# Patient Record
Sex: Female | Born: 1945 | Race: Black or African American | Hispanic: No | Smoking: Former smoker
Health system: Southern US, Community
[De-identification: ages and names within clinical notes are randomized; demographics above are authoritative.]

## PROBLEM LIST (undated history)

## (undated) DIAGNOSIS — M549 Dorsalgia, unspecified: Secondary | ICD-10-CM

## (undated) DIAGNOSIS — I480 Paroxysmal atrial fibrillation: Secondary | ICD-10-CM

## (undated) DIAGNOSIS — M2550 Pain in unspecified joint: Secondary | ICD-10-CM

## (undated) DIAGNOSIS — I509 Heart failure, unspecified: Secondary | ICD-10-CM

## (undated) DIAGNOSIS — D860 Sarcoidosis of lung: Secondary | ICD-10-CM

## (undated) DIAGNOSIS — I4719 Other supraventricular tachycardia: Secondary | ICD-10-CM

## (undated) DIAGNOSIS — A159 Respiratory tuberculosis unspecified: Secondary | ICD-10-CM

## (undated) DIAGNOSIS — K5900 Constipation, unspecified: Secondary | ICD-10-CM

## (undated) DIAGNOSIS — J449 Chronic obstructive pulmonary disease, unspecified: Secondary | ICD-10-CM

## (undated) DIAGNOSIS — K259 Gastric ulcer, unspecified as acute or chronic, without hemorrhage or perforation: Secondary | ICD-10-CM

## (undated) DIAGNOSIS — R0600 Dyspnea, unspecified: Secondary | ICD-10-CM

## (undated) DIAGNOSIS — N183 Chronic kidney disease, stage 3 unspecified: Secondary | ICD-10-CM

## (undated) DIAGNOSIS — I442 Atrioventricular block, complete: Secondary | ICD-10-CM

## (undated) DIAGNOSIS — E785 Hyperlipidemia, unspecified: Secondary | ICD-10-CM

## (undated) DIAGNOSIS — I10 Essential (primary) hypertension: Secondary | ICD-10-CM

## (undated) DIAGNOSIS — K5790 Diverticulosis of intestine, part unspecified, without perforation or abscess without bleeding: Secondary | ICD-10-CM

## (undated) DIAGNOSIS — I471 Supraventricular tachycardia: Secondary | ICD-10-CM

## (undated) DIAGNOSIS — H209 Unspecified iridocyclitis: Secondary | ICD-10-CM

## (undated) DIAGNOSIS — D649 Anemia, unspecified: Secondary | ICD-10-CM

## (undated) DIAGNOSIS — I639 Cerebral infarction, unspecified: Secondary | ICD-10-CM

## (undated) DIAGNOSIS — Z950 Presence of cardiac pacemaker: Secondary | ICD-10-CM

## (undated) DIAGNOSIS — H269 Unspecified cataract: Secondary | ICD-10-CM

## (undated) DIAGNOSIS — R600 Localized edema: Secondary | ICD-10-CM

## (undated) DIAGNOSIS — G4733 Obstructive sleep apnea (adult) (pediatric): Secondary | ICD-10-CM

## (undated) DIAGNOSIS — I00 Rheumatic fever without heart involvement: Secondary | ICD-10-CM

## (undated) DIAGNOSIS — M069 Rheumatoid arthritis, unspecified: Secondary | ICD-10-CM

## (undated) DIAGNOSIS — I428 Other cardiomyopathies: Secondary | ICD-10-CM

## (undated) DIAGNOSIS — M1990 Unspecified osteoarthritis, unspecified site: Secondary | ICD-10-CM

## (undated) DIAGNOSIS — I429 Cardiomyopathy, unspecified: Secondary | ICD-10-CM

## (undated) DIAGNOSIS — R55 Syncope and collapse: Secondary | ICD-10-CM

## (undated) HISTORY — DX: Cardiomyopathy, unspecified: I42.9

## (undated) HISTORY — DX: Anemia, unspecified: D64.9

## (undated) HISTORY — DX: Rheumatic fever without heart involvement: I00

## (undated) HISTORY — DX: Hyperlipidemia, unspecified: E78.5

## (undated) HISTORY — DX: Other supraventricular tachycardia: I47.19

## (undated) HISTORY — PX: EYE SURGERY: SHX253

## (undated) HISTORY — DX: Pain in unspecified joint: M25.50

## (undated) HISTORY — DX: Other cardiomyopathies: I42.8

## (undated) HISTORY — PX: INSERT / REPLACE / REMOVE PACEMAKER: SUR710

## (undated) HISTORY — DX: Supraventricular tachycardia: I47.1

## (undated) HISTORY — DX: Obstructive sleep apnea (adult) (pediatric): G47.33

## (undated) HISTORY — PX: GLAUCOMA SURGERY: SHX656

## (undated) HISTORY — DX: Atrioventricular block, complete: I44.2

## (undated) HISTORY — DX: Diverticulosis of intestine, part unspecified, without perforation or abscess without bleeding: K57.90

## (undated) HISTORY — DX: Unspecified cataract: H26.9

## (undated) HISTORY — DX: Rheumatoid arthritis, unspecified: M06.9

## (undated) HISTORY — DX: Syncope and collapse: R55

## (undated) HISTORY — DX: Unspecified iridocyclitis: H20.9

## (undated) HISTORY — DX: Dyspnea, unspecified: R06.00

## (undated) HISTORY — DX: Constipation, unspecified: K59.00

## (undated) HISTORY — PX: CARDIAC CATHETERIZATION: SHX172

## (undated) HISTORY — PX: VAGINAL HYSTERECTOMY: SUR661

## (undated) HISTORY — DX: Gastric ulcer, unspecified as acute or chronic, without hemorrhage or perforation: K25.9

## (undated) HISTORY — DX: Localized edema: R60.0

## (undated) HISTORY — PX: TUBAL LIGATION: SHX77

## (undated) HISTORY — DX: Presence of cardiac pacemaker: Z95.0

## (undated) HISTORY — DX: Essential (primary) hypertension: I10

## (undated) HISTORY — DX: Chronic obstructive pulmonary disease, unspecified: J44.9

## (undated) HISTORY — DX: Dorsalgia, unspecified: M54.9

## (undated) HISTORY — DX: Paroxysmal atrial fibrillation: I48.0

---

## 1998-01-13 ENCOUNTER — Inpatient Hospital Stay (HOSPITAL_COMMUNITY): Admission: EM | Admit: 1998-01-13 | Discharge: 1998-01-18 | Payer: Self-pay | Admitting: Emergency Medicine

## 2002-10-10 ENCOUNTER — Encounter: Payer: Self-pay | Admitting: Family Medicine

## 2002-10-10 ENCOUNTER — Ambulatory Visit: Admission: RE | Admit: 2002-10-10 | Discharge: 2002-10-10 | Payer: Self-pay | Admitting: Family Medicine

## 2003-11-06 ENCOUNTER — Other Ambulatory Visit: Admission: RE | Admit: 2003-11-06 | Discharge: 2003-11-06 | Payer: Self-pay | Admitting: Family Medicine

## 2004-11-08 ENCOUNTER — Ambulatory Visit: Payer: Self-pay

## 2004-11-11 ENCOUNTER — Ambulatory Visit: Payer: Self-pay

## 2004-11-18 ENCOUNTER — Ambulatory Visit: Payer: Self-pay | Admitting: Cardiology

## 2004-11-19 ENCOUNTER — Ambulatory Visit: Payer: Self-pay

## 2004-11-25 ENCOUNTER — Ambulatory Visit: Payer: Self-pay | Admitting: *Deleted

## 2004-11-28 ENCOUNTER — Ambulatory Visit: Payer: Self-pay | Admitting: Internal Medicine

## 2004-11-29 ENCOUNTER — Ambulatory Visit: Payer: Self-pay | Admitting: *Deleted

## 2004-12-03 ENCOUNTER — Inpatient Hospital Stay (HOSPITAL_BASED_OUTPATIENT_CLINIC_OR_DEPARTMENT_OTHER): Admission: RE | Admit: 2004-12-03 | Discharge: 2004-12-03 | Payer: Self-pay | Admitting: *Deleted

## 2004-12-03 ENCOUNTER — Ambulatory Visit: Payer: Self-pay | Admitting: *Deleted

## 2004-12-10 ENCOUNTER — Ambulatory Visit: Payer: Self-pay | Admitting: Cardiovascular Disease

## 2004-12-17 ENCOUNTER — Ambulatory Visit: Payer: Self-pay | Admitting: Internal Medicine

## 2004-12-27 ENCOUNTER — Ambulatory Visit: Payer: Self-pay | Admitting: Internal Medicine

## 2005-01-01 ENCOUNTER — Ambulatory Visit: Payer: Self-pay | Admitting: Internal Medicine

## 2005-01-03 ENCOUNTER — Ambulatory Visit: Payer: Self-pay | Admitting: Cardiology

## 2005-01-09 ENCOUNTER — Ambulatory Visit: Payer: Self-pay | Admitting: Family Medicine

## 2005-01-10 ENCOUNTER — Ambulatory Visit: Payer: Self-pay | Admitting: Internal Medicine

## 2005-01-15 ENCOUNTER — Ambulatory Visit (HOSPITAL_COMMUNITY): Admission: RE | Admit: 2005-01-15 | Discharge: 2005-01-15 | Payer: Self-pay | Admitting: Internal Medicine

## 2005-01-15 ENCOUNTER — Ambulatory Visit: Payer: Self-pay | Admitting: Internal Medicine

## 2005-01-17 ENCOUNTER — Ambulatory Visit (HOSPITAL_COMMUNITY): Admission: RE | Admit: 2005-01-17 | Discharge: 2005-01-17 | Payer: Self-pay | Admitting: Internal Medicine

## 2005-01-17 ENCOUNTER — Ambulatory Visit: Payer: Self-pay | Admitting: Internal Medicine

## 2005-01-22 ENCOUNTER — Ambulatory Visit: Payer: Self-pay | Admitting: Internal Medicine

## 2005-01-29 ENCOUNTER — Ambulatory Visit: Payer: Self-pay | Admitting: Cardiology

## 2005-02-05 ENCOUNTER — Ambulatory Visit: Payer: Self-pay | Admitting: *Deleted

## 2005-02-10 ENCOUNTER — Ambulatory Visit: Payer: Self-pay | Admitting: *Deleted

## 2005-02-10 ENCOUNTER — Ambulatory Visit: Payer: Self-pay | Admitting: Internal Medicine

## 2005-02-14 ENCOUNTER — Ambulatory Visit: Payer: Self-pay | Admitting: Cardiology

## 2005-02-14 ENCOUNTER — Ambulatory Visit: Payer: Self-pay | Admitting: Internal Medicine

## 2005-02-17 ENCOUNTER — Ambulatory Visit (HOSPITAL_COMMUNITY): Admission: RE | Admit: 2005-02-17 | Discharge: 2005-02-18 | Payer: Self-pay | Admitting: Internal Medicine

## 2005-02-18 ENCOUNTER — Ambulatory Visit: Payer: Self-pay | Admitting: Internal Medicine

## 2005-02-24 ENCOUNTER — Ambulatory Visit: Payer: Self-pay | Admitting: Cardiology

## 2005-03-04 ENCOUNTER — Ambulatory Visit: Payer: Self-pay | Admitting: Internal Medicine

## 2005-03-24 ENCOUNTER — Ambulatory Visit: Payer: Self-pay | Admitting: Internal Medicine

## 2005-03-24 ENCOUNTER — Ambulatory Visit: Payer: Self-pay | Admitting: Cardiology

## 2005-04-10 ENCOUNTER — Ambulatory Visit: Payer: Self-pay | Admitting: Family Medicine

## 2005-04-11 ENCOUNTER — Ambulatory Visit (HOSPITAL_COMMUNITY): Admission: RE | Admit: 2005-04-11 | Discharge: 2005-04-11 | Payer: Self-pay | Admitting: Family Medicine

## 2005-04-14 ENCOUNTER — Ambulatory Visit: Payer: Self-pay | Admitting: Internal Medicine

## 2005-04-14 ENCOUNTER — Ambulatory Visit: Payer: Self-pay | Admitting: Cardiology

## 2005-04-23 ENCOUNTER — Ambulatory Visit: Payer: Self-pay | Admitting: Internal Medicine

## 2005-04-25 ENCOUNTER — Ambulatory Visit (HOSPITAL_COMMUNITY): Admission: RE | Admit: 2005-04-25 | Discharge: 2005-04-25 | Payer: Self-pay | Admitting: Internal Medicine

## 2005-05-12 ENCOUNTER — Ambulatory Visit: Payer: Self-pay | Admitting: Internal Medicine

## 2005-05-12 ENCOUNTER — Ambulatory Visit: Payer: Self-pay | Admitting: Cardiology

## 2005-05-21 ENCOUNTER — Ambulatory Visit: Payer: Self-pay | Admitting: Cardiology

## 2005-05-28 ENCOUNTER — Ambulatory Visit: Payer: Self-pay | Admitting: Cardiology

## 2005-06-05 ENCOUNTER — Ambulatory Visit: Payer: Self-pay | Admitting: Internal Medicine

## 2005-06-07 ENCOUNTER — Emergency Department (HOSPITAL_COMMUNITY): Admission: EM | Admit: 2005-06-07 | Discharge: 2005-06-07 | Payer: Self-pay | Admitting: Emergency Medicine

## 2005-06-11 ENCOUNTER — Ambulatory Visit: Payer: Self-pay | Admitting: Cardiology

## 2005-06-25 ENCOUNTER — Ambulatory Visit: Payer: Self-pay | Admitting: Cardiology

## 2005-07-14 ENCOUNTER — Ambulatory Visit: Payer: Self-pay

## 2005-07-16 ENCOUNTER — Ambulatory Visit: Payer: Self-pay | Admitting: Cardiology

## 2005-08-13 ENCOUNTER — Ambulatory Visit: Payer: Self-pay | Admitting: Cardiology

## 2005-08-27 ENCOUNTER — Ambulatory Visit: Payer: Self-pay | Admitting: Cardiology

## 2005-09-08 ENCOUNTER — Ambulatory Visit: Payer: Self-pay | Admitting: Cardiology

## 2005-09-22 ENCOUNTER — Ambulatory Visit: Payer: Self-pay | Admitting: Cardiology

## 2005-10-07 ENCOUNTER — Ambulatory Visit: Payer: Self-pay | Admitting: *Deleted

## 2005-10-28 ENCOUNTER — Ambulatory Visit: Payer: Self-pay | Admitting: Cardiology

## 2005-11-07 ENCOUNTER — Ambulatory Visit: Payer: Self-pay | Admitting: Cardiology

## 2005-11-26 ENCOUNTER — Ambulatory Visit: Payer: Self-pay | Admitting: Cardiology

## 2005-11-28 ENCOUNTER — Emergency Department (HOSPITAL_COMMUNITY): Admission: EM | Admit: 2005-11-28 | Discharge: 2005-11-28 | Payer: Self-pay | Admitting: Emergency Medicine

## 2005-12-24 ENCOUNTER — Ambulatory Visit: Payer: Self-pay | Admitting: Cardiology

## 2006-01-01 ENCOUNTER — Ambulatory Visit: Payer: Self-pay | Admitting: Internal Medicine

## 2006-06-22 ENCOUNTER — Ambulatory Visit: Payer: Self-pay | Admitting: Internal Medicine

## 2006-11-27 ENCOUNTER — Ambulatory Visit: Payer: Self-pay | Admitting: Internal Medicine

## 2007-11-12 ENCOUNTER — Ambulatory Visit: Payer: Self-pay | Admitting: Internal Medicine

## 2008-01-17 ENCOUNTER — Encounter (INDEPENDENT_AMBULATORY_CARE_PROVIDER_SITE_OTHER): Payer: Self-pay | Admitting: Internal Medicine

## 2008-02-09 ENCOUNTER — Other Ambulatory Visit: Admission: RE | Admit: 2008-02-09 | Discharge: 2008-02-09 | Payer: Self-pay | Admitting: Family Medicine

## 2008-05-24 ENCOUNTER — Ambulatory Visit: Payer: Self-pay | Admitting: Cardiology

## 2008-12-07 ENCOUNTER — Encounter: Payer: Self-pay | Admitting: Internal Medicine

## 2008-12-12 ENCOUNTER — Ambulatory Visit: Payer: Self-pay | Admitting: Internal Medicine

## 2008-12-12 LAB — CONVERTED CEMR LAB
BUN: 19 mg/dL (ref 6–23)
Basophils Absolute: 0 10*3/uL (ref 0.0–0.1)
Basophils Relative: 0.9 % (ref 0.0–3.0)
CO2: 27 meq/L (ref 19–32)
Calcium: 9.5 mg/dL (ref 8.4–10.5)
Chloride: 106 meq/L (ref 96–112)
Creatinine, Ser: 1.2 mg/dL (ref 0.4–1.2)
Eosinophils Absolute: 0.2 10*3/uL (ref 0.0–0.7)
Eosinophils Relative: 5.2 % — ABNORMAL HIGH (ref 0.0–5.0)
GFR calc Af Amer: 59 mL/min
GFR calc non Af Amer: 49 mL/min
Glucose, Bld: 81 mg/dL (ref 70–99)
HCT: 31.7 % — ABNORMAL LOW (ref 36.0–46.0)
Hemoglobin: 10.9 g/dL — ABNORMAL LOW (ref 12.0–15.0)
Lymphocytes Relative: 24.9 % (ref 12.0–46.0)
MCHC: 34.5 g/dL (ref 30.0–36.0)
MCV: 89.5 fL (ref 78.0–100.0)
Monocytes Absolute: 0.4 10*3/uL (ref 0.1–1.0)
Monocytes Relative: 10.5 % (ref 3.0–12.0)
Neutro Abs: 2 10*3/uL (ref 1.4–7.7)
Neutrophils Relative %: 58.5 % (ref 43.0–77.0)
Platelets: 196 10*3/uL (ref 150–400)
Potassium: 4.2 meq/L (ref 3.5–5.1)
Pro B Natriuretic peptide (BNP): 139 pg/mL — ABNORMAL HIGH (ref 0.0–100.0)
RBC: 3.54 M/uL — ABNORMAL LOW (ref 3.87–5.11)
RDW: 12.7 % (ref 11.5–14.6)
Sodium: 140 meq/L (ref 135–145)
WBC: 3.4 10*3/uL — ABNORMAL LOW (ref 4.5–10.5)

## 2009-03-20 ENCOUNTER — Ambulatory Visit: Payer: Self-pay | Admitting: Internal Medicine

## 2009-03-20 DIAGNOSIS — I495 Sick sinus syndrome: Secondary | ICD-10-CM

## 2009-09-14 ENCOUNTER — Encounter: Payer: Self-pay | Admitting: Internal Medicine

## 2009-09-14 ENCOUNTER — Ambulatory Visit: Payer: Self-pay | Admitting: Internal Medicine

## 2009-09-14 DIAGNOSIS — I5022 Chronic systolic (congestive) heart failure: Secondary | ICD-10-CM

## 2009-09-18 ENCOUNTER — Encounter: Payer: Self-pay | Admitting: Internal Medicine

## 2009-10-09 ENCOUNTER — Ambulatory Visit (HOSPITAL_COMMUNITY): Admission: RE | Admit: 2009-10-09 | Discharge: 2009-10-09 | Payer: Self-pay | Admitting: Internal Medicine

## 2009-10-09 ENCOUNTER — Ambulatory Visit: Payer: Self-pay | Admitting: Internal Medicine

## 2009-10-09 ENCOUNTER — Ambulatory Visit: Payer: Self-pay

## 2009-10-09 ENCOUNTER — Ambulatory Visit: Payer: Self-pay | Admitting: Cardiovascular Disease

## 2009-10-09 ENCOUNTER — Encounter: Payer: Self-pay | Admitting: Internal Medicine

## 2009-10-09 DIAGNOSIS — I442 Atrioventricular block, complete: Secondary | ICD-10-CM | POA: Insufficient documentation

## 2009-10-12 ENCOUNTER — Telehealth: Payer: Self-pay | Admitting: Internal Medicine

## 2009-10-31 ENCOUNTER — Telehealth (INDEPENDENT_AMBULATORY_CARE_PROVIDER_SITE_OTHER): Payer: Self-pay | Admitting: *Deleted

## 2009-11-01 ENCOUNTER — Ambulatory Visit: Payer: Self-pay | Admitting: Internal Medicine

## 2009-11-01 LAB — CONVERTED CEMR LAB
GFR calc non Af Amer: 53.49 mL/min (ref >60)
Magnesium: 1.8 mg/dL (ref 1.5–2.5)
Potassium: 4.3 meq/L (ref 3.5–5.1)
Sodium: 141 meq/L (ref 135–145)

## 2010-05-01 ENCOUNTER — Encounter (INDEPENDENT_AMBULATORY_CARE_PROVIDER_SITE_OTHER): Payer: Self-pay | Admitting: *Deleted

## 2010-05-09 ENCOUNTER — Encounter: Payer: Self-pay | Admitting: Internal Medicine

## 2010-05-09 ENCOUNTER — Ambulatory Visit: Payer: Self-pay

## 2010-08-17 ENCOUNTER — Emergency Department (HOSPITAL_COMMUNITY): Admission: EM | Admit: 2010-08-17 | Discharge: 2010-08-17 | Payer: Self-pay | Admitting: Emergency Medicine

## 2010-11-05 NOTE — Progress Notes (Signed)
   Walk in Patient Form Recieved " Pt. Medicine making her have leg cramps what else can she take?" forwarded to [facility]  October 31, 2009 2:17 PM

## 2010-11-05 NOTE — Letter (Signed)
Summary: Device-Delinquent Check  [facility], Main Office  [address]   [place], [place] [address]   Phone: [phone]  Fax: [phone]     May 01, 2010 MRN: [id number]   [name] [address] [place], [place]  [address]   Dear [name],  According to our records, you have not had your implanted device checked in the recommended period of time.  We are unable to determine appropriate device function without checking your device on a regular basis.  Please call our office to schedule an appointment as soon as possible.  If you are having your device checked by another physician, please call us so that we may update our records.  Thank you,   [facility]

## 2010-11-05 NOTE — Cardiovascular Report (Signed)
Summary: Office Visit   Office Visit   Imported By: Roderic Ovens 05/22/2010 11:33:30  _____________________________________________________________________  External Attachment:    Type:   Image     Comment:   External Document

## 2010-11-05 NOTE — Procedures (Signed)
Summary: pacer check/lg  Medications Added * CRESTOR 10 MG TABS (ROSUVASTATIN CALCIUM) take one tablet at bedtime TRAVATAN Z 0.004 % SOLN (TRAVOPROST) one drop in the right eye daily ASPIRIN 81 MG TBEC (ASPIRIN) one by mouth daily FISH OIL 1000 MG CAPS (OMEGA-3 FATTY ACIDS) one by mouth two times a day LOTEMAX 0.5 % SUSP (LOTEPREDNOL ETABONATE) one drop both eyes two times a day      Allergies Added:   Current Medications (verified): 1)  Crestor 10 Mg Tabs (Rosuvastatin Calcium) .... Take One Tablet At Bedtime 2)  Caltrate 600 1500 Mg Tabs (Calcium Carbonate) .... Take One Tablet Two Times A Day 3)  Coreg 25 Mg Tabs (Carvedilol) .... Two Times A Day 4)  Travatan Z 0.004 % Soln (Travoprost) .... One Drop in The Right Eye Daily 5)  Spironolactone 25 Mg Tabs (Spironolactone) .... Take 1/2 Tablet Daily 6)  Losartan Potassium 25 Mg Tabs (Losartan Potassium) .... 1/2 Tablet Daily 7)  Aspirin 81 Mg Tbec (Aspirin) .... One By Mouth Daily 8)  Fish Oil 1000 Mg Caps (Omega-3 Fatty Acids) .... One By Mouth Two Times A Day 9)  Lotemax 0.5 % Susp (Loteprednol Etabonate) .... One Drop Both Eyes Two Times A Day  Allergies (verified): 1)  ! Codeine 2)  ! Penicillin  PPM Specifications Following MD:  Sherryl Manges, MD     PPM Vendor:  St Jude     PPM Model Number:  [id number]     PPM Serial Number:  [id number] PPM DOI:  01/17/2005     PPM Implanting MD:  Sherryl Manges, MD  Lead 1    Location: RA     DOI: 01/17/1998     Model #: 1342T     Serial #: [id number]     Status: active Lead 2    Location: RV     DOI: 01/17/1998     Model #: [id number]     Serial #: [id number]     Status: active  Magnet Response Rate:  BOL 98.6 ERI  86.3  Indications:  NICM; A-flutter   PPM Follow Up Remote Check?  No Battery Voltage:  2.78 V     Battery Est. Longevity:  4 years     Pacer Dependent:  Yes       PPM Device Measurements Atrium  Impedance: 450 ohms, Threshold: 0.75 V at 0.5 msec Right Ventricle  Impedance: 334 ohms,  Threshold: 1.25 V at 0.5 msec  Episodes MS Episodes:  197     Percent Mode Switch:  <1%     Coumadin:  No  Parameters Mode:  DDIR     Lower Rate Limit:  60     Upper Rate Limit:  160 Paced AV Delay:  160     Next Cardiology Appt Due:  11/06/2010 Tech Comments:  AT/AF burden <1% longest episode 1:48 minutes.  No parameter changes.  ROV 6 months with Dr. Graciela Husbands. Altha Harm, LPN  May 09, 2010 4:03 PM

## 2010-11-05 NOTE — Progress Notes (Signed)
   Phone Note Outgoing Call   Call placed by: Duncan Dull, RN, BSN,  October 31, 2009 4:12 PM Call placed to: Patient Summary of Call: Pt walked in to talk to me about having leg cramps and HA's pretty much since her medications were changed on 10/09/09. I called Dr. Katrinka Blazing who she saw after [place]. Her K+ was 4.5 on the 10th. They did not check a Mg. I ask her to come in tomorrow to have labs done and we will adjust her medication accordingly. Her phone number is [phone].  Initial call taken by: Duncan Dull, RN, BSN,  October 31, 2009 4:20 PM  Follow-up for Phone Call        Unable to reach pt. Left message on machine. To advise her labs are fine. Per Dr. Graciela Husbands, she should f/u with PCP. To call me with any further questions Duncan Dull, RN, BSN  November 01, 2009 6:15 PM

## 2010-11-05 NOTE — Progress Notes (Signed)
Summary:  CALLING ABOUT MEDICATION   Phone Note Call from Patient Call back at [phone]   Caller: Patient Summary of Call: PT CALLING ABOUT MEDICATIONS Initial call taken by: Judie Grieve,  October 12, 2009 2:20 PM  Follow-up for Phone Call        Unable to reach pt. No answer. No VM.  Duncan Dull, RN, BSN  October 12, 2009 3:09 PM

## 2010-11-05 NOTE — Assessment & Plan Note (Signed)
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    We saw her last month ago she was complaining of shortness of breath with nocturnal dyspnea. We began her on a diuretic and she is much improved.  It was interesting that her BNP was normal. Her chest x-ray showed mild interstitial edema.  Her intercurrent evaluation LV systolic function by cardiac echo was stable at about 35-40%.     Current Medications (verified): 1)  Crestor 20 Mg Tabs (Rosuvastatin Calcium) .... Take One Tablet At Bedtime 2)  Caltrate 600 1500 Mg Tabs (Calcium Carbonate) .... Take One Tablet Two Times A Day 3)  Coreg 25 Mg Tabs (Carvedilol) .... Two Times A Day 4)  Flaxseed Oil 1000 Mg Caps (Flaxseed (Linseed)) .... Once Daily 5)  Travatan Z 0.004 % Soln (Travoprost) .... At Bedtime 6)  Lasix 20 Mg Tabs (Furosemide) .... Once Daily 7)  Lotensin 10 Mg Tabs (Benazepril Hcl) .... Take 1/4 of A Tablet Daily  Allergies (verified): 1)  ! Codeine 2)  ! 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  Family History: Last updated: 2009/04/15 Both mother and father died relatively early with heart problems.  She has nine siblings.  There is heart disease and diabetes among them.  Social  History: Last updated: 04-15-2009 Retired  Married  Tobacco Use - No.  Alcohol Use - no Drug Use - no  Vital Signs:  Patient profile:   65 year old female Height:      63 inches Weight:      186 pounds BMI:     33.07 Pulse rate:   68 / minute Pulse rhythm:   regular BP sitting:   117 / 82  (left arm) Cuff size:   regular  Vitals Entered By: Judithe Modest CMA (October 09, 2009 12:55 PM)  Physical Exam  General:  The patient was alert and oriented in no acute distress. HEENT Normal.  Neck veins were flat, carotids were brisk.  Lungs were clear.  Heart sounds were regular without murmurs or gallops.  Abdomen was soft with active bowel sounds. There is no clubbing cyanosis or edema. Skin Warm and dry    PPM Specifications Following MD:  Sherryl Manges, MD     PPM Vendor:  [employer] Jude     PPM Model Number:  [id number]     PPM Serial Number:  [id number] PPM DOI:  01/17/2005     PPM Implanting MD:  Sherryl Manges, MD  Lead 1    Location: RA     DOI: 01/17/1998     Model #: 1342T     Serial #: [id number]     Status: active Lead 2    Location: RV     DOI: 01/17/1998     Model #: 1388T     Serial #: [id number]  Status: active  Magnet Response Rate:  BOL 98.6 ERI  86.3  Indications:  NICM; A-flutter   PPM Follow Up Remote Check?  No Pacer Dependent:  Yes      Parameters Mode:  DDIR     Lower Rate Limit:  60     Upper Rate Limit:  160 Paced AV Delay:  160     Tech Comments:  Interrogatoin only, heart rate histagrams appropriate.  Pt reports breathing is a little better from last visit. Gypsy Balsam RN BSN  October 09, 2009 1:00 PM   Impression & Recommendations:  Problem # 1:  SYSTOLIC HEART FAILURE, ACUTE ON CHRONIC (ICD-428.23) she remains much improved.  We'll plan to check a metabolic profile today. Also based on the emphasis trial, it is reasonable to begin her on Aldactone. This will require [place] to recheck a metabolic profile in 2 weeks so on second thought I 'll skip the one today.in  addition, plan to discontinue her Lasix and will use Aldactone as her diuretic and see how she does. The following medications were removed from the medication list:    Lasix 20 Mg Tabs (Furosemide) ..... Once daily    Lotensin 10 Mg Tabs (Benazepril hcl) .Marland Kitchen... Take 1/4 of a tablet daily Her updated medication list for this problem includes:    Coreg 25 Mg Tabs (Carvedilol) .Marland Kitchen..Marland Kitchen Two times a day    Spironolactone 25 Mg Tabs (Spironolactone) .Marland Kitchen... Take 1/2 tablet daily    Losartan Potassium 25 Mg Tabs (Losartan potassium) .Marland Kitchen... 1/2 tablet daily    Aggrenox 25-200 Mg Xr12h-cap (Aspirin-dipyridamole) .Marland Kitchen... 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 Once daily    Lotensin 10 Mg Tabs (Benazepril hcl) .Marland Kitchen... Take 1/4 of a tablet daily Her updated medication list for this problem includes:    Coreg 25 Mg Tabs (Carvedilol) .Marland Kitchen..Marland Kitchen Two times a day    Spironolactone 25 Mg Tabs (Spironolactone) .Marland Kitchen... Take 1/2 tablet daily    Losartan Potassium 25 Mg Tabs (Losartan potassium) .Marland Kitchen... 1/2 tablet daily    Aggrenox 25-200 Mg Xr12h-cap (Aspirin-dipyridamole) .Marland Kitchen... Take one capsule by mouth twice a day  Problem # 3:  PACEMAKER, PERMANENT--ST. JUDE IDENTITY 5386 (ICD-V45.01) normal device function with reasonable heart rate excursion  Problem # 5:  COUGH (ICD-786.2)  Will plan to discontinue her Lotensin is maybe contributing to her cough and begin her on generic VLosartan The following medications were removed from the medication list:    Lotensin 10 Mg Tabs (Benazepril hcl) .Marland Kitchen... Take 1/4 of a tablet daily Her updated medication list for this problem includes:    Coreg 25 Mg Tabs (Carvedilol) .Marland Kitchen..Marland Kitchen Two times a day    Aggrenox 25-200 Mg Xr12h-cap (Aspirin-dipyridamole) .Marland Kitchen... Take one capsule by mouth twice a day  Her updated  medication list for this problem includes:    Coreg 25 Mg Tabs (Carvedilol) .Marland Kitchen..Marland Kitchen Two times a day    Lotensin 10 Mg Tabs (Benazepril hcl) .Marland Kitchen... Take 1/4 of a tablet daily  Patient Instructions: 1)  Your physician has recommended you make the following change in your medication: STOP LASIX. START SPIRONOLACTONE 12.5MG  DAILY. YOUR LOTENSIN HAS CHANGED TO LOSARTAN 25MG  (TAKE 1/2 TABLET).  Prescriptions: LOSARTAN POTASSIUM 25 MG TABS (LOSARTAN POTASSIUM) 1/2 TABLET DAILY  #15 x 6   Entered by:   Shawna Orleans  Sherral Hammers, RN, BSN   Authorized by:   Nathen May, MD, [facility]   Signed by:   Duncan Dull, RN, BSN on 10/09/2009   Method used:   Electronically to        [facility] [id number] (retail)       [address] [place], [place]  [address]       Ph: [phone]       Fax: [phone]   RxID:   [id number] SPIRONOLACTONE 25 MG TABS (SPIRONOLACTONE) TAKE 1/2 TABLET DAILY  #30 x 6   Entered by:   Duncan Dull, RN, BSN   Authorized by:   Nathen May, MD, [facility]   Signed by:   Duncan Dull, RN, BSN on 10/09/2009   Method used:   Electronically to        [facility] [id number] (retail)       [address] [place], [place]  [address]       Ph: [phone]       Fax: [phone]   RxID:   [id number]

## 2010-11-28 ENCOUNTER — Ambulatory Visit (INDEPENDENT_AMBULATORY_CARE_PROVIDER_SITE_OTHER): Payer: PRIVATE HEALTH INSURANCE

## 2010-11-28 ENCOUNTER — Inpatient Hospital Stay (INDEPENDENT_AMBULATORY_CARE_PROVIDER_SITE_OTHER)
Admission: RE | Admit: 2010-11-28 | Discharge: 2010-11-28 | Disposition: A | Discharge status: Home / Self Care | Payer: PRIVATE HEALTH INSURANCE | Source: Ambulatory Visit | Attending: Family Medicine | Admitting: Family Medicine

## 2010-11-28 DIAGNOSIS — R071 Chest pain on breathing: Secondary | ICD-10-CM

## 2010-11-28 DIAGNOSIS — R05 Cough: Secondary | ICD-10-CM

## 2010-11-28 DIAGNOSIS — R059 Cough, unspecified: Secondary | ICD-10-CM

## 2010-12-10 ENCOUNTER — Encounter: Payer: Self-pay | Admitting: Internal Medicine

## 2010-12-10 ENCOUNTER — Encounter (INDEPENDENT_AMBULATORY_CARE_PROVIDER_SITE_OTHER): Payer: PRIVATE HEALTH INSURANCE | Admitting: Internal Medicine

## 2010-12-10 DIAGNOSIS — I498 Other specified cardiac arrhythmias: Secondary | ICD-10-CM

## 2010-12-10 DIAGNOSIS — I442 Atrioventricular block, complete: Secondary | ICD-10-CM

## 2010-12-10 DIAGNOSIS — Z950 Presence of cardiac pacemaker: Secondary | ICD-10-CM

## 2010-12-10 DIAGNOSIS — R0602 Shortness of breath: Secondary | ICD-10-CM

## 2010-12-17 NOTE — Assessment & Plan Note (Signed)
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    We saw her last month ago she was complaining of shortness of breath with nocturnal dyspnea. We began her on a diuretic and she is much improved.  It was interesting that her BNP was normal. Her chest x-ray showed mild interstitial edema.  Her intercurrent evaluation LV systolic function by cardiac echo (January 2011)was stable at about 35-40%.  Her dyspnea has rec3ently gottne worse but htis occurs in the context of fever and cough and not assoc with edema     Current Medications (verified): 1)  Crestor 10 Mg Tabs (Rosuvastatin Calcium) .... Take One Tablet At Bedtime 2)  Caltrate 600 1500 Mg Tabs (Calcium Carbonate) .... Take One Tablet Two Times A Day 3)  Coreg 25 Mg Tabs (Carvedilol) .... Two Times A Day 4)  Lumigan 0.01 % Soln (Bimatoprost) .... Uad 5)  Spironolactone 25 Mg Tabs (Spironolactone) .... Take 1/2 Tablet Daily 6)  Losartan Potassium 25 Mg Tabs (Losartan Potassium) .... 1/2 Tablet Daily 7)  Aspirin 81 Mg Tbec (Aspirin) .... One By Mouth Daily 8)  Fish Oil 1000 Mg Caps (Omega-3 Fatty Acids) .... One By Mouth Two Times A Day 9)  Lotemax 0.5 % Susp (Loteprednol Etabonate) .... One Drop Both Eyes Three Times A Day  Allergies: 1)  ! Codeine 2)  ! 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 Atrioventricular node ablation St.  Jude model #5380, Identify.  Vital Signs:  Patient profile:   66 year old female Height:      63 inches Weight:      192 pounds BMI:     34.13 Pulse rate:   68 / minute BP sitting:   92 / 62  (right arm)  Vitals Entered By: Laurance Flatten CMA (December 10, 2010 3:03 PM)  Physical Exam  General:  The patient was alert and oriented in no acute distress. HEENT Normal.  Neck veins were flat, carotids were brisk.  Lungs were clear.  Heart sounds were regular without murmurs or gallops.  Abdomen was soft with active bowel sounds. There is no clubbing cyanosis or edema. Skin Warm and dry device pocket was well-healed   PPM Specifications Following MD:  Sherryl Manges, MD     PPM Vendor:  St Jude     PPM Model Number:  [id number]     PPM Serial Number:  [id number] PPM DOI:  01/17/2005     PPM Implanting MD:  Sherryl Manges, MD  Lead 1    Location: RA     DOI: 01/17/1998     Model #: 1342T     Serial #: [id number]     Status: active Lead 2    Location: RV     DOI: 01/17/1998     Model #: 1388T     Serial #: [id number]     Status: active  Magnet Response Rate:  BOL 98.6 ERI  86.3  Indications:  NICM; A-flutter   PPM Follow Up Pacer Dependent:  Yes      Episodes Coumadin:  No  Parameters Mode:  DDIR     Lower Rate Limit:  60     Upper Rate Limit:  160 Paced AV Delay:  160     Impression & Recommendations:  Problem # 1:  AV BLOCK, COMPLETE (ICD-426.0)  stable post pacing Her updated medication list for this problem includes:    Coreg 25 Mg Tabs (Carvedilol) .Marland Kitchen..Marland Kitchen Two times a day    Aspirin 81 Mg Tbec (Aspirin) ..... One by mouth daily  Her updated medication list for this problem includes:    Coreg 25 Mg Tabs (Carvedilol) .Marland Kitchen..Marland Kitchen Two times a day    Aspirin 81 Mg Tbec (Aspirin) ..... One by mouth daily  Problem # 2:  SYSTOLIC HEART FAILURE, CHRONIC (ICD-428.22) we'll continue her on her current medicines. Certainly at the time of device generator replacement and perhaps before consideration  should be given for CRT upgrade in the event that her symptoms are not really class 1-2 a Her updated medication list for this problem includes:    Coreg 25 Mg Tabs (Carvedilol) .Marland Kitchen..Marland Kitchen Two times a day    Spironolactone 25 Mg Tabs (Spironolactone) .Marland Kitchen... Take 1/2 tablet daily    Losartan Potassium 25 Mg Tabs (Losartan potassium) .Marland Kitchen... 1/2 tablet daily    Aspirin 81 Mg Tbec (Aspirin) ..... 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 One by mouth daily  Problem # 4:  PACEMAKER, PERMANENT--ST. 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 Take 1/2 tablet daily    Losartan Potassium 25 Mg Tabs (Losartan potassium) .Marland Kitchen... 1/2 tablet daily    Aspirin 81 Mg Tbec (Aspirin) ..... One by mouth daily  Patient Instructions: 1)  Your physician recommends that you continue on your current medications as directed. Please refer to the Current Medication list given to you today. 2)  Your physician wants you to follow-up in:  6 MONTHS WITH KRISTIN AND PAULA You will receive a reminder letter in the mail two months in advance. If you don't receive a letter, please call our office to schedule the follow-up appointment.

## 2010-12-24 NOTE — Cardiovascular Report (Signed)
Summary: Office Visit   Office Visit   Imported By: Roderic Ovens 12/16/2010 15:05:49  _____________________________________________________________________  External Attachment:    Type:   Image     Comment:   External Document

## 2011-01-03 ENCOUNTER — Other Ambulatory Visit: Payer: Self-pay | Admitting: Internal Medicine

## 2011-01-10 ENCOUNTER — Other Ambulatory Visit: Payer: Self-pay | Admitting: Internal Medicine

## 2011-02-18 NOTE — Assessment & Plan Note (Signed)
[name]                         ELECTROPHYSIOLOGY OFFICE NOTE   AURILLA, COULIBALY                     MRN:          [id number]  DATE:12/12/2008                            DOB:          10/26/1945    [name] is seen following pacemaker implantation for complete heart  block and sinus node arrest.  She has a previously identified  nonischemic cardiomyopathy with an ejection fraction measured in July of  about 40%.  She is doing quite well.  She comes in now however with  complaint of a couple of days of feeling really lousy with some  shortness of breath.  She has had no fever.  She has had no chills.  She  has had no chest pain.  It is very hard for her to put a tag on what is  bothering her, but she has been sickly as I mentioned for about 3 days  and if anything a little bit worse today than couple of days ago.  There  has been some feeling of warmth.   MEDICATIONS:  1. Hydrochlorothiazide.  2. Crestor 20.  3. Coreg 25 b.i.d.  4. Carafate is notable for the absence of an ACE inhibitor.   PHYSICAL EXAMINATION:  VITAL SIGNS:  Her blood pressure was 123/74, the  pulse was 59, the weight was 192.  LUNGS:  Clear.  HEART:  Sounds were regular.  NECK:  There is no lymphadenopathy in the submandibular or  supraclavicular regions.  ABDOMEN:  Soft.  EXTREMITIES:  Without edema.   Interrogation of her device today demonstrated a St. Jude identity with  no intrinsic atrial rhythm and no intrinsic ventricular rhythm.  The  atrial impedance was 457 with a threshold of 0.7 at 0.5.  The RV  impedance was 342 with threshold of 1 volt at 0.5.  Heart rate excursion  was adequate.   IMPRESSION:  1. Complete heart block status post atrioventricular node ablation.  2. Sinus node arrest.  3. Status post dual chamber pacer for the above.  4. Cardiomyopathy with an ejection fraction most recently of about      40%.  5. Medications notable for the  absence of an ACE inhibitor.  6. Acute onset of a general malaise and some shortness of breath      unassociated with fever and chills.   I am not quite sure what is wrong with [name] today.  I have asked  that she plan to follow up with Dr. Katrinka Blazing in the next couple of days.  We will plan a BMP and a CBC today to see if we  can identify anything specifically.  I hope that with the acute onset  that will also be a self-limited process.     Duke Salvia, MD, [facility]  Electronically Signed    SCK/MedQ  DD: 12/12/2008  DT: 12/13/2008  Job #: [id number]   cc:   Dario Guardian, M.D.

## 2011-02-18 NOTE — Letter (Signed)
November 12, 2007    Dario Guardian, M.D.  [address]  [place], [place] [address]   RE:  [name], [name]  MRN:  [id number]  /  DOB:  Oct 27, 1945   Dear Drue Flirt,   I hope this letter finds you well. [name] is doing quite well.  Her  major complaint is shortness of breath and wheezing again.   She also has mild exercise intolerance.  Her medications include HCTZ  25, Coreg 40,  eyedrops.   PHYSICAL EXAMINATION:  Her blood pressure was 124/71 with a pulse of 61.  Her weight was stable at 182, although she has started to exercise.  LUNGS:  Clear.  HEART:  Sounds were regular.  EXTREMITIES:  Without edema.   Interrogation of her St. Jude identity pulse generator demonstrates no  intrinsic atrial or ventricular rhythm. Her atrial impedance was 478  with threshold of 0.5 at 0.5. Ventricular impedance was 372 with  threshold of 1 volt at 0.5. Max track rate was programmed at 120 and I  increased this today to 160.   IMPRESSION:  1. Previously identified nonischemic cardiomyopathy.  2. Complete heart block and sinus node arrest.  3. Status post pacer for the above.  4. Wheezing.   [name], [name] wheezing and shortness of breath bother me.  I had  misread my notes on her and thought that her most recent ejection  fraction was recorded as normal.  Unfortunately, the last thing I can  find at this point was in fact 3 or 4 years ago when it was 35%.  If  that were the case, resuming her ACE inhibitor and thinking about heart  failure would be appropriate.   She is to see you in the next couple of weeks and I have asked her to  talk with you about this.  If you would not mind picking up my mistake  and checking a BNP and ordering an echo which you can do through our  office at [phone],  I would appreciate that and then we can decide what  needs to be done to ameliorate her shortness of breath issues.    Sincerely,      Duke Salvia, MD, [facility]  Electronically  Signed    SCK/MedQ  DD: 11/12/2007  DT: 11/13/2007  Job #: [id number]

## 2011-02-18 NOTE — Assessment & Plan Note (Signed)
[facility]                            CARDIOLOGY OFFICE NOTE   ROSALAND, SHIFFMAN                     MRN:          [id number]  DATE:05/24/2008                            DOB:          04-Nov-1945    PRIMARY CARDIOLOGIST:  Duke Salvia, MD, [facility].   PRIMARY CARE PHYSICIAN:  Dario Guardian, MD   [name] is a very pleasant 65 year old African American female  patient who comes in today because of an echo done by Dr. Katrinka Blazing that  showed an ejection fraction of 40%.  In looking through our records, Ms.  Mcreynolds does have a history of nonischemic cardiomyopathy.  Her last echo  here in 2006 showed an ejection fraction in the 35% range.  She has had  a cardiac cath in the past that showed normal coronary arteries in 1999.  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  She  missed her appointment for her pacer clinic check earlier this month.   Overall, the patient is doing well.  She says she is not having any of  the wheezing that she had been having in the past.  She does get out of  breath if she goes up a flight of stairs, but she is able to walk a mile  without any problems.  She denies any chest pain, palpitations,  orthopnea, dizziness, or presyncope.  She is diligent about watching her  sodium and has not had any trouble with fluid.   CURRENT MEDICATIONS:  1. Hydrochlorothiazide 25 mg daily.  2. Coreg CR 40 mg daily.  3. Lotemax eye drops q.i.d.  4. Crestor 20 mg nightly.  5. Caltrate 600 mg b.i.d.  6. Vitamin D twice a week.   PHYSICAL EXAMINATION:  GENERAL:  This is a pleasant 65 year old African  American female in no acute distress.  VITAL SIGNS:  Blood pressure 98/70, pulse 60, weight 188.  NECK:  Without JVD, HJR, bruit, or thyroid enlargement.  LUNGS:  Clear in anterior, posterior, and lateral.  HEART:  Regular rate and rhythm at 60 beats  per minute.  Normal S1 and  S2 with a 1/6 systolic ejection murmur at the left sternal border.  ABDOMEN:  Soft without organomegaly, masses, lesions, or abnormal  tenderness.  EXTREMITIES:  Without cyanosis, clubbing, or edema.  She has good distal  pulses.   EKG:  Paced rhythm.   IMPRESSION:  1. Nonischemic cardiomyopathy, ejection fraction 40% on recent 2-D      echo dated April 11, 2008, with mild aortic valve sclerosis, no      stenosis, mild tricuspid regurgitation, and trivial pulmonic      insufficiency.  2. 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  3. Hypertension.  4. Dyslipidemia.   PLAN:  The patient's ejection fraction has not changed much since her  last echo.  Dr. Graciela Husbands had stated that he wanted to add an ACE inhibitor,  but her blood pressure is  quite low today and I am reluctant to do that  at this time.  We will have her pacer checked since she missed her last  appointment and she will schedule a followup with Dr. Graciela Husbands.      Jacolyn Reedy, PA-C  Electronically Signed      Everardo Beals. Juanda Chance, MD, [facility]  Electronically Signed   ML/MedQ  DD: 05/24/2008  DT: 05/25/2008  Job #: [id number]   cc:   Dario Guardian, M.D.

## 2011-02-21 ENCOUNTER — Other Ambulatory Visit: Payer: Self-pay | Admitting: *Deleted

## 2011-02-21 MED ORDER — CARVEDILOL 25 MG PO TABS: 25.0000 mg | ORAL_TABLET | Freq: Two times a day (BID) | ORAL | Status: DC

## 2011-02-21 NOTE — H&P (Signed)
[name], [name] NO.:  [id number]   MEDICAL RECORD NO.:  [id number]          PATIENT TYPE:  OIB   LOCATION:  2899                         FACILITY:  [facility]   PHYSICIAN:  Duke Salvia, M.D.  DATE OF BIRTH:  03/29/1946   DATE OF ADMISSION:  01/15/2005  DATE OF DISCHARGE:                                HISTORY & PHYSICAL   ELECTROPHYSIOLOGIST:  Dr. Sherryl Manges   CARDIOLOGIST:  Dr. Loraine Leriche Pulsipher   PRIMARY CARE PHYSICIAN:  Dr. Dario Guardian.   ALLERGIES:  PENICILLIN AND CODEINE.   HISTORY OF PRESENT ILLNESS:  [name] is a 65 year old female with  nonischemic cardiomyopathy with an ejection fraction of bout 35-40% thought  possibly secondary to tachycardia from atrial flutter. Her atrial flutter is  now rate controlled on this examination. She is on Coumadin and has been  therapeutic for the last 4 weeks by office studies. She first presented with  symptoms of left-sided chest pain, dyspnea, short of breath walking up  stairs, however she can ambulate on a flat surface without hindrance. She  has class II congestive heart failure symptoms. She presented with these  symptoms in February of this year to [facility] office and was  found to be in atrial flutter. She was scheduled for ablation, this was  canceled one time but now has been rescheduled for today, January 15, 2005.  The patient has a Pacesetter permanent placement which was implanted for  bradycardia associated with possible cardiac sarcoid back in 1998, and this  pacemaker is an elective replacement indicator. Her generator will also be  changed today. The patient did have work up in February which consisted of  an echocardiogram on November 19, 2004. The study showed an ejection  fraction of 35% with trace mitral regurgitation. She also had a Cardiolite  stress-test November 20, 2004 that showed ejection fraction 38%, global  hypokinesis, and mild anteroapical hypoperfusion. Because  of this she was  recommended for left heart catheterization. This was done December 03, 2004.  The study showed an ejection fraction of 40%, left main free of disease,  left anterior descending with minor luminal irregularities. Left circumflex  and right coronary artery are both normal although the right coronary artery  had minor irregularities proximally. The study also showed moderate global  hypokinesis. The patient once again has a Radiation protection practitioner pacemaker  implanted in 1998. Thromboembolic risk factors include hypertension,  dyslipidemia. She has class II congestive heart failure symptoms.   MEDICATIONS:  1.  Coreg 3.125 mg b.i.d.  2.  Zocor 20 mg daily at h.s.  3.  Hydrochlorothiazide 25 mg daily.  4.  Coumadin 5 mg tablets daily except for Tuesday and Saturday when she      takes 7.5 mg.  5.  Timolol 0.5% ophthalmic solution one drop to right eye daily.  6.  Cosopt one drop to the right eye b.i.d.  7.  Prednisone acetate 1% ophthalmic solution one drop to the left eye      q.i.d.   SOCIAL HISTORY:  The patient lives in [place] with her  husband. She is  retired. She used to work as a Materials engineer. She does not smoke, take alcoholic  beverages or recreational drugs.   FAMILY HISTORY:  Both mother and father died relatively early and had heart  problems. The patient has nine siblings, there is diabetes and coronary  artery disease among them.   REVIEW OF SYSTEMS:  CONSTITUTIONAL:  The patient did have a fever recently.  She had a urinalysis which was negative. Her fever has resolved after  antibiotic therapy. She is not having night sweats. She has no weight loss  but she is complaining of weight gain in the last 6 months possibly, and  secondary to medications.  HEENT:  No nasal discharge, no epistaxis, no hoarseness or vertigo. No  photophobia, no hearing loss. INTEGUMENT:  No ulcerations, non healing  lesions or rashes. CARDIOPULMONARY:  The patient has intermittent  chest  pain, undetermined origin. She has dyspnea on exertion especially when  walking up stairs. She sleeps on three pillows at night. She has paroxysmal  nocturnal dyspnea. She had one episode of syncope years ago which led to a  diagnosis of sarcoid. She is not having syncope since that time and no  presyncope. No claudication symptoms. She does have palpitations with her  atrial flutter.  UROGENITAL:  The patient has urge incontinence but no dysuria or hematuria.  NEUROLOGIC:  The patient is experiencing lower extremity weakness for the  last 2 weeks. No depression or anxiety.  GASTROINTESTINAL:  The patient is not complaining of nausea, vomiting or  diarrhea. She has never had bright red blood per rectum, melena, abdominal  pain. She is not experiencing chronic reflux symptoms. The patient has never  had peptic ulcer disease.  ENDOCRINE:  The patient relates on history of diabetes although there is  diabetes in her family. She has no history of thyroid disease.  MUSCULOSKELETAL:  No joint pain, no effusions.   All other systems are negative.   PHYSICAL EXAMINATION:  VITAL SIGNS:  Temperature 97.5, pulse is 59 and  regular, respirations 20, blood pressure 136/65, oxygen saturation 99% on  room air. Weight is 181.  GENERAL:  She is alert and oriented x3.  HEENT:  Eyes - pupils are equal, round and reactive to light. Extraocular  movements are intact. Sclerae are clear. Nares without discharge.  NECK:  Supple, no carotid bruits auscultated. No thyromegaly, no jugular  venous distension. No cervical lymphadenopathy.  HEART:  Regular rate and rhythm, S1, S2 are auscultated. No S3, no S4, no  murmur.  No rub, gallop. The PMI is nondisplaced.  LUNGS:  Clear to auscultation and percussion bilaterally without wheezes or  rubs.  ABDOMEN:  Soft, nondistended, bowel sounds are present, no rebound or guarding. No hepatosplenomegaly. The abdominal aorta is non pulsatile, non  expansile.   EXTREMITIES:  Show no evidence of clubbing, cyanosis or edema. There are no  ulcerations or rashes.  MUSCULOSKELETAL:  No joint deformity, effusions, kyphosis or scoliosis.  NEUROLOGIC:  Alert and oriented x3. Cranial nerves II-XII are grossly  intact.  EXTREMITIES:  She has easily palpable dorsalis pedis pulses bilaterally  without edema to the pretibial areas. Radial pulses are 4/4 bilaterally.   Electrocardiogram on January 15, 2005 show typical atrial flutter with a rate  of 60, there is a wide QRS 210 to 240 milliseconds, and left ventricular  hypertrophy. Labs are pending at this time.   IMPRESSION:  1.  Admitted with atrial fibrillation for ablation.  She does  not quality      for __________. She did not have a transesophageal echocardiogram within      6 months.  2.  Cardiomyopathy, non ischemic. Ejection fraction 38 to 40%.  3.  Class II congestive heart failure symptoms.  4.  Sarcoid diagnosed 13 years ago with possible cardiac involvement.  5.  Status post permanent pacemaker in 1998 secondary to bradycardia. 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 Her bradycardia was associated with cardiac      sarcoid.  6.  Hypertension.  7.  Dyslipidemia.  8.  Electrocardiograms show marked left ventricular hypertrophy with atrial      flutter, controlled ventricular rate.  9.  Coumadin therapy last 4 weeks, therapeutic by office notes.  10. No coronary artery disease by catheterization December 03, 2004.  11. Glaucoma.  12. Dyslipidemia.   PLAN:  Atrial flutter ablation.  Pacemaker generator change by Dr. Graciela Husbands.      GM/MEDQ  D:  01/15/2005  T:  01/15/2005  Job:  [id number]

## 2011-02-21 NOTE — Letter (Signed)
November 27, 2006    Dario Guardian, M.D.  [address]  [place], [place] [address]   RE:  [name], [name]  MRN:  [id number]  /  DOB:  May 11, 1946   Dear Drue Flirt:   I hope this letter finds you and your family well.  [name] comes in  today.  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  She says the work load is not too bad.   Her major complaint is some shortness of breath and what she calls  wheezing that occurs at night, particularly with recumbence.  She denies  heartburn symptoms however.   Her medications are notable for Coreg 40 mg a day, eye drops, and  hydrochlorothiazide.   On examination, her blood pressure is 110/71, her pulse is 75.  Lungs  were clear, heart sounds were regular, the extremities were without  edema.   Interrogation of her St. Jude Identity pulse generator demonstrates that  she has no intrinsic atrial or ventricular rhythm.  Her atrial impedence  was 446.  Ventricular impedence was 349.  Atrial threshold was 0.75 at  0.5.  Ventricular threshold was 1 volt at 0.5.  There are no  intercurrent heart rate episodes.  Heart rate excursion is limited by  her __________ which is programmed currently to 105 and has been  reprogrammed to 120.   IMPRESSION:  1. 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  2. Status post pacer for No.1.  3. Iatrogenic chronic incompetence with new programming.  4. Sarcoid heart disease.  5. Nocturnal wheezing, ?Related to GI reflux.   [name], [name] is doing okay.  I have adjusted her Coreg from 40 to  80, given her LV dysfunction.  I have given her samples of Prevacid in  the hopes that her wheezing is in fact related to reflux and I have  asked her to follow up with you in 3 to 4 weeks.   I have reprogrammed her device as noted to see if we can improve her  exercise capacity.   We will see her again in 6 months'  time.  If her exercise is not  worsened by our current reprogramming, I will have the device clinic  reprogram her upper sense rate to 150 beats per minute.  At that time  also, I would like to get a 2D echo to reassess her left ventricular  function.   I hope this letter finds you well, and thanks for allowing [place] to  participate in her care.    Sincerely,      Duke Salvia, MD, [facility]  Electronically Signed    SCK/MedQ  DD: 11/27/2006  DT: 11/27/2006  Job #: [id number]

## 2011-02-21 NOTE — Assessment & Plan Note (Signed)
[facility]                           ELECTROPHYSIOLOGY OFFICE NOTE   AKILI, CUDA                     MRN:          [id number]  DATE:06/22/2006                            DOB:          [birth year]    Ms. Auzenne was seen in the clinic on June 22, 2006 for followup of her  [place]. Jude model [phone], Identify.  Date of implant was January 07, 2005 for  nonischemic cardiomyopathy and status post A flutter ablation done in April,  2006.   On interrogation of her device today, her battery voltage was 2.78.  P waves  and R waves were not measured.  She has pacemaker dependence to a rate of 30  with an atrial capture threshold of 0.75 volts at 0.5 ms and an atrial lead  impedence of 452.  Ventricular capture threshold was 1 volt at 0.5 ms and a  ventricular lead impedence of 340.   No changes were made in her parameters today.  She will be again in six  months time.                                   Altha Harm, LPN                                Duke Salvia, MD, [facility]   PO/MedQ  DD:  06/22/2006  DT:  06/23/2006  Job #:  [id number]

## 2011-02-21 NOTE — Op Note (Signed)
NAMEMONACA, WADAS NO.:  [id number]   MEDICAL RECORD NO.:  [id number]          PATIENT TYPE:  OIB   LOCATION:  2899                         FACILITY:  [facility]   PHYSICIAN:  Duke Salvia, M.D.  DATE OF BIRTH:  Nov 02, 1945   DATE OF PROCEDURE:  02/17/2005  DATE OF DISCHARGE:                                 OPERATIVE REPORT   PREOPERATIVE DIAGNOSIS:  Atrial flutter with complete heart block,  previously implanted pacemaker.   POSTOPERATIVE DIAGNOSIS:  Atrial flutter with complete heart block,  previously implanted pacemaker; sinus rhythm.   PROCEDURE:  Pacer interrogation, reprogramming and invasive  electrophysiological study, arrhythmia mapping, radiofrequency catheter  ablation.   Following the obtaining of informed consent, the patient was brought to the  electrophysiology laboratory and placed on the fluoroscopic table in supine  position.  After routine prep and drape, cardiac catheterization was  performed with local anesthesia and conscious sedation.  Noninvasive blood  pressure monitoring and transcutaneous oxygen saturation monitoring and  tidal CO2 monitoring were performed continuously throughout the procedure.  Following the procedure, the catheters were removed, hemostasis was obtained  and the patient was transferred to the floor in stable condition.   CATHETERS:  A 5 French quadripolar catheter was inserted via left femoral  vein to the AV junction.  A 7 French octapolar catheter was inserted via the right femoral vein to the  coronary sinus.  A 7 [place] duodecipolar catheter was inserted via the left femoral vein to  the tricuspid annulus.  A 7 French 8 mm deflectable tip ablation catheter was inserted via an SL-II  sheath from the right femoral vein to mapping sites in the posterior septal  space.   Surface leads I, aVF and V1 were monitored continuously throughout the  procedure. Following insertion of the catheters, a stimulation  protocol  included:  Incremental atrial pacing  Incremental ventricular pacing.  Extra coronary sinus pacing.   RESULTS:  1.  Baseline measurements and intracardiac intervals:  Rhythm:  Initial was atrial flutter  AA cycle length 193 msec.  VV interval 1009 msec.  QR restoration 193 msec.  QT interval 500 msec.  PR interval N/A.  P-wave duration N/A.   AH interval N/M  HV:  complete heart block.   Final rhythm sinus.  AA interval:  No intrinsic atrial rhythm.  VV interval:  1000 msec.  QR restoration 193 msec.  QT interval 193 msec.  PR interval N/A.  P-wave duration 168 msec.   Bundle branch block is absent and absent.  Pre-excitation is absent and  absent.   AV nodal function:  There is bidirectional AV block.  There was essentially  no intrinsic atrial rhythm.   Accessory pathway function:  No evidence of accessory pathway was  identified.   Arrhythmias induced:  The patient presented to the lab in atrial flutter.  However, during catheter manipulation, the patient reverted to sinus rhythm.  Because of this, coronary sinus pacing was used to identify cavotricuspid  isthmus conduction and was used as an end point for RF ablation.   Radiofrequency energy:  A total  of 2 minutes and 9 seconds of RF energy were  applied between the tricuspid annulus and the inferior vena cava with the  elimination of cavotricuspid isthmus conduction as well as the obliteration  of all atrial electrograms.   Fluoroscopy time:  A total of five minutes and 12 seconds of radio wave, 5  minutes 12 seconds of fluoroscopy was utilized at seven and a half frames  per second.   IMPRESSION:  1.  Sinus node dysfunction with atrial pacing.  2.  Abnormal atrial function manifested by sustained atrial flutter with      successful cavotricuspid isthmus conduction ablation.  3.  No antegrade or retrograde AV nodal conduction.  4.  No conduction in the His Purkinje system.  5.  No evidence of  accessory pathway.  6.  Normal ventricular response to program stimulation.   SUMMARY AND CONCLUSION:  Results of electrophysiological testing  successfully demonstrated interruption of cavotricuspid isthmus conduction  using radio wave energy eliminating the substrate for the patient's atrial  flutter.  The patient's pacemaker was reprogrammed to the DDIR mode.  We  will plan to watch the patient overnight, maintain aspirin for six weeks,  endocarditis prophylaxis for six weeks.      SCK/MEDQ  D:  02/17/2005  T:  02/17/2005  Job:  [id number]   cc:   EP Lab   Dario Guardian, M.D.  [address]  [place]  [place] [address]  Fax: [phone]

## 2011-02-21 NOTE — Cardiovascular Report (Signed)
[name], [name] NO.:  [id number]   MEDICAL RECORD NO.:  [id number]          PATIENT TYPE:  OIB   LOCATION:  6501                         FACILITY:  [facility]   PHYSICIAN:  Carole Binning, M.D. LHCDATE OF BIRTH:  1945/12/09   DATE OF PROCEDURE:  12/03/2004  DATE OF DISCHARGE:                              CARDIAC CATHETERIZATION   PROCEDURE PERFORMED:  Left heart catheterization with coronary angiography,  left ventriculography.   INDICATIONS:  [name] is a 65 year old woman with history of sarcoidosis  and previous permanent pacemaker placement. She presented to the office with  symptoms of progressive shortness of breath. She was found to be in atrial  flutter. An adenosine Myoview scan revealed decrease in her ejection  fraction from previously to approximately 38%. There was a partially  reversible perfusion defect in the anterior apical wall. She was therefore  referred for cardiac catheterization to rule out coronary artery disease.   PROCEDURE NOTE:  A 4-French sheath was placed in the right femoral artery.  Coronary angiography was performed with the 4-French JL-4 and JR-4  catheters. Left ventriculography was performed with an angled pigtail  catheter. Contrast was Omnipaque. There were no complications.   RESULTS:  HEMODYNAMICS:  Left ventricular pressure 124/14.  Aortic pressure 124/80. There is no aortic valve gradient.   LEFT VENTRICULOGRAM:  There is moderate global hypokinesis, particularly of  the anterolateral wall.  The ejection fraction is estimated at 40%. There is  no mitral regurgitation.   CORONARY ARTERIOGRAPHY:  1.  LEFT MAIN CORONARY ARTERY:  The left main coronary artery is normal.  2.  LEFT ANTERIOR DESCENDING CORONARY ARTERY:  The left anterior descending      artery gives rise to a single normal-sized diagonal branch.  The LAD has      minor luminal  irregularities.  3.  LEFT CIRCUMFLEX CORONARY ARTERY:  The left  circumflex gives rise to a      small ramus intermedius, a small first obtuse marginal and a large      branching second obtuse marginal. The left circumflex is normal.  4.  RIGHT CORONARY ARTERY:  The right coronary has minor luminal      irregularities in the proximal vessel. This vessel is otherwise normal,      giving rise to a large posterior descending artery and three small      posterolateral branches.   IMPRESSION:  1.  Moderately-decreased left ventricular systolic function with global      hypokinesis of the left ventricle.  2.  No significant coronary artery disease identified.   RECOMMENDATIONS:  For medical therapy and further treatment of the patient's  atrial arrhythmia per Dr. Duke Salvia.      MWP/MEDQ  D:  12/03/2004  T:  12/03/2004  Job:  [id number]   cc:   Duke Salvia, M.D.   Dario Guardian, M.D.  [address]  [place]  [place] [address]  Fax: [phone]

## 2011-02-21 NOTE — Op Note (Signed)
[name], [name] NO.:  [id number]   MEDICAL RECORD NO.:  [id number]          PATIENT TYPE:  OIB   LOCATION:  2852                         FACILITY:  [facility]   PHYSICIAN:  Duke Salvia, M.D.  DATE OF BIRTH:  23-Apr-1946   DATE OF PROCEDURE:  01/17/2005  DATE OF DISCHARGE:                                 OPERATIVE REPORT   PROCEDURE:  Following obtaining informed consent, the patient was brought to  the electrophysiology laboratory and placed on the fluoroscopic table in the  supine position.  After routine prep and drape of the left upper chest,  lidocaine was infiltrated along the line of the previous incision, carried  down to the layer of the prepectoral fascia using sharp dissection.  The  pocket was carefully opened.  It was somewhat more caudal than had been  appreciated, and the device was freed up.  The atrial lead came out without  difficulty.  The ventricular lead could not be removed easily.  Because of  concerns at this point about fracturing the lead in a pacer-dependent  patient, it was elected to put in a temporary transvenous pacemaker.  In  anticipation of this, venography was undertaken and then a micro puncture  system was used to cannulate the left subclavian vein which allowed for an  up titration and placing of a 7 [place] __________ sheath.  Through this was  passed a 5 [place] temporary transvenous pacemaker in the right ventricular  apex.  Threshold was less than 1 mA.   At this point, the set screw to the ventricular lead was removed and the  lead removal tool from [facility]. Jude was utilized via the set screw hole to try  and dislodge the lead from the can.  It was subsequently able to be  accomplished.  Interrogation of the previously-implanted 1388T lead  demonstrated that there was no intrinsic R waves.  The impedance was 380  ohms and threshold at 1 v at 0.5 msec and current threshold was 2.5 mA.  At  this point, this lead was attached  to the new St. Jude Identity 80XL pulse  generator, model [id number], serial # [id number].  Ventricular pacing was identified.  Impedances were again in the 380 range.   The atrial lead previously having been interrogated with atrial flutter  waves of 3-10 mV, impedance of 380 ohms, was attached into the pulse  generator and threshold testing was undertaken.  These were done, however,  after sinus rhythm was accomplished (see below).  Once the ventricular lead  was placed into the new pulse generator, the temporary transvenous lead was  then removed.  It had been in place probably less than 10 minutes.  The  sheath was removed and pressure was held.  Over the next 5-10 minutes, the  patient was sedated using a combination of Versed and fentanyl, and DC  cardioversion was undertaken with 100 joule shock, restoring sinus rhythm,  as was the intention.  At this point, there was no intrinsic atrial rhythm,  but the atrial threshold was 1 v at 0.5 msec.  The pacemaker  was programmed  in the DDDR mode.   The pocket was copiously irrigated with antibiotic-containing saline  solution.  Hemostasis was assured.  The leads and pulse generator placed in  the pocket.  The wound was then closed in three layers in the normal  fashion.  The  wound was washed and dried and Benzoin and Steri-Strips dressing was  applied.  Needle counts, sponge counts and instrument counts were correct at  the end of the procedure, according to the staff.  The patient tolerated the  procedure without apparent complication.      SCK/MEDQ  D:  01/17/2005  T:  01/17/2005  Job:  [id number]   cc:   Stacie Acres. White, M.D.  [address]  [place]  [place] [address]  Fax: [phone]   Electrophysiology Laboratory

## 2011-02-21 NOTE — H&P (Signed)
[name], Kline NO.:  [id number]   MEDICAL RECORD NO.:  [id number]          PATIENT TYPE:  OIB   LOCATION:  2899                         FACILITY:  [facility]   PHYSICIAN:  Duke Salvia, M.D.  DATE OF BIRTH:  1945-12-02   DATE OF ADMISSION:  02/17/2005  DATE OF DISCHARGE:                                HISTORY & PHYSICAL   Primary cardiologist, Carole Binning, M.D., electrophysiologist, Duke Salvia, M.D., and primary caregiver Dario Guardian, M.D.   ALLERGIES:  CODEINE and PENICILLIN.   PRESENTING CIRCUMSTANCE:  I'm here for a procedure by Dr. Graciela Husbands.   HISTORY OF PRESENT ILLNESS:  [name] is a 65 year old female with  a known ischemic cardiomyopathy, ejection fraction at 35-40%, possibly  tachycardia-mediated secondary to her atrial flutter.  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  She  presented in April 2006 for a generator change-out.  At the time of  generator change, ablation of her atrial flutter was scheduled but cancelled  secondary to elevated protime; however, she was cardioverted at that time.   She has now reverted to atrial flutter.  She was seen in the office with Dr.  Graciela Husbands on Feb 10, 2005.  For the preceding month, her Coumadin studies all  show that her INR has been therapeutic in the range of 2-3, and she presents  now on May 15 for atrial flutter ablation.  At this time her PT is 20.1, INR  2.2.  She does mention that her breathing experience has improved since  April 2006.  She has class II congestive heart failure symptoms.  Cardiac  risk factors include hypertension and dyslipidemia.  She had a  catheterization, left heart catheterization in February 2006, which showed  global hypokinesis, ejection fraction of 40%.  The LAD had minor  irregularities.  The left main, left circumflex and right coronary artery  were free of significant disease.  Echocardiogram February  2006:  Ejection  fraction 30%, significant left ventricular dysfunction.  Cardiolite study  February 2006, ejection fraction 38%, no ischemia.   MEDICATIONS:  1.  Coreg 3.125 mg twice daily.  2.  Coumadin as directed by the Coumadin clinic.  3.  Hydrochlorothiazide 25 mg daily.  4.  Vytorin 10/20 mg one tablet daily at bedtime for her glaucoma.  5.  Timolol 0.5% ophthalmic solution one drop in the right eye daily.  6.  Cosopt one drop in the right eye twice daily.  7.  Prednisone acetate 1% ophthalmic solution one drop in the left eye four      times daily.   SOCIAL HISTORY:  The patient lives in [place] with her husband.  She is  retired from [employer].  She does not partake of alcoholic  beverages, tobacco or recreational drugs.   FAMILY HISTORY:  Both mother and father died relatively early with heart  problems.  She has nine siblings.  There is heart disease and diabetes among  them.   REVIEW OF SYSTEMS:  The patient is not having fevers, chills, night  sweats,  weight change or adenopathy.  HEENT:  No nasal discharge, no epistaxis,  hoarseness or vertigo.  No diplopia or photophobia.  INTEGUMENT:  No rashes,  nonhealing ulcerations.  CARDIOPULMONARY:  Intermittent chest pain,  undetermined origin.  Shortness of breath especially with exertion.  She  does have orthopnea, sleeps on three pillows, and paroxysmal nocturnal  dyspnea.  She had one history of syncope years ago, which led to a diagnosis  of sarcoid.  She does feel palpitations and that is constantly.  UROGENITAL:  No dysuria, frequency or urgency.  NEUROPSYCHIATRIC:  Lower extremity  weakness.  No numbness, depression or anxiety.  GASTROINTESTINAL:  No  history of reflux disease.  No history of peptic ulcer disease.  No melena,  no bright red blood per rectum.  ENDOCRINE:  No history of diabetes,  although it is in her family.  She has no history of thyroid disease.  MUSCULOSKELETAL:  No arthralgias,  joint swellings, deformity or pain.  All  other systems all negative.   PHYSICAL EXAMINATION:  VITAL SIGNS:  The patient was afebrile and her vital  signs were stable.  GENERAL:  She is alert and oriented x3.  She is in no acute distress, quite  comfortable.  HEENT:  Normocephalic, atraumatic.  Eyes:  Pupils equal, round, and reactive  to light, extraocular movements are intact, sclerae are anicteric.  Nares  without discharge.  NECK:  Supple, no carotid bruits auscultated.  No thyromegaly.  No jugular  venous distention.  No cervical lymphadenopathy.  CARDIAC:  An irregular rate and rhythm without murmur.  PMI nondisplaced.  CHEST:  Lungs are clear to auscultation and percussion bilaterally without  wheezes or rub.  ABDOMEN:  Soft, nondistended, bowel sounds are present.  No rebound or  guarding.  No hepatosplenomegaly.  The abdominal aorta is nonpulsatile.  EXTREMITIES:  No evidence of clubbing, cyanosis, or edema.  SKIN:  There are no rashes, no ulcerations or petechiae.  MUSCULOSKELETAL:  No joint deformity or effusion.  She has no kyphosis or  scoliosis.  NEUROLOGIC:  Alert and oriented x3.  Cranial nerves II-XII are grossly  intact.  Grip strength 5/5 in both extremities.   Chest x-ray not examined.  Electrocardiogram shows atrial flutter.  Laboratory studies:  On May 15, PT is 20.1, INR 2.2.  Complete blood cell  count:  White cells 3.7, hemoglobin 11.1, hematocrit 32.4, platelets 265.  The BMET which has been ordered is not available at the time of this  dictation.   PROBLEM LIST:  1.  Admitted with recurrent atrial flutter for atrial flutter ablation on      Feb 17, 2005.  2.  History of sarcoidosis.  3.  Cardiomyopathy with ejection fraction of 35-40%, possibly tachycardia-      mediated.  4.  Status post Pacesetter permanent pacemaker for bradycardia secondary to      cardiac sarcoid.  5.  Hypertension. 6.  Dyslipidemia.  7.  History of syncope, which led to a  diagnosis of sarcoid.  8.  No coronary artery disease by catheterization February 2006.  9.  Glaucoma.   PLAN:  Atrial flutter ablation Feb 17, 2005, Dr. Sherryl Manges.      GM/MEDQ  D:  02/17/2005  T:  02/17/2005  Job:  [id number]

## 2011-02-21 NOTE — Discharge Summary (Signed)
[name], [name] NO.:  [id number]   MEDICAL RECORD NO.:  [id number]          PATIENT TYPE:  OIB   LOCATION:  2852                         FACILITY:  [facility]   PHYSICIAN:  Duke Salvia, M.D.  DATE OF BIRTH:  09-06-1946   DATE OF ADMISSION:  01/17/2005  DATE OF DISCHARGE:  01/17/2005                                 DISCHARGE SUMMARY   DISCHARGE DIAGNOSES:  1.  Explantation of pulse generator, a Trilogy DR, by Dr. Sherryl Manges.  2.  Implantation of St. Jude Identity ADx XL DR DDDR pacemaker generator.  3.  Direct current cardioversion of atrial flutter, January 17, 2005, Dr.      Graciela Husbands, with attainment of sinus rhythm.  The patient's PT on      presentation January 17, 2005 was 22.5.  The INR was 2.7.   DISCHARGE DISPOSITION:  The patient was discharged on the same day.  She is  having only mild discomfort where the pacemaker generator was changed.  There is no swelling at the incision site.  The patient has a bandage  covering it.  There is no active drainage.  The patient will remove the  bandage in the morning on Saturday January 18, 2005 and leave the incision  open to the air.  She is to avoid raising her arm to the shoulder for the  next two days.  She is to keep her incision dry until Wednesday January 22, 2005.  Sponge bathe until then.  She had been on Coumadin, the following  dose, coming into procedure the week of January 13, 2005.  Her Coumadin doses  were 5 mg every day except for 7.5 on Tuesday and Saturday.  On  presentation, her INR was elevated, I think it was 3.5, and the procedure  had to be postponed for a further two days until January 17, 2005.  She came  in on January 15, 2005.   1.  So, she will go home on the following Coumadin dose:  5 mg tablets      daily, until she sees the Coumadin staff the week of January 20, 2005.  2.  Coreg 3.125 mg b.i.d.  3.  Zocor 20 mg daily at bedtime.  4.  Hydrochlorothiazide 25 mg daily.  5.  Timolol 0.5%  ophthalmic solution 1 drop in the right eye daily.  6.  Cosopt 1 drop right eye b.i.d.  7.  Prednisone acetate 1% ophthalmic solution 1 drop in the left eye q.i.d.  8.  For pain, Darvocet-N 100, 1-2 tabs q.3-4 h. as needed.   She will have appointments in the office weekly for Coumadin check for the  next weeks, and she will see Dr. Graciela Husbands in three weeks.  Our office will call  with all those appointments.   SECONDARY DIAGNOSES:  1.  History of atrial flutter, presenting for atrial flutter ablation.  This      will be planned three weeks from now.  2.  Cardiomyopathy, a nonischemic cardiomyopathy, ejection fraction 38-40%,      possible sarcoid in derivative.  She had sarcoid diagnosed  13 years ago.  3.  Class II congestive heart failure symptoms.  4.  Status post permanent pacemaker for bradycardia in 1998.  5.  Hypertension.  6.  Dyslipidemia.  7.  Electrocardiogram shows marked left ventricular hypertrophy with atrial      flutter, controlled ventricular rate.  8.  Coumadin therapy has been therapeutic for the four weeks prior to her      presentation here January 15, 2005.  9.  No coronary artery disease by left heart catheterization December 03, 2004.  10. Glaucoma.  11. Dyslipidemia.   PROCEDURE:  January 17, 2005, explantation of Trilogy DR pulse generator for  pacemaker with implantation of pacemaker generator, a St. Jude Identity ADx  with DDDR capability, and DC cardioversion.   The patient will be discharged on the medications as stated above, and with  followup as called in by the office.      GM/MEDQ  D:  01/17/2005  T:  01/17/2005  Job:  [id number]   cc:   Duke Salvia, M.D.   Dario Guardian, M.D.  [address]  [place]  [place] [address]  Fax: [phone]

## 2011-02-21 NOTE — Discharge Summary (Signed)
[name], [name] NO.:  [id number]   MEDICAL RECORD NO.:  [id number]          PATIENT TYPE:  OIB   LOCATION:  3735                         FACILITY:  [facility]   PHYSICIAN:  Duke Salvia, M.D.  DATE OF BIRTH:  15-Jul-1946   DATE OF ADMISSION:  02/17/2005  DATE OF DISCHARGE:                                 DISCHARGE SUMMARY   CARDIOLOGIST:  Carole Binning, M.D.   PRIMARY CAREGIVER:  Dario Guardian, M.D.   DISCHARGE DIAGNOSES:  1.  Discharging postprocedure day #1 status post radiofrequency catheter      ablation of typical atrial flutter with production of caval tricuspid      isthmus block.  2.  History of recurrent atrial flutter, symptomatic with palpitations.  3.  Nonischemic cardiomyopathy, ejection fraction 35-40%, possibly secondary      to tachycardia.   SECONDARY DIAGNOSES:  1.  Status post Pacesetter implantation for bradycardia secondary to cardiac      sarcoid.  2.  Sarcoidosis.  3.  Hypertension.  4.  Dyslipidemia.  5.  History of syncope which led to diagnosis of sarcoid.  6.  Glaucoma.  7.  Status post left heart catheterization in February of 2006 with normal      coronary anatomy.   PROCEDURES:  Radiofrequency catheter ablation of atypical atrial flutter  with successful creation of caval tricuspid isthmus conduction block.  Duke Salvia, M.D., electrophysiologist.   DISCHARGE DISPOSITION:  [name] is discharging day #1 status post  radiofrequency catheter ablation.  She is AV pacing at a rate of 60.  She  has some vague back and some lower abdominal pain.  Otherwise the  catheterization site is without pain or swelling.  She is maintaining sinus  rhythm at the current time with AV pacing.  She is afebrile with a  temperature of 97.7 degrees, oxygen saturation 98% on room air and blood  pressure 110/50.  Her PT INR on the morning of discharge is pending.  She  will discharge on the following medications.   DISCHARGE MEDICATIONS:  1.  Coreg 325 mg twice daily.  2.  Hydrochlorothiazide 25 mg daily.  3.  Vytorin 10/20 mg daily at bedtime.  4.  Coumadin 5 mg daily.  This is her prehospitalization dose.  5.  Cosopt one drop in the right eye twice daily.  6.  Timolol ophthalmic solution one drop in the right eye daily.  7.  Prednisone acetate 1% ophthalmic solution one drop in the left eye four      times daily.  8.  Enteric-coated aspirin 81 mg for the next six weeks.   WOUND CARE:  The patient may shower.  She is to call [phone] if she  experiences swelling, increased pain or redness at the catheterization site.   ACTIVITY:  She is asked not to drive for the next two days and not to engage  in any heavy lifting for the next two weeks.   FOLLOWUP:  She will return to the Coumadin clinic on Monday, Feb 24, 2005,  at 10 a.m.  She appointment with  Dr. Graciela Husbands on Friday, Feb 28, 2005, at 2:15  p.m.  There is added note that if she has dental work in the next six  months, even a teeth cleaning, through November of 2006, she is to call [phone] for antibiotic coverage.   BRIEF HISTORY:  [name] is a 65 year old female.  She has  nonischemic cardiomyopathy with an ejection fraction of 35-40%, possibly  tachycardia mediated secondary to recurrent atrial flutter.  She has a  [employer] pacemaker implanted in 1998 secondary to possible cardiac  sarcoid.  She presented in April of 2006 for generator change out.  At the  time of the generator change, ablation was also scheduled, but this was  canceled due to elevated PT level.  She was cardioverted at office visit in  early May.  The patient has now reverted to atrial flutter.  She has been  therapeutic on her Coumadin for the last four weeks.  She presented  electively on Feb 17, 2005, for atrial flutter ablation.  She says her  breathing experience has improved since April of 2006.  She has class II  congestive heart failure  symptoms.   HOSPITAL COURSE:  [name] presented electively to [facility] on  Monday 15, 2006.  She underwent radiofrequency catheter ablation of her  atrial fibrillation and flutter the same day with successful creation of a  caval tricuspid isthmus block.  The patient has done well in the  postprocedure period.  She has had no cardiac dysrhythmias and no  respiratory distress.  She is complaining of mild back pain.  She is  discharged on the medications and followup with Dr. Graciela Husbands as dictated.  She  will be restarted on her Coumadin 5 mg daily today and present to the  Coumadin clinic one week.      GM/MEDQ  D:  02/18/2005  T:  02/18/2005  Job:  [id number]   cc:   Carole Binning, M.D. [facility]   Dario Guardian, M.D.  [address]  [place]  [place] [address]  Fax: [phone]

## 2011-04-14 ENCOUNTER — Encounter: Payer: Self-pay | Admitting: Internal Medicine

## 2011-05-07 DIAGNOSIS — R55 Syncope and collapse: Secondary | ICD-10-CM

## 2011-05-07 HISTORY — DX: Syncope and collapse: R55

## 2011-05-23 ENCOUNTER — Emergency Department (HOSPITAL_COMMUNITY): Payer: PRIVATE HEALTH INSURANCE

## 2011-05-23 ENCOUNTER — Inpatient Hospital Stay (HOSPITAL_COMMUNITY)
Admission: EM | Admit: 2011-05-23 | Discharge: 2011-05-25 | DRG: 312 | Disposition: A | Discharge status: Home / Self Care | Payer: PRIVATE HEALTH INSURANCE | Attending: Cardiology | Admitting: Cardiology

## 2011-05-23 DIAGNOSIS — R55 Syncope and collapse: Principal | ICD-10-CM | POA: Diagnosis present

## 2011-05-23 DIAGNOSIS — I498 Other specified cardiac arrhythmias: Secondary | ICD-10-CM | POA: Diagnosis present

## 2011-05-23 DIAGNOSIS — D869 Sarcoidosis, unspecified: Secondary | ICD-10-CM | POA: Diagnosis present

## 2011-05-23 DIAGNOSIS — E7800 Pure hypercholesterolemia, unspecified: Secondary | ICD-10-CM | POA: Diagnosis present

## 2011-05-23 DIAGNOSIS — H409 Unspecified glaucoma: Secondary | ICD-10-CM | POA: Diagnosis present

## 2011-05-23 DIAGNOSIS — K625 Hemorrhage of anus and rectum: Secondary | ICD-10-CM | POA: Diagnosis present

## 2011-05-23 DIAGNOSIS — R109 Unspecified abdominal pain: Secondary | ICD-10-CM | POA: Diagnosis not present

## 2011-05-23 DIAGNOSIS — Z7982 Long term (current) use of aspirin: Secondary | ICD-10-CM

## 2011-05-23 DIAGNOSIS — Z79899 Other long term (current) drug therapy: Secondary | ICD-10-CM

## 2011-05-23 DIAGNOSIS — Z823 Family history of stroke: Secondary | ICD-10-CM

## 2011-05-23 DIAGNOSIS — Z950 Presence of cardiac pacemaker: Secondary | ICD-10-CM

## 2011-05-23 DIAGNOSIS — I43 Cardiomyopathy in diseases classified elsewhere: Secondary | ICD-10-CM | POA: Diagnosis present

## 2011-05-23 DIAGNOSIS — Z87891 Personal history of nicotine dependence: Secondary | ICD-10-CM

## 2011-05-23 DIAGNOSIS — R0789 Other chest pain: Secondary | ICD-10-CM | POA: Diagnosis present

## 2011-05-23 DIAGNOSIS — Z880 Allergy status to penicillin: Secondary | ICD-10-CM

## 2011-05-23 LAB — CBC
Hemoglobin: 10.7 g/dL — ABNORMAL LOW (ref 12.0–15.0)
MCH: 29.9 pg (ref 26.0–34.0)
Platelets: 230 10*3/uL (ref 150–400)
RBC: 3.58 MIL/uL — ABNORMAL LOW (ref 3.87–5.11)
WBC: 4.4 10*3/uL (ref 4.0–10.5)

## 2011-05-23 LAB — DIFFERENTIAL
Lymphocytes Relative: 21 % (ref 12–46)
Lymphs Abs: 0.9 10*3/uL (ref 0.7–4.0)
Neutrophils Relative %: 66 % (ref 43–77)

## 2011-05-23 LAB — POCT I-STAT TROPONIN I: Troponin i, poc: 0 ng/mL (ref 0.00–0.08)

## 2011-05-23 LAB — BASIC METABOLIC PANEL
CO2: 26 mEq/L (ref 19–32)
Calcium: 10.6 mg/dL — ABNORMAL HIGH (ref 8.4–10.5)
Chloride: 107 mEq/L (ref 96–112)
Glucose, Bld: 99 mg/dL (ref 70–99)
Potassium: 4.4 mEq/L (ref 3.5–5.1)
Sodium: 140 mEq/L (ref 135–145)

## 2011-05-23 LAB — URINALYSIS, ROUTINE W REFLEX MICROSCOPIC
Glucose, UA: NEGATIVE mg/dL
Leukocytes, UA: NEGATIVE
Protein, ur: NEGATIVE mg/dL
Specific Gravity, Urine: 1.017 (ref 1.005–1.030)

## 2011-05-24 LAB — VITAMIN B12: Vitamin B-12: 652 pg/mL (ref 211–911)

## 2011-05-24 LAB — FERRITIN: Ferritin: 176 ng/mL (ref 10–291)

## 2011-05-24 LAB — FOLATE: Folate: 16 ng/mL

## 2011-05-24 LAB — TSH: TSH: 2.271 u[IU]/mL (ref 0.350–4.500)

## 2011-05-24 LAB — COMPREHENSIVE METABOLIC PANEL
CO2: 25 mEq/L (ref 19–32)
Calcium: 10.4 mg/dL (ref 8.4–10.5)
Creatinine, Ser: 1.64 mg/dL — ABNORMAL HIGH (ref 0.50–1.10)
Glucose, Bld: 113 mg/dL — ABNORMAL HIGH (ref 70–99)

## 2011-05-24 LAB — CBC
MCH: 29.3 pg (ref 26.0–34.0)
MCHC: 33 g/dL (ref 30.0–36.0)
Platelets: 209 10*3/uL (ref 150–400)
RBC: 3.58 MIL/uL — ABNORMAL LOW (ref 3.87–5.11)

## 2011-05-24 LAB — CK TOTAL AND CKMB (NOT AT ARMC)
CK, MB: 3.1 ng/mL (ref 0.3–4.0)
Total CK: 161 U/L (ref 7–177)

## 2011-05-24 LAB — IRON AND TIBC
Saturation Ratios: 23 % (ref 20–55)
UIBC: 260 ug/dL

## 2011-05-25 LAB — CBC
MCH: 29.2 pg (ref 26.0–34.0)
MCHC: 32.7 g/dL (ref 30.0–36.0)
RDW: 12.8 % (ref 11.5–15.5)

## 2011-05-25 LAB — GLUCOSE, CAPILLARY: Glucose-Capillary: 115 mg/dL — ABNORMAL HIGH (ref 70–99)

## 2011-06-01 NOTE — H&P (Signed)
[name], [name] NO.:  [id number]  MEDICAL RECORD NO.:  [id number]  LOCATION:  [facility]                         FACILITY:  [facility]  PHYSICIAN:  Cassell Clement, M.D. DATE OF BIRTH:  [birth year]  DATE OF ADMISSION:  05/23/2011 DATE OF DISCHARGE:                             HISTORY & PHYSICAL   CARDIOLOGIST:  Duke Salvia, MD, [facility]  PRIMARY CARE PROVIDER:  Dario Guardian, MD  CHIEF COMPLAINT:  Near syncope.  HISTORY:  This pleasant 65 year old African American woman is admitted from the [facility] emergency room after coming to the emergency room with complaints of a heavy feeling in her chest and a feeling of near syncope.  Her symptoms of near syncope began yesterday while she was hanging some clothes.  She did not feel well last evening nor this morning so she decided to come to the emergency room.  She has not had any diaphoresis or nausea.  The chest heaviness has subsequently subsided.  Her initial set of cardiac enzymes is negative.  Her electrocardiogram is not helpful because of paced atrial and ventricular rhythm.  The patient has a history of recent bright red blood in her stools.  She has been attributing this to hemorrhoids.  She had a remote colonoscopy by Dr. Evette Cristal but no recent colonoscopy.  She has not seen Dr. Katrinka Blazing with these complaints.  The patient has a past history of sarcoidosis.  She states that she was treated with prednisone for several years.  Recently, her optometrist told her that the sarcoid might be coming back in her eyes and that she might have to go back on steroids.  She also sees Dr. Dione Booze but has not seen him recently.  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  She has a Scientist, research (life sciences).  She has had a cardiac catheterization as recently as 2006, which did not show  any coronary disease.  Her ejection fraction at that time was 35%.  The patient also has a history of prior atrial flutter in 2006, which was treated with ablation.  Her most recent echocardiogram was January 2011, showing an ejection fraction of 35-40%.  MEDICATIONS:  She is on: 1. Aspirin 81 mg daily. 2. Crestor 10 mg daily. 3. Losartan 12.5 mg daily. 4. Spironolactone 12.5 mg daily. 5. Lotemax 0.5% eyedrops and she takes 1 drop four times a day to each     eye. 6. She is on Lumigan 0.01% takes 1 drop to the right eye at bedtime.  ALLERGIES:  She is allergic to PENICILLIN and CODEINE.  SOCIAL HISTORY:  She is separated.  She has 5 children.  She is disabled from work because of her medical condition.  She quit smoking in 1990. She does not drink alcohol.  FAMILY HISTORY:  Her father died of a stroke.  Mother died of a stroke.  REVIEW OF SYSTEMS:  She has not experienced any fever or constitutional symptoms or weight change.  She has had no recent skin rash.  She denies any cough  or sputum production.  She has had urinary frequency and urgency.  She is not having any arthralgias.  She has had recent rectal bleeding noted on the tissue paper and also in the toilet bowl after defecation.  She is not presently being treated for anemia.  She does not have any history of diabetic condition or thyroid condition.  All other systems were reviewed and are negative.  PHYSICAL EXAMINATION:  VITAL SIGNS:  Blood pressure of 150/59, temperature 98, pulse is 68, respirations 18, O2 sat is 96. GENERAL APPEARANCE:  A well-developed, well-nourished African American woman in no distress. HEAD AND NECK:  Unremarkable.  Neck reveals no bruits. HEART:  No murmur, gallop, rub or click.  There is no abnormal lift or heave.  There is no lymphadenopathy. LUNGS:  Clear to percussion and auscultation. SKIN:  No rash. ABDOMEN:  Soft and nontender.  The liver is not enlarged.  There is an old large  midline lower abdominal incision from previous hysterectomy. EXTREMITIES:  No phlebitis or edema.  Pedal pulses are present.  LABS AND DIAGNOSTICS:  Chest x-ray shows cardiomegaly with dual pacer leads and no evidence of CHF.  Her electrocardiogram shows a AV paced rhythm.  The initial blood work shows a white count of 44,00, hemoglobin of 10.7, hematocrit 31.7, MCV of 88.5, MCH of 29.9, MCHC of 33.8, platelet count 230,000, eosinophil count 6.  Sodium 140, potassium 4.4, BUN 27, creatinine 1.8, blood sugar 99.  Urinalysis is unremarkable. Initial troponin I is negative.  Chest x-ray shows stable cardiomegaly and no acute cardiopulmonary disease.  IMPRESSION: 1. Chest pressure, uncertain etiology with initial cardiac enzymes     negative and past history of normal cardiac catheterization with no     demonstrable coronary artery disease. 2. History of near syncope, uncertain etiology, doubt pacer     malfunction but we will also interrogate the pacer in a.m. 3. Malaise and fatigue. 4. Hematochezia and anemia. 5. History of sarcoidosis and her serum calcium is mildly elevated at     10.6.  DISPOSITION:  We are admitting to telemetry.  We will cycle enzymes.  We will need to contact pacer rep for St. Jude pacer in a.m. to interrogate pacer.  We will get serial CBCs.  Consider GI consult for evaluation of history of hematochezia.  We will get an anemia panel to characterize her anemia further.  We will follow serum calciums regarding her sarcoidosis.          ______________________________ Cassell Clement, M.D.     TB/MEDQ  D:  05/23/2011  T:  05/24/2011  Job:  [id number]  Electronically Signed by Cassell Clement M.D. on 06/01/2011 02:58:43 PM

## 2011-06-16 NOTE — Discharge Summary (Signed)
[name], [name] NO.:  [id number]  MEDICAL RECORD NO.:  [id number]  LOCATION:  2009                         FACILITY:  [facility]  PHYSICIAN:  Vesta Mixer, M.D. DATE OF BIRTH:  04/10/1946  DATE OF ADMISSION:  05/23/2011 DATE OF DISCHARGE:  05/25/2011                              DISCHARGE SUMMARY   PRIMARY CARDIOLOGIST:  Duke Salvia, MD, [facility]  PRIMARY CARE PROVIDER:  Dario Guardian, MD  CONSULTING GASTROENTEROLOGIST:  Everardo All. Madilyn Fireman, MD  DISCHARGE DIAGNOSIS:  Presyncope.  SECONDARY DIAGNOSES: 1. Chest pain without objective evidence of ischemia. 2. Bright red blood per rectum. 3. Lower abdominal pain. 4. Cardiac sarcoidosis. 5. History of complete heart block status post St. Jude permanent     pacemaker with normal interrogation this admission. 6. History of atrial flutter status post ablation. 7. Atrial tachycardia noted on pacemaker interrogation this admission. 8. Nonischemic cardiomyopathy with ejection fraction of 35-40%,     January 2011.  ALLERGIES: 1. PENICILLIN. 2. CODEINE.  PROCEDURES:  None.  HISTORY OF PRESENT ILLNESS:  A 65 year old African American female with the above complex problem list.  The patient was in her usual state of health until May 22, 2011, when she noted intermittent presyncope followed by general malaise and chest heaviness.  She presented to [facility] ED on August 17 where ECG was nonacute and point-of-care markers were negative.  In the ED, she also reported intermittent bright red blood per rectum.  She was admitted for further evaluation.  HOSPITAL COURSE:  The patient ruled out for MI and had no further chest pain or presyncope.  Her pacemaker was interrogated and showed normal device function though she did have 623 atrial episodes in the past 164 days, the longest of which was 7 minutes with a peak atrial rate 366 beats per minute.  Total atrial tachycardia/atrial fibrillation burden was less  than 1% and the majority of atrial tachycardias were 10-40 seconds in duration.  Last episode occurred on August 15, one day prior to her development of presyncope.  No changes were made to her device.  In the setting of complaints of bright red blood per rectum and also lower abdominal discomfort which developed on August 19, Gastroenterology was consulted.  The patient was seen and felt to be stable for discharge with plan for an outpatient colonoscopy which will be arranged by the office of Dr. Dorena Cookey.  Notably, her H and H have been stable.  She will be discharged home today in good condition.  DISCHARGE LABORATORY DATA:  Hemoglobin 11.1, hematocrit 33.9, WBC 4.9, and platelets 227.  Sodium 139, potassium 3.9, chloride 106, CO2 of 25, BUN 25, creatinine 1.64, and glucose 113.  Total bilirubin 0.2, alkaline phosphatase 58, AST 22, ALT 16, total protein 7.0, albumin 3.4, calcium 10.4, magnesium 1.8.  CK 161, MB 3.1, and troponin I less than 0.30. TSH 2.271.  Serum iron 76, TIBC 336, UIBC 260, vitamin B12 of 62, folate 16, and ferritin 176.  Urinalysis was negative.  DISPOSITION:  The patient will be discharged home today in good condition.  FOLLOWUP PLANS AND APPOINTMENTS:  The patient will be contacted by the office of Dr.  John C. Hayes for set up with colonoscopy.  The patient will follow up with Dr. Graciela Husbands in approximately 4 weeks and follow up with primary care provider as previously scheduled.  DISCHARGE MEDICATIONS: 1. Aspirin 81 mg daily. 2. Nitroglycerin 0.4 mg sublingual p.r.n. chest pain. 3. Crestor 10 mg daily. 4. Losartan 25 mg half tab daily. 5. Loteprednol 0.5% drops 1 drop in both eyes q.i.d. 6. Lumigan 0.01% 1 drop both eyes daily. 7. Spirolactone 25 mg half tablet daily.  OUTSTANDING LABORATORY DATA AND STUDIES:  None.  DURATION OF DISCHARGE ENCOUNTER:  35 minutes including physician time.     Nicolasa Ducking,  ANP   ______________________________ Vesta Mixer, M.D.    CB/MEDQ  D:  05/25/2011  T:  05/25/2011  Job:  [id number]  cc:   Everardo All. Madilyn Fireman, M.D. Dario Guardian, M.D.  Electronically Signed by Nicolasa Ducking ANP on 05/29/2011 03:02:54 PM Electronically Signed by Kristeen Miss M.D. on 06/16/2011 07:35:38 PM

## 2011-06-20 ENCOUNTER — Ambulatory Visit (INDEPENDENT_AMBULATORY_CARE_PROVIDER_SITE_OTHER): Payer: PRIVATE HEALTH INSURANCE | Admitting: Physician Assistant

## 2011-06-20 ENCOUNTER — Encounter: Payer: Self-pay | Admitting: Physician Assistant

## 2011-06-20 VITALS — BP 106/72 | HR 68 | Ht 63.0 in | Wt 189.0 lb

## 2011-06-20 DIAGNOSIS — I5022 Chronic systolic (congestive) heart failure: Secondary | ICD-10-CM

## 2011-06-20 DIAGNOSIS — I498 Other specified cardiac arrhythmias: Secondary | ICD-10-CM

## 2011-06-20 DIAGNOSIS — I471 Supraventricular tachycardia: Secondary | ICD-10-CM | POA: Insufficient documentation

## 2011-06-20 DIAGNOSIS — N289 Disorder of kidney and ureter, unspecified: Secondary | ICD-10-CM

## 2011-06-20 DIAGNOSIS — Z950 Presence of cardiac pacemaker: Secondary | ICD-10-CM

## 2011-06-20 DIAGNOSIS — R079 Chest pain, unspecified: Secondary | ICD-10-CM | POA: Insufficient documentation

## 2011-06-20 LAB — BASIC METABOLIC PANEL
BUN: 36 mg/dL — ABNORMAL HIGH (ref 6–23)
Creatinine, Ser: 1.9 mg/dL — ABNORMAL HIGH (ref 0.4–1.2)
GFR: 35.41 mL/min — ABNORMAL LOW (ref >60.00)
Glucose, Bld: 99 mg/dL (ref 70–99)
Potassium: 4.7 mEq/L (ref 3.5–5.1)

## 2011-06-20 MED ORDER — FAMOTIDINE 20 MG PO TABS: 20.0000 mg | ORAL_TABLET | Freq: Two times a day (BID) | ORAL | Status: DC

## 2011-06-20 NOTE — Assessment & Plan Note (Signed)
Volume stable. 

## 2011-06-20 NOTE — Assessment & Plan Note (Signed)
Creatinine 1.6-1.8 during her hospitalization.  Repeat basic metabolic panel today.  We discussed followup with her PCP to follow her chronic kidney disease.  We also discussed avoiding nonsteroidal anti-inflammatory drugs.

## 2011-06-20 NOTE — Progress Notes (Signed)
History of Present Illness: Primary Electrophysiologist:  Dr. Sherryl Manges PCP:  Dr. Merri Brunette  [name] is a 65 y.o. female who presents for post hospital follow up.  She has a history of nonischemic cardiomyopathy secondary cardiac sarcoid, status post pacemaker implantation for complete heart block.  Left heart catheterization 2/06: Lymph node irregularities, no obstructive CAD.  Last echocardiogram 1/11: Moderate LVH, EF 35-40%, grade 1 diastolic dysfunction, mild biatrial enlargement.  She was admitted 8/17-8/19.  She presented with near-syncope and chest heaviness.  Myocardial infarction was ruled out.  Her pacemaker was interrogated and demonstrated 623 atrial episodes in.  The longest was 7 minutes.  Total atrial tachycardia/atrial fibrillation burden was less than 1% with the majority of atrial tachycardias lasting 10-40 seconds in duration.  Her last episode occurred one day prior to her development of her presenting symptoms.  No changes were made in the settings of her device.  She did complain of bright red blood per rectum and was seen by gastroenterology.  Her hemoglobin remained stable and plans were made for outpatient colonoscopy with Dr. Dorena Cookey.  No further cardiac workup was pursued.  Labs: Hemoglobin 11.1, potassium 3.9, creatinine 1.64, ALT 16, TSH 2.271, iron 76, cardiac markers negative x3.  Chest x-ray: Cardiomegaly, no active disease.  She is still having occasional chest tightness.  This sometimes occurs with meals.  It sometimes occurs with exertion.  It may last a few seconds.  She has no associated radiating symptoms.  She does note dyspnea with exertion.  This is mild and class II-IIb.  She denies orthopnea, PND or edema.  She denies syncope.  She feels her heart skip when she bends over.  She has not tried nitroglycerin or TUMS.  Past Medical History  Diagnosis Date  . NICM (nonischemic cardiomyopathy)     echo 1/11: EF 35-40%, mod LVH, Grade 1 diast  dysfxn, mild BAE;    cath 2/06: luminal irregs  . Pacemaker     pacesetter. 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 Chronic systolic heart failure   . HTN (hypertension)   . HLD (hyperlipidemia)   . Sarcoidosis   . Complete heart block   . Atrial tachycardia   . Diverticulosis   . CKD (chronic kidney disease)     Current Outpatient Prescriptions  Medication Sig Dispense Refill  . aspirin 81 MG tablet Take 81 mg by mouth daily.        . bimatoprost (LUMIGAN) 0.01 % SOLN 1 drop as directed.        . Calcium Carbonate (CALTRATE 600 PO) Take by mouth daily.        . carvedilol (COREG) 25 MG tablet Take 1 tablet (25 mg total) by mouth 2 (two) times daily.  60 tablet  11  . losartan (COZAAR) 25 MG tablet Take 1/2 tablet(s) once daily  30 tablet  6  . loteprednol (LOTEMAX) 0.5 % ophthalmic suspension Place 1 drop into both eyes 3 (three) times daily.        . nitroGLYCERIN (NITROSTAT) 0.4 MG SL tablet Place 0.4 mg under the tongue every 5 (five) minutes as needed.        . rosuvastatin (CRESTOR) 10 MG tablet Take 10 mg by mouth daily.        Marland Kitchen spironolactone (ALDACTONE) 25 MG tablet Take 1/2 tablet(s) once daily  30 tablet  6  . famotidine (PEPCID) 20 MG tablet Take 1 tablet (20 mg total) by mouth 2 (two)  times daily.  60 tablet  6    Allergies: Allergies  Allergen Reactions  . Codeine   . Penicillins     Social history:Ex-smoker  ROS:  Please see the history of present illness.  She was diagnosed with diverticulosis at colonoscopy a couple weeks ago.  She notes an occasional wheeze.  She has a chronic cough is nonproductive.  Otherwise, all other systems reviewed and negative.   Vital Signs: BP 106/72  Pulse 68  Ht 5\' 3"  (1.6 m)  Wt 189 lb (85.73 kg)  BMI 33.48 kg/m2  PHYSICAL EXAM: Well nourished, well developed, in no acute distress HEENT: normal Neck: no JVD Cardiac:  normal S1, S2; RRR; no murmur Lungs:  clear to auscultation bilaterally, no wheezing,  rhonchi or rales Abd: soft, nontender, no hepatomegaly Ext: no edema Skin: warm and dry Neuro:  CNs 2-12 intact, no focal abnormalities noted Psych: Normal affect  EKG:  AV paced, heart rate 68  ASSESSMENT AND PLAN:

## 2011-06-20 NOTE — Assessment & Plan Note (Signed)
Atypical.  I suspect this is likely acid reflux disease.  However, she does have some exertional symptoms.  She had a heart catheterization some 6 years ago.  Proceed with Myoview to rule out ischemic heart disease.  I will also place her on Pepcid 20 mg twice a day.

## 2011-06-20 NOTE — Patient Instructions (Signed)
Your physician recommends that you schedule a follow-up appointment in: 3 MONTHS WITH DR. Graciela Husbands AS PER SCOTT WEAVER, PA-C.  Your physician has requested that you have a lexiscan myoview DX [phone] CHEST PAIN. For further information please visit [email or web address] Please follow instruction sheet, as given.   Your physician recommends that you return for lab work in: TODAY BMET HEART FAILURE AND RENAL INSUFFICIENCY  Your physician has recommended you make the following change in your medication: START PEPCID 20 MG 1 TABLET TWICE DAILY

## 2011-06-20 NOTE — Assessment & Plan Note (Signed)
Followup with Dr. Graciela Husbands in 3 months.

## 2011-06-20 NOTE — Assessment & Plan Note (Signed)
This was not felt to be significant by interrogation of her device during her hospitalization.  Continue current therapy.

## 2011-06-25 ENCOUNTER — Encounter: Payer: Self-pay | Admitting: *Deleted

## 2011-06-25 ENCOUNTER — Telehealth: Payer: Self-pay | Admitting: *Deleted

## 2011-06-25 DIAGNOSIS — I5022 Chronic systolic (congestive) heart failure: Secondary | ICD-10-CM

## 2011-06-25 NOTE — Telephone Encounter (Signed)
pt aware of lab results and to d/c aldactone and repeat bmet 07/03/11 and to weigh daily and call if > 3 lb or more. Danielle Rankin

## 2011-06-29 NOTE — Consult Note (Signed)
  [name], [name] NO.:  [id number]  MEDICAL RECORD NO.:  [id number]  LOCATION:  2009                         FACILITY:  [facility]  PHYSICIAN:  Derionna Salvador C. Madilyn Fireman, M.D.    DATE OF BIRTH:  [birth year]  DATE OF CONSULTATION:  05/25/2011 DATE OF DISCHARGE:                                CONSULTATION   REASON FOR CONSULTATION:  Lower abdominal pain and rectal bleeding.  HISTORY OF PRESENT ILLNESS:  The patient is a 65 year old black female admitted for near syncopal episode with chest pressure with negative workup was scheduled for discharge today but complained to the admitting physician of abdominal pain and rectal bleeding.  Consultation is requested.  The patient states she has had intermittent bilateral lower quadrant pain radiating to the back for over a year ago, got gradually worse not associated to meals or bowel movements.  Independently of that, she sees intermittent rectal bleeding that she always attribute to hemorrhoid and states that it has gotten more frequent lately.  She denies any weight loss or change in stool caliber.  She had a negative barium enema in 2006.  Her hemoglobin on this admission was 11.2.  PAST MEDICAL HISTORY:  St. Jude pacemaker implanted, cardiac sarcoid, no coronary artery disease by cardiac catheterization.  MEDICATIONS:  Aspirin, Crestor, losartan, spironolactone.  ALLERGIES:  PENICILLIN, CODEINE.  SOCIAL HISTORY:  The patient is separated.  She has 5 children.  She quit smoking in 1990s.  Does not drink alcohol.  PHYSICAL EXAMINATION:  GENERAL:  Well-developed, well-nourished black female in no acute stress. HEART:  Regular rate and rhythm without murmurs. LUNGS:  Clear. ABDOMEN:  Soft, nondistended with normoactive bowel sounds.  No hepatosplenomegaly, mass or guarding.  There is a well healed surgical scar from previous hysterectomy.  IMPRESSION:  Intermittent lower abdominal pain, rectal bleeding last colonoscopy 6  years ago.  PLAN:  We will proceed with colonoscopy which the patient and I are comfortable in doing it as an outpatient after discharge.  Our office will contact her tomorrow to set this up.          ______________________________ Everardo All. Madilyn Fireman, M.D.     JCH/MEDQ  D:  05/25/2011  T:  05/25/2011  Job:  [id number]  cc:   Cassell Clement, M.D.  Electronically Signed by Dorena Cookey M.D. on 06/29/2011 11:39:38 AM

## 2011-07-03 ENCOUNTER — Other Ambulatory Visit (INDEPENDENT_AMBULATORY_CARE_PROVIDER_SITE_OTHER): Payer: PRIVATE HEALTH INSURANCE | Admitting: *Deleted

## 2011-07-03 DIAGNOSIS — I5022 Chronic systolic (congestive) heart failure: Secondary | ICD-10-CM

## 2011-07-04 ENCOUNTER — Telehealth: Payer: Self-pay | Admitting: *Deleted

## 2011-07-04 DIAGNOSIS — I5022 Chronic systolic (congestive) heart failure: Secondary | ICD-10-CM

## 2011-07-04 LAB — BASIC METABOLIC PANEL
CO2: 27 mEq/L (ref 19–32)
Calcium: 10.5 mg/dL (ref 8.4–10.5)
Creatinine, Ser: 2.1 mg/dL — ABNORMAL HIGH (ref 0.4–1.2)
GFR: 31.1 mL/min — ABNORMAL LOW (ref >60.00)
Glucose, Bld: 93 mg/dL (ref 70–99)
Sodium: 140 mEq/L (ref 135–145)

## 2011-07-04 NOTE — Telephone Encounter (Signed)
pt aware of lab results and to d/c losartan , repeat bmet 07/08/11, labs faxed to PCP today. Danielle Rankin

## 2011-07-08 ENCOUNTER — Ambulatory Visit (HOSPITAL_COMMUNITY): Payer: PRIVATE HEALTH INSURANCE | Attending: Physician Assistant | Admitting: Radiology

## 2011-07-08 ENCOUNTER — Other Ambulatory Visit (INDEPENDENT_AMBULATORY_CARE_PROVIDER_SITE_OTHER): Payer: PRIVATE HEALTH INSURANCE | Admitting: *Deleted

## 2011-07-08 VITALS — Ht 63.0 in | Wt 186.0 lb

## 2011-07-08 DIAGNOSIS — R55 Syncope and collapse: Secondary | ICD-10-CM

## 2011-07-08 DIAGNOSIS — I5022 Chronic systolic (congestive) heart failure: Secondary | ICD-10-CM

## 2011-07-08 DIAGNOSIS — R079 Chest pain, unspecified: Secondary | ICD-10-CM | POA: Insufficient documentation

## 2011-07-08 DIAGNOSIS — R0789 Other chest pain: Secondary | ICD-10-CM

## 2011-07-08 LAB — BASIC METABOLIC PANEL
BUN: 26 mg/dL — ABNORMAL HIGH (ref 6–23)
Chloride: 105 mEq/L (ref 96–112)
Creatinine, Ser: 1.9 mg/dL — ABNORMAL HIGH (ref 0.4–1.2)
GFR: 33.52 mL/min — ABNORMAL LOW (ref >60.00)
Potassium: 4.5 mEq/L (ref 3.5–5.1)

## 2011-07-08 MED ORDER — TECHNETIUM TC 99M TETROFOSMIN IV KIT
11.0000 | PACK | Freq: Once | INTRAVENOUS | Status: AC | PRN
  Administered 2011-07-08: 11 via INTRAVENOUS

## 2011-07-08 MED ORDER — ADENOSINE (DIAGNOSTIC) 3 MG/ML IV SOLN
0.5600 mg/kg | Freq: Once | INTRAVENOUS | Status: AC
  Administered 2011-07-08: 47.4 mg via INTRAVENOUS

## 2011-07-08 MED ORDER — TECHNETIUM TC 99M TETROFOSMIN IV KIT
33.0000 | PACK | Freq: Once | INTRAVENOUS | Status: AC | PRN
  Administered 2011-07-08: 33 via INTRAVENOUS

## 2011-07-08 NOTE — Progress Notes (Signed)
[facility] SITE 3 NUCLEAR MED [address] [place] [address] [phone]  Cardiology Nuclear Med Study  [name] is a 65 y.o. female [id number] January 10, 1946   Nuclear Med Background Indication for Stress Test:  Evaluation for Ischemia and 05/25/11 Post Hospital with Near Syncope and CP, MI R/O History:  Ablation and '98 PTVP; '06 ONG:[id number] ischemia, EF=38%>Cath:No sig. CAD, EF=35%; '06 Ablation; '11 Echo:EF=35-40%, moderate LVH; H/O Atrial Flutter and NICM Cardiac Risk Factors: Family History - CAD, History of Smoking, Hypertension, Lipids and Obesity  Symptoms:  Chest Pain/Tightness with and without Exertion (last episode of chest discomfort:none since discharge), DOE, Fatigue, Near Syncope, Palpitations and Rapid HR   Nuclear Pre-Procedure Caffeine/Decaff Intake:  None NPO After: 7:00pm   Lungs:  Clear.  O2 SAT 98% IV 0.9% NS with Angio Cath:  22g  IV Site: R Hand  IV Started by:  Cathlyn Parsons, RN  Chest Size (in):  40 Cup Size: D  Height: 5\' 3"  (1.6 m)  Weight:  186 lb (84.369 kg)  BMI:  Body mass index is 32.95 kg/(m^2). Tech Comments:  Coreg held x 14 hours    Nuclear Med Study 1 or 2 day study: 1 day  Stress Test Type:  Adenosine  Reading MD: Willa Rough, MD  Order Authorizing Provider:  Berton Mount, MD; Tereso Newcomer, PA-C  Resting Radionuclide: Technetium 27m Tetrofosmin  Resting Radionuclide Dose: 11 mCi   Stress Radionuclide:  Technetium 50m Tetrofosmin  Stress Radionuclide Dose: 33 mCi           Stress Protocol Rest HR: 68 Stress HR: 69  Rest BP: 106/39 Stress BP: 99/59  Exercise Time (min): n/a METS: n/a   Predicted Max HR: 157 bpm % Max HR: 43.95 bpm Rate Pressure Product: 6831   Dose of Adenosine (mg):  47.3 Dose of Lexiscan: n/a mg  Dose of Atropine (mg): n/a Dose of Dobutamine: n/a mcg/kg/min (at max HR)  Stress Test Technologist: Smiley Houseman, CMA-N  Nuclear Technologist:  Domenic Polite, CNMT     Rest  Procedure:  Myocardial perfusion imaging was performed at rest 45 minutes following the intravenous administration of Technetium 41m Tetrofosmin.  Rest ECG: Atrial Paced.  Stress Procedure:  The patient received IV adenosine at 140 mcg/kg/min for 4 minutes.  There were no significant changes with infusion.  Technetium 61m Tetrofosmin was injected at the 2 minute mark and quantitative spect images were obtained after a 45 minute delay.  Stress ECG: No significant ST segment change suggestive of ischemia.  QPS Raw Data Images:  Normal; no motion artifact; normal heart/lung ratio. Stress Images:  Normal homogeneous uptake in all areas of the myocardium. Rest Images:  Normal homogeneous uptake in all areas of the myocardium. Subtraction (SDS):  No evidence of ischemia. Transient Ischemic Dilatation (Normal <1.22):  1.05 Lung/Heart Ratio (Normal <0.45):  1.05  Quantitative Gated Spect Images QGS EDV:  124 ml QGS ESV:  78 ml QGS cine images:  Global LV dysfunction. QGS EF: 37%  Impression Exercise Capacity:  Adenosine study with no exercise. BP Response:  Normal blood pressure response. Clinical Symptoms:  Dizzy ECG Impression:  No significant ST segment change suggestive of ischemia. Comparison with Prior Nuclear Study: No significant change from previous study  Overall Impression:  There is no change since the study of 11/2004.There is no obvious scar or ischemia. Wall motion is abnormal.  Willa Rough

## 2011-07-09 ENCOUNTER — Other Ambulatory Visit (HOSPITAL_COMMUNITY): Payer: Self-pay | Admitting: Radiology

## 2011-07-09 DIAGNOSIS — R079 Chest pain, unspecified: Secondary | ICD-10-CM

## 2011-07-22 ENCOUNTER — Ambulatory Visit (INDEPENDENT_AMBULATORY_CARE_PROVIDER_SITE_OTHER): Payer: PRIVATE HEALTH INSURANCE | Admitting: Physician Assistant

## 2011-07-22 ENCOUNTER — Encounter: Payer: Self-pay | Admitting: Physician Assistant

## 2011-07-22 DIAGNOSIS — I5022 Chronic systolic (congestive) heart failure: Secondary | ICD-10-CM

## 2011-07-22 LAB — BASIC METABOLIC PANEL
CO2: 27 mEq/L (ref 19–32)
Chloride: 108 mEq/L (ref 96–112)
Glucose, Bld: 85 mg/dL (ref 70–99)
Potassium: 4.6 mEq/L (ref 3.5–5.1)
Sodium: 140 mEq/L (ref 135–145)

## 2011-07-22 NOTE — Progress Notes (Signed)
History of Present Illness: Primary Electrophysiologist:  Dr. Sherryl Manges PCP:  Dr. Merri Brunette  [name] is a 65 y.o. female who presents for follow up.  She has a history of nonischemic cardiomyopathy secondary cardiac sarcoid, status post pacemaker implantation for complete heart block.  Left heart catheterization 2/06: Lymph node irregularities, no obstructive CAD.  Last echocardiogram 1/11: Moderate LVH, EF 35-40%, grade 1 diastolic dysfunction, mild biatrial enlargement.  Admitted in 8/12 with near-syncope and chest heaviness.  MI ruled out.  Pacer interrogation demonstrated 623 atrial episodes with longest duration 7 minutes.  Total atrial tachycardia/atrial fibrillation burden was less than 1% with the majority of atrial tachycardias lasting 10-40 seconds in duration.  No changes were made in the settings of her device.  I saw her in follow up 9/14 and she still had some chest tightness.  A myoview was arranged and done 10/2: no scar or ischemia, EF 37% (which was consistent with prior EF on echocardiogram).  She was noted to have worsening creatinines and her spironolactone and Losartan were both discontinued.  She returns today to review her medications for treatment of systolic CHF.  Labs (8/12): creatinine 1.64 Labs (06/20/11): K 4.7, creatinine 1.9 Labs (07/03/11): K 4.8, creatinine 2.1 Labs (07/08/11): K 4.5, creatinine 1.9  The patient denies chest pain, syncope, orthopnea, PND or significant pedal edema.  She has chronic DOE that is mild and unchanged.  Notes class 2-2b symptoms.  Has an occasional wheeze and uses bronchodilators.      Past Medical History  Diagnosis Date  . NICM (nonischemic cardiomyopathy)     echo 1/11: EF 35-40%, mod LVH, Grade 1 diast dysfxn, mild BAE;    cath 2/06: luminal irregs;  Myoview 10/2:  no scar or ischemia, EF 37%   . Pacemaker     pacesetter. 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 Chronic systolic heart failure   . HTN  (hypertension)   . HLD (hyperlipidemia)   . Sarcoidosis   . Complete heart block   . Atrial tachycardia   . Diverticulosis   . CKD (chronic kidney disease)     Current Outpatient Prescriptions  Medication Sig Dispense Refill  . aspirin 81 MG tablet Take 81 mg by mouth daily.        . bimatoprost (LUMIGAN) 0.01 % SOLN 1 drop as directed.        . Calcium Carbonate (CALTRATE 600 PO) Take by mouth daily.        . carvedilol (COREG) 25 MG tablet Take 1 tablet (25 mg total) by mouth 2 (two) times daily.  60 tablet  11  . famotidine (PEPCID) 20 MG tablet Take 1 tablet (20 mg total) by mouth 2 (two) times daily.  60 tablet  6  . loteprednol (LOTEMAX) 0.5 % ophthalmic suspension Place 1 drop into both eyes 3 (three) times daily.        . nitroGLYCERIN (NITROSTAT) 0.4 MG SL tablet Place 0.4 mg under the tongue every 5 (five) minutes as needed.        . rosuvastatin (CRESTOR) 10 MG tablet Take 10 mg by mouth daily.          Allergies: Allergies  Allergen Reactions  . Codeine   . Penicillins     Social history:  Ex-smoker  Vital Signs: BP 106/64  Pulse 68  Ht 5\' 3"  (1.6 m)  Wt 191 lb (86.637 kg)  BMI 33.83 kg/m2  PHYSICAL EXAM: Well nourished, well developed, in  no acute distress HEENT: normal Neck: no JVD Cardiac:  normal S1, S2; RRR; no murmur Lungs:  clear to auscultation bilaterally, no wheezing, rhonchi or rales Abd: soft, nontender, no hepatomegaly Ext: no edema Skin: warm and dry Neuro:  CNs 2-12 intact, no focal abnormalities noted Psych: Normal affect  EKG:  AV paced, heart rate 68  ASSESSMENT AND PLAN:

## 2011-07-22 NOTE — Assessment & Plan Note (Signed)
Worsening renal function is limiting treatment.  Volume status currently stable.  I discussed with Dr. Graciela Husbands.  We will decrease her coreg to 12.5 mg BID to increase BP some to see if this helps renal perfusion and decreases creatinine.  Will try to restart Losartan 25 mg 1/2 QD and follow creatinine closely to see if she can tolerate it.  Follow up with Dr. Graciela Husbands in January as scheduled.

## 2011-07-22 NOTE — Patient Instructions (Signed)
Your physician recommends that you schedule a follow-up appointment in: WITH DR. Graciela Husbands IN 10/2011  Your physician recommends that you return for lab work in: TODAY BMET 428.22 HEART FAILURE  Your physician recommends that you return for lab work in: 07/29/11 REPEAT BMET 428.22 HEART FAILURE  Your physician has recommended you make the following change in your medication: DECREASE COREG 12.5 MG TWICE DAILY; RESTART LOSARTAN 25 MG TAKE 1/2 (HALF) TABLET DAILY  You have been referred to NEPHROLOGY FOR RENAL INSUFFICIENCY 585.3

## 2011-07-22 NOTE — Assessment & Plan Note (Signed)
Restart Losartan.  Check BMET today and repeat in a week.  Refer to nephrology.

## 2011-07-29 ENCOUNTER — Other Ambulatory Visit (INDEPENDENT_AMBULATORY_CARE_PROVIDER_SITE_OTHER): Payer: PRIVATE HEALTH INSURANCE | Admitting: *Deleted

## 2011-07-29 DIAGNOSIS — N183 Chronic kidney disease, stage 3 unspecified: Secondary | ICD-10-CM

## 2011-07-29 DIAGNOSIS — I5022 Chronic systolic (congestive) heart failure: Secondary | ICD-10-CM

## 2011-07-29 LAB — BASIC METABOLIC PANEL
BUN: 25 mg/dL — ABNORMAL HIGH (ref 6–23)
CO2: 27 mEq/L (ref 19–32)
Chloride: 108 mEq/L (ref 96–112)
Creatinine, Ser: 1.6 mg/dL — ABNORMAL HIGH (ref 0.4–1.2)
Potassium: 4.3 mEq/L (ref 3.5–5.1)

## 2011-10-10 ENCOUNTER — Encounter: Payer: Self-pay | Admitting: Internal Medicine

## 2011-10-10 ENCOUNTER — Ambulatory Visit (INDEPENDENT_AMBULATORY_CARE_PROVIDER_SITE_OTHER): Payer: PRIVATE HEALTH INSURANCE | Admitting: Internal Medicine

## 2011-10-10 DIAGNOSIS — I471 Supraventricular tachycardia: Secondary | ICD-10-CM

## 2011-10-10 DIAGNOSIS — I5022 Chronic systolic (congestive) heart failure: Secondary | ICD-10-CM

## 2011-10-10 DIAGNOSIS — I442 Atrioventricular block, complete: Secondary | ICD-10-CM

## 2011-10-10 DIAGNOSIS — I498 Other specified cardiac arrhythmias: Secondary | ICD-10-CM

## 2011-10-10 DIAGNOSIS — Z950 Presence of cardiac pacemaker: Secondary | ICD-10-CM

## 2011-10-10 LAB — BASIC METABOLIC PANEL
BUN: 32 mg/dL — ABNORMAL HIGH (ref 6–23)
CO2: 28 mEq/L (ref 19–32)
Chloride: 104 mEq/L (ref 96–112)
Potassium: 4.4 mEq/L (ref 3.5–5.1)

## 2011-10-10 LAB — PACEMAKER DEVICE OBSERVATION
AL IMPEDENCE PM: 449 Ohm
ATRIAL PACING PM: 86
BAMS-0001: 180 {beats}/min
RV LEAD IMPEDENCE PM: 345 Ohm
VENTRICULAR PACING PM: 99

## 2011-10-10 MED ORDER — SPIRONOLACTONE 25 MG PO TABS: ORAL_TABLET | ORAL | Status: DC

## 2011-10-10 MED ORDER — FUROSEMIDE 20 MG PO TABS: ORAL_TABLET | ORAL | Status: DC

## 2011-10-10 NOTE — Patient Instructions (Signed)
Your physician has recommended you make the following change in your medication:  1) Take lasix (furosemide) 20mg  one tablet by mouth once daily for the next 4 days. 2) Start aldactone (spironolactone) 25mg  1/2 tablet by mouth every other day.  Your physician recommends that you have lab work: today and in 3 weeks: bmp  Your physician recommends that you schedule a follow-up appointment in: 3 weeks with Tereso Newcomer, PA

## 2011-10-10 NOTE — Progress Notes (Signed)
HPI  [name] is a 66 y.o. 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  She was seen over the summer because of presyncope. Pacemaker interrogation demonstrates an atrial high rate episodes. Myoview scanning demonstrated ejection fraction of 37% with no scar. This was consistent with prior testing.  It was noted that her creatinine level is moderately elevated and her Aldactone and ARB's were discontinued. They weree gradually resumed with downtitration of her beta blockers to allow for improvement of perfusion   She samples with increasing orthopnea and dyspnea over the last number of weeks. She has not noted any appreciable weight change. She has also had an intercurrent upper respiratory infection.      Past Medical History  Diagnosis Date  . NICM (nonischemic cardiomyopathy)     echo 1/11: EF 35-40%, mod LVH, Grade 1 diast dysfxn, mild BAE;    cath 2/06: luminal irregs;  Myoview 10/2:  no scar or ischemia, EF 37%   . Pacemaker     pacesetter. 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 Chronic systolic heart failure   . HTN (hypertension)   . HLD (hyperlipidemia)   . Sarcoidosis   . Complete heart block   . Atrial tachycardia   . Diverticulosis   . CKD (chronic kidney disease)     Past Surgical History  Procedure Date  . Av node ablation   . Status post dual chamber pacer     St. Jude model #5380, Identify    Current Outpatient Prescriptions  Medication Sig Dispense Refill  . aspirin 81 MG tablet Take 81 mg by mouth daily.        . bimatoprost (LUMIGAN) 0.01 % SOLN 1 drop as directed.        . Calcium Carbonate (CALTRATE 600 PO) Take by mouth daily.        . Calcium-Vitamin D (CALTRATE 600 PLUS-VIT D PO) Take 2 tablets by mouth daily.        . carvedilol (COREG) 25 MG tablet Take 0.5 tablets (12.5 mg total) by mouth 2 (two) times daily.      . famotidine (PEPCID)  20 MG tablet Take 1 tablet (20 mg total) by mouth 2 (two) times daily.  60 tablet  6  . losartan (COZAAR) 25 MG tablet Take 0.5 tablets (12.5 mg total) by mouth daily.      Marland Kitchen loteprednol (LOTEMAX) 0.5 % ophthalmic suspension Place 1 drop into both eyes 3 (three) times daily.        . nitroGLYCERIN (NITROSTAT) 0.4 MG SL tablet Place 0.4 mg under the tongue every 5 (five) minutes as needed.        . rosuvastatin (CRESTOR) 10 MG tablet Take 10 mg by mouth daily.          Allergies  Allergen Reactions  . Codeine   . Penicillins     Review of Systems negative except from HPI and PMH  Physical Exam Well developed and well nourished in no acute distress HENT normal E scleral and icterus clear Neck Supple JVP 8-9 cm; carotids brisk and full Clear to ausculation regular rate and rhythm a 2/6 murmur along the right sternal border  Regular rate and rhythm, no murmurs gallops or rub Soft with active bowel sounds No clubbing cyanosis none Edema Alert and oriented, grossly normal motor and sensory function Skin Warm and Dry  Interrogation of her device  demonstrates infrequent atrial fibrillation underlying complete heart block  Assessment and  Plan

## 2011-10-10 NOTE — Assessment & Plan Note (Signed)
See above

## 2011-10-10 NOTE — Assessment & Plan Note (Addendum)
She has recurrent shortness of breath which is likely a manifestation of heart failure. In the short term we will add Lasix for 4 days and then begin her on every other day spironolactone . Will have to follow her renal function closely.  It may well also be that resynchronization would be appropriate. That undertaking is not without risks will hold off on her for right now is if we can't treat her medically.  We'll have her see Tereso Newcomer in 3 weeks or so to see if she's doing. I also would anticipate referring her to the heart failure clinic thereafter.

## 2011-10-10 NOTE — Assessment & Plan Note (Signed)
The patient's device was interrogated.  The information was reviewed. No changes were made in the programming.    

## 2011-10-10 NOTE — Assessment & Plan Note (Signed)
As above.

## 2011-10-15 ENCOUNTER — Telehealth: Payer: Self-pay | Admitting: *Deleted

## 2011-10-15 ENCOUNTER — Telehealth: Payer: Self-pay | Admitting: Internal Medicine

## 2011-10-15 NOTE — Telephone Encounter (Signed)
10/15/11--pt calling wanting to know resul;ts of BMET--results given and advised to start her aldactone(which she had not started)  and f/u with dr Graciela Husbands at next o.v. This month--pt agrees--nt

## 2011-10-15 NOTE — Telephone Encounter (Signed)
Walk In Pt Form " Pt left New Phone #, also needs call back regarding Test results" Sent to Geroge Baseman  10/15/11/KM

## 2011-10-20 ENCOUNTER — Telehealth: Payer: Self-pay | Admitting: Internal Medicine

## 2011-10-20 NOTE — Telephone Encounter (Signed)
Walk In Pt Form " Pt needs Application for Disability Parking Placard completed" sent to  Message Nurse 10/20/11/KM

## 2011-10-23 ENCOUNTER — Telehealth: Payer: Self-pay | Admitting: *Deleted

## 2011-10-23 NOTE — Telephone Encounter (Signed)
I called the patient and made her aware that Dr. Graciela Husbands has signed her application for her handicapped placard. She will come pick this up at the front desk.

## 2011-10-29 ENCOUNTER — Other Ambulatory Visit: Payer: PRIVATE HEALTH INSURANCE | Admitting: *Deleted

## 2011-10-29 ENCOUNTER — Ambulatory Visit (INDEPENDENT_AMBULATORY_CARE_PROVIDER_SITE_OTHER): Payer: PRIVATE HEALTH INSURANCE | Admitting: Physician Assistant

## 2011-10-29 ENCOUNTER — Ambulatory Visit: Payer: PRIVATE HEALTH INSURANCE | Admitting: Physician Assistant

## 2011-10-29 ENCOUNTER — Encounter: Payer: Self-pay | Admitting: Physician Assistant

## 2011-10-29 DIAGNOSIS — I471 Supraventricular tachycardia: Secondary | ICD-10-CM

## 2011-10-29 DIAGNOSIS — I5022 Chronic systolic (congestive) heart failure: Secondary | ICD-10-CM

## 2011-10-29 DIAGNOSIS — I442 Atrioventricular block, complete: Secondary | ICD-10-CM

## 2011-10-29 DIAGNOSIS — Z950 Presence of cardiac pacemaker: Secondary | ICD-10-CM

## 2011-10-29 DIAGNOSIS — I498 Other specified cardiac arrhythmias: Secondary | ICD-10-CM

## 2011-10-29 LAB — BASIC METABOLIC PANEL
BUN: 31 mg/dL — ABNORMAL HIGH (ref 6–23)
CO2: 27 mEq/L (ref 19–32)
Calcium: 11 mg/dL — ABNORMAL HIGH (ref 8.4–10.5)
Glucose, Bld: 93 mg/dL (ref 70–99)
Potassium: 4.3 mEq/L (ref 3.5–5.1)
Sodium: 138 mEq/L (ref 135–145)

## 2011-10-29 MED ORDER — FUROSEMIDE 20 MG PO TABS: ORAL_TABLET | ORAL | Status: DC

## 2011-10-29 NOTE — Progress Notes (Signed)
HPI:  This is a 66 year old female patient who is here for follow-up after an office visit with Dr. Alberteen Spindle on 10/10/11 for orthopnea and dyspnea. She was given several days of Lasix and Aldactone was resumed.  The patient lost 4 pounds and is feeling a little better but still gets short of breath. She complains of dyspnea when walking one block. 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 She has to stop several times to catch her breath.The patient has had trouble with chronic renal insufficiency and her creatinine level was moderately elevated and her Aldactone and ARB's were discontinued. There are grossly resumed with down tried straight shin of her beta blockers to allow for improved perfusion. Last week her creatinine was 1.8 which was up from 1.6. She is scheduled for labs today.  She tries to watch her sodium intake but admits to eating crackers and some frozen foods and probably gets a lot more than she thinks.   This patient has a history of nonischemic cardiomyopathy secondary to sarcoid heart, status post his maker implantation for complete heart block. She had a left heart catheterization in 2006 no obstructive CAD. Last Myoview scan on 07/08/11 showed no scar or ischemia ejection fraction 37% consistent with prior EF on echo. Last echocardiogram was in 1/11 showed moderate LVH EF 35-40% with grade 1 diastolic dysfunction and mild bi-atrial margin.    Allergies  Allergen Reactions  . Codeine   . Penicillins     Current Outpatient Prescriptions on File Prior to Visit  Medication Sig Dispense Refill  . aspirin 81 MG tablet Take 81 mg by mouth daily.        . bimatoprost (LUMIGAN) 0.01 % SOLN 1 drop as directed.        . Calcium-Vitamin D (CALTRATE 600 PLUS-VIT D PO) Take 2 tablets by mouth daily.        . carvedilol (COREG) 25 MG tablet Take 0.5 tablets (12.5 mg total) by mouth 2 (two) times daily.      . famotidine (PEPCID) 20 MG tablet Take 1 tablet (20 mg total)  by mouth 2 (two) times daily.  60 tablet  6  . losartan (COZAAR) 25 MG tablet Take 0.5 tablets (12.5 mg total) by mouth daily.      Marland Kitchen loteprednol (LOTEMAX) 0.5 % ophthalmic suspension Place 1 drop into both eyes 3 (three) times daily.        . rosuvastatin (CRESTOR) 10 MG tablet Take 10 mg by mouth daily.        Marland Kitchen spironolactone (ALDACTONE) 25 MG tablet Take 1/2 tablet by mouth every other day  30 tablet  4  . nitroGLYCERIN (NITROSTAT) 0.4 MG SL tablet Place 0.4 mg under the tongue every 5 (five) minutes as needed.          Past Medical History  Diagnosis Date  . NICM (nonischemic cardiomyopathy)     echo 1/11: EF 35-40%, mod LVH, Grade 1 diast dysfxn, mild BAE;    cath 2/06: luminal irregs;  Myoview 10/2:  no scar or ischemia, EF 37%   . Pacemaker     pacesetter. 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 Chronic systolic heart failure   . HTN (hypertension)   . HLD (hyperlipidemia)   . Sarcoidosis   . Complete heart block   . Atrial tachycardia   . Diverticulosis   . CKD (chronic kidney disease)     Past Surgical History  Procedure Date  .  Av node ablation   . Status post dual chamber pacer     St. Jude model #5380, Identify    Family History  Problem Relation Age of Onset  . Heart disease Other   . Diabetes Other     History   Social History  . Marital Status: Legally Separated    Spouse Name: N/A    Number of Children: 5  . Years of Education: N/A   Occupational History  . disabled    Social History Main Topics  . Smoking status: Former Smoker    Quit date: 10/06/1988  . Smokeless tobacco: Not on file  . Alcohol Use: No  . Drug Use: No  . Sexually Active: Not on file   Other Topics Concern  . Not on file   Social History Narrative   Retired.     ROS: Negative except for what since the history of present illness  PHYSICAL EXAM: Well-nournished, in no acute distress. Neck:slight increase JVD,no HJR, Bruit, or thyroid  enlargement Lungs: decreased breath sounds throughout but No tachypnea, clear without wheezing, rales, or rhonchi Cardiovascular: RRR, 2/6 systolic murmur at the left sternal border, no gallops, bruit, thrill, or heave. Abdomen: BS normal. Soft without organomegaly, masses, lesions or tenderness. Extremities: without cyanosis, clubbing or edema. Good distal pulses bilateral SKin: Warm, no lesions or rashes  Musculoskeletal: No deformities Neuro: no focal signs  BP 110/80  Pulse 65  Ht 5\' 3"  (1.6 m)  Wt 187 lb (84.823 kg)  BMI 33.13 kg/m2

## 2011-10-29 NOTE — Patient Instructions (Signed)
Your physician recommends that you schedule a follow-up appointment in: 2-3 weeks with our PA Your physician has recommended you make the following change in your medication: TAKE Lasix 20 mg daily for 4 days Your physician recommends that you return for lab work at your next visit (BMP)   2 Gram Low Sodium Diet A 2 gram sodium diet restricts the amount of sodium in the diet to no more than 2 g or 2000 mg daily. Limiting the amount of sodium is often used to help lower blood pressure. It is important if you have heart, liver, or kidney problems. Many foods contain sodium for flavor and sometimes as a preservative. When the amount of sodium in a diet needs to be low, it is important to know what to look for when choosing foods and drinks. The following includes some information and guidelines to help make it easier for you to adapt to a low sodium diet. QUICK TIPS  Do not add salt to food.   Avoid convenience items and fast food.   Choose unsalted snack foods.   Buy lower sodium products, often labeled as "lower sodium" or "no salt added."   Check food labels to learn how much sodium is in 1 serving.   When eating at a restaurant, ask that your food be prepared with less salt or none, if possible.  READING FOOD LABELS FOR SODIUM INFORMATION The nutrition facts label is a good place to find how much sodium is in foods. Look for products with no more than 500 to 600 mg of sodium per meal and no more than 150 mg per serving. Remember that 2 g = 2000 mg. The food label may also list foods as:  Sodium-free: Less than 5 mg in a serving.   Very low sodium: 35 mg or less in a serving.   Low-sodium: 140 mg or less in a serving.   Light in sodium: 50% less sodium in a serving. For example, if a food that usually has 300 mg of sodium is changed to become light in sodium, it will have 150 mg of sodium.   Reduced sodium: 25% less sodium in a serving. For example, if a food that usually has 400 mg  of sodium is changed to reduced sodium, it will have 300 mg of sodium.  CHOOSING FOODS Grains  Avoid: Salted crackers and snack items. Some cereals, including instant hot cereals. Bread stuffing and biscuit mixes. Seasoned rice or pasta mixes.   Choose: Unsalted snack items. Low-sodium cereals, oats, puffed wheat and rice, shredded wheat. English muffins and bread. Pasta.  Meats  Avoid: Salted, canned, smoked, spiced, pickled meats, including fish and poultry. Bacon, ham, sausage, cold cuts, hot dogs, anchovies.   Choose: Low-sodium canned tuna and salmon. Fresh or frozen meat, poultry, and fish.  Dairy  Avoid: Processed cheese and spreads. Cottage cheese. Buttermilk and condensed milk. Regular cheese.   Choose: Milk. Low-sodium cottage cheese. Yogurt. Sour cream. Low-sodium cheese.  Fruits and Vegetables  Avoid: Regular canned vegetables. Regular canned tomato sauce and paste. Frozen vegetables in sauces. Olives. Rosita Fire. Relishes. Sauerkraut.   Choose: Low-sodium canned vegetables. Low-sodium tomato sauce and paste. Frozen or fresh vegetables. Fresh and frozen fruit.  Condiments  Avoid: Canned and packaged gravies. Worcestershire sauce. Tartar sauce. Barbecue sauce. Soy sauce. Steak sauce. Ketchup. Onion, garlic, and table salt. Meat flavorings and tenderizers.   Choose: Fresh and dried herbs and spices. Low-sodium varieties of mustard and ketchup. Lemon juice. Tabasco sauce. Horseradish.  SAMPLE 2 GRAM SODIUM MEAL PLAN Breakfast / Sodium (mg)  1 cup low-fat milk / 143 mg   2 slices whole-wheat toast / 270 mg   1 tbs heart-healthy margarine / 153 mg   1 hard-boiled egg / 139 mg   1 small orange / 0 mg  Lunch / Sodium (mg)  1 cup raw carrots / 76 mg    cup hummus / 298 mg   1 cup low-fat milk / 143 mg    cup red grapes / 2 mg   1 whole-wheat pita bread / 356 mg  Dinner / Sodium (mg)  1 cup whole-wheat pasta / 2 mg   1 cup low-sodium tomato sauce / 73 mg   3  oz lean ground beef / 57 mg   1 small side salad (1 cup raw spinach leaves,  cup cucumber,  cup yellow bell pepper) with 1 tsp olive oil and 1 tsp red wine vinegar / 25 mg  Snack / Sodium (mg)  1 container low-fat vanilla yogurt / 107 mg   3 graham cracker squares / 127 mg  Nutrient Analysis  Calories: 2033   Protein: 77 g   Carbohydrate: 282 g   Fat: 72 g   Sodium: 1971 mg  Document Released: 09/22/2005 Document Revised: 06/04/2011 Document Reviewed: 12/24/2009 [facility] Patient Information 2012 [place], [place].

## 2011-10-29 NOTE — Assessment & Plan Note (Signed)
Patient still has dyspnea on exertion with walking one block. She has lost 4 pounds but still has fluid on board. She is getting excess salt in her diet. I will give her a 2 g sodium diet to follow. We will give her Lasix 20 mg for 4 days. She will followup in 2-3 weeks.

## 2011-10-29 NOTE — Assessment & Plan Note (Signed)
Patient's creatinine was 1.8 last office visit. She is for repeat blood work today. We have to watch her closely on the Lasix and Aldactone.

## 2011-11-05 NOTE — Progress Notes (Signed)
Left message for pt to call back  °

## 2011-11-11 ENCOUNTER — Encounter (HOSPITAL_COMMUNITY): Payer: Self-pay | Admitting: *Deleted

## 2011-11-11 ENCOUNTER — Other Ambulatory Visit (HOSPITAL_COMMUNITY): Payer: PRIVATE HEALTH INSURANCE

## 2011-11-11 ENCOUNTER — Emergency Department (HOSPITAL_COMMUNITY)
Admission: EM | Admit: 2011-11-11 | Discharge: 2011-11-11 | Disposition: A | Discharge status: Home / Self Care | Payer: PRIVATE HEALTH INSURANCE | Attending: Emergency Medicine | Admitting: Emergency Medicine

## 2011-11-11 ENCOUNTER — Other Ambulatory Visit: Payer: Self-pay

## 2011-11-11 ENCOUNTER — Emergency Department (HOSPITAL_COMMUNITY): Payer: PRIVATE HEALTH INSURANCE

## 2011-11-11 DIAGNOSIS — I509 Heart failure, unspecified: Secondary | ICD-10-CM | POA: Insufficient documentation

## 2011-11-11 DIAGNOSIS — I129 Hypertensive chronic kidney disease with stage 1 through stage 4 chronic kidney disease, or unspecified chronic kidney disease: Secondary | ICD-10-CM | POA: Insufficient documentation

## 2011-11-11 DIAGNOSIS — M549 Dorsalgia, unspecified: Secondary | ICD-10-CM

## 2011-11-11 DIAGNOSIS — D869 Sarcoidosis, unspecified: Secondary | ICD-10-CM | POA: Insufficient documentation

## 2011-11-11 DIAGNOSIS — E785 Hyperlipidemia, unspecified: Secondary | ICD-10-CM | POA: Insufficient documentation

## 2011-11-11 DIAGNOSIS — I5022 Chronic systolic (congestive) heart failure: Secondary | ICD-10-CM | POA: Insufficient documentation

## 2011-11-11 DIAGNOSIS — Z950 Presence of cardiac pacemaker: Secondary | ICD-10-CM | POA: Insufficient documentation

## 2011-11-11 DIAGNOSIS — R079 Chest pain, unspecified: Secondary | ICD-10-CM | POA: Insufficient documentation

## 2011-11-11 DIAGNOSIS — N289 Disorder of kidney and ureter, unspecified: Secondary | ICD-10-CM

## 2011-11-11 DIAGNOSIS — N189 Chronic kidney disease, unspecified: Secondary | ICD-10-CM | POA: Insufficient documentation

## 2011-11-11 LAB — BASIC METABOLIC PANEL
BUN: 41 mg/dL — ABNORMAL HIGH (ref 6–23)
CO2: 23 mEq/L (ref 19–32)
Chloride: 107 mEq/L (ref 96–112)
Creatinine, Ser: 2.3 mg/dL — ABNORMAL HIGH (ref 0.50–1.10)
Potassium: 4.5 mEq/L (ref 3.5–5.1)

## 2011-11-11 LAB — PRO B NATRIURETIC PEPTIDE: Pro B Natriuretic peptide (BNP): 324.3 pg/mL — ABNORMAL HIGH (ref 0–125)

## 2011-11-11 LAB — CBC
HCT: 33.1 % — ABNORMAL LOW (ref 36.0–46.0)
Hemoglobin: 11 g/dL — ABNORMAL LOW (ref 12.0–15.0)
MCHC: 33.2 g/dL (ref 30.0–36.0)
RBC: 3.71 MIL/uL — ABNORMAL LOW (ref 3.87–5.11)

## 2011-11-11 LAB — TROPONIN I: Troponin I: <0.3 ng/mL (ref <0.30)

## 2011-11-11 MED ORDER — MORPHINE SULFATE 4 MG/ML IJ SOLN
4.0000 mg | Freq: Once | INTRAMUSCULAR | Status: AC
  Administered 2011-11-11: 4 mg via INTRAVENOUS
  Filled 2011-11-11: qty 1

## 2011-11-11 MED ORDER — HYDROCODONE-ACETAMINOPHEN 5-325 MG PO TABS: 2.0000 | ORAL_TABLET | ORAL | Status: AC | PRN

## 2011-11-11 NOTE — Consult Note (Signed)
Consult Note  Patient ID: [name] MRN: [id number], SOB: August 20, 1946 66 y.o. Date of Encounter: 11/11/2011, 2:00 PM  Primary Physician: Allean Found, MD Primary Cardiologist: Berton Mount MD  Chief Complaint: Back/Chest pain  HPI: 66 y.o. female w/ PMHx significant for chronic systolic CHF (EF 29-52%), nonischemic CM 2/2 cardiac sarcoid (s/p St. Jude PPM '98 for CHB), HTN, HLD, and CKD who presented to [facility] on 11/11/2011 with complaints of back/chest pain.  Last cardiac catheterization in 2006 demonstrated no obstructive CAD. Last echocardiogram in 1/11 revealed moderate LVH, EF 35-40% with grade 1 diastolic dysfunction and mild bi-atrial enlargement. Last Myoview on 07/08/11 showed no scar or ischemia with EF 37%. Was last seen in clinic on 10/29/11 by Jacolyn Reedy [facility] for dyspnea on exertion related to fluid excess thought to be due to dietary indiscretion. She was prescribed Lasix 20mg  for 4 days and educated on low sodium diet with plans for follow up in 2-3 weeks.  She reports her breathing has improved since completing the Lasix one week ago, but still has some baseline DOE when walking to her classes. She reports experiencing 9/10 back pain about five days ago that she describes as a heat-like burning pain that was associated with sharp, shooting pains through to her chest. She doesn't remember what she was doing when it started. It waxes and weans and is worsened with inspiration and movement. She tried ibuprofen once with no relief. She carries a heavy book bag to class and denies any chest pain with activity. She doesn't weigh herself at home, but thinks her weight is stable. She denies lower extremity edema and thinks her abd distension has improved since taking Lasix. Denies palpitations, nausea, diaphoresis, dizziness, recent illness, fevers, chills, decreased urination, or change in bowels. She has an appointment scheduled with nephrology on 11/24/11 for  evaluation of her worsening creatinine.  In the ED, EKG shows an AV paced rhythm 72bpm, unchanged from previous EKG. CXR reveals cardiomegaly without heart failure. Initial troponin is negative. Labs are significant for Crt 2.3 (1.8 previously) and proBNP 324. Received 4mg  IV morphine with relief of her back/chest pain.   Past Medical History  Diagnosis Date  . NICM (nonischemic cardiomyopathy)     echo 1/11: EF 35-40%, mod LVH, Grade 1 diast dysfxn, mild BAE;    cath 2/06: luminal irregs;  Myoview 10/2:  no scar or ischemia, EF 37%   . Pacemaker     pacesetter. 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 Chronic systolic heart failure   . HTN (hypertension)   . HLD (hyperlipidemia)   . Sarcoidosis   . Complete heart block   . Atrial tachycardia   . Diverticulosis   . CKD (chronic kidney disease)     10/09/09 - 2D Echocardiogram  Study Conclusions:    - Left ventricle: The cavity size was normal. There was moderate concentric hypertrophy. Systolic function was moderately reduced. The estimated ejection fraction was in the range of 35% to 40%. Diffuse hypokinesis. Doppler parameters are consistent with abnormal left ventricular relaxation (grade 1 diastolic dysfunction).   - Left atrium: The atrium was mildly dilated.   - Right atrium: The atrium was mildly dilated.  12/03/04 - Left heart catheterization with coronary angiography, left ventriculography LEFT VENTRICULOGRAM:  There is moderate global hypokinesis, particularly of the anterolateral [place].  The ejection fraction is estimated at 40%. There is no mitral regurgitation.  1.  LEFT MAIN CORONARY ARTERY:  The left main  coronary artery is normal.  2.  LEFT ANTERIOR DESCENDING CORONARY ARTERY:  The left anterior descending artery gives rise to a single normal-sized diagonal branch.  The LAD has minor luminal  irregularities.  3.  LEFT CIRCUMFLEX CORONARY ARTERY:  The left circumflex gives rise to a small ramus intermedius, a  small first obtuse marginal and a large branching second obtuse marginal. The left circumflex is normal.  4.  RIGHT CORONARY ARTERY:  The right coronary has minor luminal irregularities in the proximal vessel. This vessel is otherwise normal, giving rise to a large posterior descending artery and three small posterolateral branches.  IMPRESSION: 1.  Moderately-decreased left ventricular systolic function with global hypokinesis of the left ventricle.   2.  No significant coronary artery disease identified.  Surgical History:  Past Surgical History  Procedure Date  . Av node ablation   . Status post dual chamber pacer     St. Jude model #5380, Identify    Home Meds: Medication Sig  aspirin 81 MG tablet Take 81 mg by mouth daily.    bimatoprost (LUMIGAN) 0.01 % SOLN Place 1 drop into the right eye at bedtime.   Calcium-Vitamin D (CALTRATE 600 PLUS-VIT D PO) Take 2 tablets by mouth daily.    carvedilol (COREG) 25 MG tablet Take 0.5 tablets (12.5 mg total) by mouth 2 (two) times daily.  famotidine (PEPCID) 20 MG tablet Take 1 tablet (20 mg total) by mouth 2 (two) times daily.  losartan (COZAAR) 25 MG tablet Take 0.5 tablets (12.5 mg total) by mouth daily.  loteprednol (LOTEMAX) 0.5 % ophthalmic suspension Place 1 drop into both eyes 3 (three) times daily.    rosuvastatin (CRESTOR) 10 MG tablet Take 10 mg by mouth daily.    spironolactone (ALDACTONE) 25 MG tablet Take 1/2 tablet by mouth every other day  nitroGLYCERIN (NITROSTAT) 0.4 MG SL tablet Place 0.4 mg under the tongue every 5 (five) minutes as needed.     Allergies:  Allergen Reactions  . Codeine Itching  . Penicillins Itching    History   Social History  . Marital Status: Legally Separated    Number of Children: 5   Occupational History  . disabled    Social History Main Topics  . Smoking status: Former Smoker    Quit date: 10/06/1988  . Alcohol Use: No  . Drug Use: No   Family History  Problem Relation Age of Onset   . Heart disease Other   . Diabetes Other     Review of Systems: General: negative for chills, fever, night sweats or weight changes.  Cardiovascular: (+) chest pain, dyspnea on exertion; negative for edema, orthopnea, palpitations, paroxysmal nocturnal dyspnea  Dermatological: negative for rash Respiratory: negative for cough or wheezing Urologic: negative for hematuria Abdominal: negative for nausea, vomiting, diarrhea, bright red blood per rectum, melena, or hematemesis Neurologic: negative for visual changes, syncope, or dizziness Musculoskeletal: (+) back pain All other systems reviewed and are otherwise negative except as noted above.  Labs:   Component Value Date   WBC 4.7 11/11/2011   HGB 11.0* 11/11/2011   HCT 33.1* 11/11/2011   MCV 89.2 11/11/2011   PLT 212 11/11/2011    Lab 11/11/11 1135  NA 139  K 4.5  CL 107  CO2 23  BUN 41*  CREATININE 2.30*  CALCIUM 10.1  GLUCOSE 86   Basename 11/11/11 1135  TROPONIN I <0.30     11/11/2011 11:32  Pro B Natriuretic peptide (BNP) 324.3 (H)  Radiology/Studies:  Dg Chest 2 View 11/11/2011   Findings:  There is a left chest Idrees Quam pacer device with lead in the right atrial appendage and right ventricle.  The heart size appears moderately enlarged.  No airspace consolidation identified.  Review of the visualized osseous structures is unremarkable.  IMPRESSION:  1.  Cardiac enlargement. 2.  No heart failure.      EKG: 11/11/11 @ 1116 - AV paced rhythm 72bpm, unchanged from previous EKG  Physical Exam: Blood pressure 134/71, pulse 66, temperature 98.3 F (36.8 C), temperature source Oral, resp. rate 18, SpO2 98.00%. General: Overweight black female in no acute distress. Head: Normocephalic, atraumatic, sclera non-icteric, nares are without discharge Neck: Supple. Negative for carotid bruits. JVD not elevated. Lungs: Clear bilaterally to auscultation without wheezes, rales, or rhonchi. Breathing is unlabored. Heart: RRR with S1 S2. No  murmurs, rubs, or gallops appreciated. Abdomen: Soft, non-tender, non-distended with normoactive bowel sounds. No rebound/guarding. No obvious abdominal masses. Msk:  Strength and tone appear normal for age. Extremities: No edema. No clubbing or cyanosis. Distal pedal pulses are 2+ and equal bilaterally. Neuro: Alert and oriented X 3. Moves all extremities spontaneously. Psych:  Responds to questions appropriately with a normal affect.    ASSESSMENT AND PLAN:   66 y.o. female w/ PMHx significant for chronic systolic CHF (EF 16-10%), nonischemic CM 2/2 cardiac sarcoid (s/p St. Jude PPM '98 for CHB), HTN, HLD, and CKD who presented to [facility] on 11/11/2011 with complaints of back/chest pain.  1. Back/Chest pain: She has had back/chest pain for 5 days and presents with negative initial cardiac enzymes and no acute EKG changes. CXR without acute cardiopulmonary findings. Currently pain free after morphine administration. Symptoms are atypical for ACS and likely musculoskeletal in nature. Would avoid NSAIDs in light of renal function. Recommend heat, stretching, and tylenol for further pain.  2. Acute on Chronic Renal Insufficiency: Baseline creatinine 1.6-1.8. Was recently placed on 8 day course of Lasix which she completed one week ago. Crt 2.3 today. Has not had any decrease in urination.  Recommend continuing with scheduled nephrology appt on 11/24/11.  3. Chronic systolic CHF 2/2 nonischemic CM: Mildly elevated proBNP. Euvolemic on exam. NO heart failure on CXR.   Signed, HOPE, JESSICA PA-C 11/11/2011, 2:00 PM  I have taken a history, reviewed medications, allergies, PMH, SH, FH, and reviewed ROS and examined the patient.  I agree with the assessment and plan. Discussed with Dr Hyman Hopes and patient. Cardiac follow up with Dr Graciela Husbands as already scheduled.  Aurorah Schlachter C. Daleen Squibb, MD, [facility] Pager:  [phone]

## 2011-11-11 NOTE — ED Notes (Signed)
Pt reports sharp chest pain for the last week that originates in her left back and radiates to her left chest. States she was hoping the pain would go away which is why she didn't come in sooner. Pt tearful during exam.

## 2011-11-11 NOTE — H&P (Addendum)
Opened in error

## 2011-11-11 NOTE — ED Notes (Signed)
Pt is here with sharp pain that starts in mid back and comes through to chest.  No sob.  Pt is CHF patient.

## 2011-11-11 NOTE — ED Provider Notes (Addendum)
History     CSN: [id number]  Arrival date & time 11/11/11  1110   First MD Initiated Contact with Patient 11/11/11 1145      Chief Complaint  Patient presents with  . Chest Pain  . Back Pain    (Consider location/radiation/quality/duration/timing/severity/associated sxs/prior treatment) HPI  65yoF h/o NICM EF 35-40%, sarcoidosis, hypertension, hyperlipidemia, history of complete heart block post pacemaker, chronic renal insufficiency presents with back pain, chest pain. The patient states that her 6 days ago she began to experience pain in her upper back. She states that 3 days ago began to radiate to her anterior chest. She denies shortness of breath. She describes it as very sharp. Pain is currently a 9/10. She denies nausea, vomiting, diaphoresis. She states that this is not like her GERD. She denies any trauma. The pain is not worse with deep breathing or with movement. There has been no fall.    ED Notes, ED Provider Notes from 11/11/11 0000 to 11/11/11 11:20:04       Galvin Proffer, RN 11/11/2011 11:18      Pt is here with sharp pain that starts in mid back and comes through to chest. No sob. Pt is CHF patient.     Past Medical History  Diagnosis Date  . NICM (nonischemic cardiomyopathy)     echo 1/11: EF 35-40%, mod LVH, Grade 1 diast dysfxn, mild BAE;    cath 2/06: luminal irregs;  Myoview 10/2:  no scar or ischemia, EF 37%   . Pacemaker     pacesetter. 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 Chronic systolic heart failure   . HTN (hypertension)   . HLD (hyperlipidemia)   . Sarcoidosis   . Complete heart block   . Atrial tachycardia   . Diverticulosis   . CKD (chronic kidney disease)     Past Surgical History  Procedure Date  . Av node ablation   . Status post dual chamber pacer     St. Jude model #5380, Identify    Family History  Problem Relation Age of Onset  . Heart disease Other   . Diabetes Other     History  Substance Use  Topics  . Smoking status: Former Smoker    Quit date: 10/06/1988  . Smokeless tobacco: Not on file  . Alcohol Use: No    OB History    Grav Para Term Preterm Abortions TAB SAB Ect Mult Living                  Review of Systems  All other systems reviewed and are negative.  except as noted HPI   Allergies  Codeine and Penicillins  Home Medications   Current Outpatient Rx  Name Route Sig Dispense Refill  . ASPIRIN 81 MG PO TABS Oral Take 81 mg by mouth daily.      Marland Kitchen BIMATOPROST 0.01 % OP SOLN Right Eye Place 1 drop into the right eye at bedtime.     Marland Kitchen CALTRATE 600 PLUS-VIT D PO Oral Take 2 tablets by mouth daily.      Marland Kitchen CARVEDILOL 25 MG PO TABS Oral Take 0.5 tablets (12.5 mg total) by mouth 2 (two) times daily.    Marland Kitchen FAMOTIDINE 20 MG PO TABS Oral Take 1 tablet (20 mg total) by mouth 2 (two) times daily. 60 tablet 6  . LOSARTAN POTASSIUM 25 MG PO TABS Oral Take 0.5 tablets (12.5 mg total) by mouth daily.    Marland Kitchen LOTEPREDNOL  ETABONATE 0.5 % OP SUSP Both Eyes Place 1 drop into both eyes 3 (three) times daily.      Marland Kitchen ROSUVASTATIN CALCIUM 10 MG PO TABS Oral Take 10 mg by mouth daily.      Marland Kitchen SPIRONOLACTONE 25 MG PO TABS  Take 1/2 tablet by mouth every other day 30 tablet 4  . HYDROCODONE-ACETAMINOPHEN 5-325 MG PO TABS Oral Take 2 tablets by mouth every 4 (four) hours as needed for pain. 10 tablet 0  . NITROGLYCERIN 0.4 MG SL SUBL Sublingual Place 0.4 mg under the tongue every 5 (five) minutes as needed.        BP 129/74  Pulse 66  Temp(Src) 98.3 F (36.8 C) (Oral)  Resp 18  SpO2 99%  Physical Exam  Nursing note and vitals reviewed. Constitutional: She is oriented to person, place, and time. She appears well-developed.       Crying, appears to be in pain  HENT:  Head: Atraumatic.  Mouth/Throat: Oropharynx is clear and moist.  Eyes: Conjunctivae and EOM are normal. Pupils are equal, round, and reactive to light.  Neck: Normal range of motion. Neck supple.  Cardiovascular:  Normal rate, regular rhythm, normal heart sounds and intact distal pulses.   Pulmonary/Chest: Effort normal and breath sounds normal. No respiratory distress. She has no wheezes. She has no rales.  Abdominal: Soft. She exhibits no distension. There is no tenderness. There is no rebound and no guarding.  Musculoskeletal: Normal range of motion.  Neurological: She is alert and oriented to person, place, and time.  Skin: Skin is warm and dry. No rash noted.  Psychiatric: She has a normal mood and affect.    Date: 11/11/2011  Rate: 72  Rhythm: AV dual pacemaker  Old EKG Reviewed: unchanged  ED Course  Procedures (including critical care time)  Labs Reviewed  BASIC METABOLIC PANEL - Abnormal; Notable for the following:    BUN 41 (*)    Creatinine, Ser 2.30 (*)    GFR calc non Af Amer 21 (*)    GFR calc Af Amer 25 (*)    All other components within normal limits  PRO B NATRIURETIC PEPTIDE - Abnormal; Notable for the following:    Pro B Natriuretic peptide (BNP) 324.3 (*)    All other components within normal limits  CBC - Abnormal; Notable for the following:    RBC 3.71 (*)    Hemoglobin 11.0 (*)    HCT 33.1 (*)    All other components within normal limits  TROPONIN I   Dg Chest 2 View  11/11/2011  *RADIOLOGY REPORT*  Clinical Data: Chest and back pain  CHEST - 2 VIEW  Comparison: 05/23/2011  Findings:  There is a left chest wall pacer device with lead in the right atrial appendage and right ventricle.  The heart size appears moderately enlarged.  No airspace consolidation identified.  Review of the visualized osseous structures is unremarkable.  IMPRESSION:  1.  Cardiac enlargement. 2.  No heart failure.  Original Report Authenticated By: Rosealee Albee, M.D.     1. Chest pain   2. Back pain   3. Renal insufficiency      MDM  Patient presents for chest pain, back pain without shortness of breath. Differential diagnosis is broad including acute coronary syndrome, aortic  dissection, pneumonia, pneumothorax, musculoskeletal. Vitals signs are stable. Her EKG is unchanged from previous. She has renal insufficiency and CT angiogram her chest cannot be obtained. MRI initially ordered to evaluate for  dissection, but upon further consideration it was canceled. The patient has been having ongoing chest pain for a few days initiated dissection she likely would've decompensated by now. Cardiology has been consult and has evaluated the patient emergency department. I discussed the patient's case with Dr. Ane Payment is not believe this pain is cardiac in nature, rather msk. Patient pain free at this time after morphine. Plan for discharge home with pain control, PMD and cardiology follow up. Patient comfortable with the plan and has f/u with her PMD in 3 days. Aware that she will need recheck of her creatinine.        Forbes Cellar, MD 11/11/11 1615  Forbes Cellar, MD 11/11/11 [phone]

## 2011-11-12 ENCOUNTER — Other Ambulatory Visit: Payer: PRIVATE HEALTH INSURANCE | Admitting: *Deleted

## 2011-11-14 ENCOUNTER — Ambulatory Visit (INDEPENDENT_AMBULATORY_CARE_PROVIDER_SITE_OTHER): Payer: PRIVATE HEALTH INSURANCE | Admitting: Physician Assistant

## 2011-11-14 ENCOUNTER — Encounter: Payer: Self-pay | Admitting: Physician Assistant

## 2011-11-14 ENCOUNTER — Other Ambulatory Visit (INDEPENDENT_AMBULATORY_CARE_PROVIDER_SITE_OTHER): Payer: PRIVATE HEALTH INSURANCE | Admitting: *Deleted

## 2011-11-14 VITALS — BP 121/71 | HR 65 | Ht 63.0 in | Wt 187.0 lb

## 2011-11-14 DIAGNOSIS — I5022 Chronic systolic (congestive) heart failure: Secondary | ICD-10-CM

## 2011-11-14 DIAGNOSIS — I442 Atrioventricular block, complete: Secondary | ICD-10-CM

## 2011-11-14 DIAGNOSIS — I471 Supraventricular tachycardia: Secondary | ICD-10-CM

## 2011-11-14 DIAGNOSIS — I498 Other specified cardiac arrhythmias: Secondary | ICD-10-CM

## 2011-11-14 DIAGNOSIS — Z950 Presence of cardiac pacemaker: Secondary | ICD-10-CM

## 2011-11-14 LAB — BASIC METABOLIC PANEL
BUN: 27 mg/dL — ABNORMAL HIGH (ref 6–23)
CO2: 28 mEq/L (ref 19–32)
Chloride: 105 mEq/L (ref 96–112)
GFR: 40.07 mL/min — ABNORMAL LOW (ref >60.00)
Glucose, Bld: 82 mg/dL (ref 70–99)
Potassium: 4.6 mEq/L (ref 3.5–5.1)

## 2011-11-14 MED ORDER — FUROSEMIDE 20 MG PO TABS: 20.0000 mg | ORAL_TABLET | Freq: Every day | ORAL | Status: DC | PRN

## 2011-11-14 NOTE — Assessment & Plan Note (Signed)
Recent trip to the ER.  This was felt to be a muscle strain in her back.  She had a recent low risk Myoview.  She has a history of nonobstructive coronary disease on cardiac catheterization.  No further workup was planned when seen in ED.  She is currently not complaining of chest discomfort.

## 2011-11-14 NOTE — Patient Instructions (Addendum)
Please have blood work done today: BMET; Dx: [phone]  Your physician recommends that you weigh, daily, at the same time every day, and in the same amount of clothing. Please record your daily weights on the handout provided and bring it to your next appointment.   Take Lasix 20 mg once daily as needed if weight increases by 2 lbs or more, or if you notice increased SOB or Edema (swelling)    Your physician recommends that you schedule a follow-up appointment in: 3 months with Dr. Logan Bores have been referred to Dr. Arvilla Meres at the CHF clinic at [facility].    You have an upcoming appointment with Nephrology, please keep this appointment on Feb. 18, 2013

## 2011-11-14 NOTE — Assessment & Plan Note (Signed)
Check bmet today.  Nephrology appt pending 2/18 with Dr. Hyman Hopes.

## 2011-11-14 NOTE — Progress Notes (Signed)
[address] [place], [place]  [address] Phone: [phone] Fax:  [phone]  Date:  11/14/2011   Name:  [name]       DOB:  06/06/1946 MRN:  [id number]  PCP:  Dr. Katrinka Blazing Primary Cardiologist:   Primary Electrophysiologist:  Dr. Sherryl Manges    History of Present Illness: [name] is a 66 y.o. female who presents for follow up.  She has a history of nonischemic cardiomyopathy secondary cardiac sarcoid, status post pacemaker implantation for complete heart block. Left heart catheterization 2/06: luminal irregularities, no obstructive CAD. Last echocardiogram 1/11: Moderate LVH, EF 35-40%, grade 1 diastolic dysfunction, mild biatrial enlargement.  Admitted in 8/12 with near-syncope and chest heaviness. MI ruled out. Pacer interrogation demonstrated 623 atrial episodes with longest duration 7 minutes. Total atrial tachycardia/atrial fibrillation burden was less than 1% with the majority of atrial tachycardias lasting 10-40 seconds in duration. No changes were made in the settings of her device.  Myoview 10/2: no scar or ischemia, EF 37% (which was consistent with prior EF on echocardiogram).  Saw Dr. Sherryl Manges recently with volume overload and followed up with Jacolyn Reedy 2 weeks ago.  She was still felt to be volume overloaded at that time.  Lasix was continued for several more days.  She notes improvement in her breathing.  She describes class 2-2b symptoms.  No orthopnea or PND.  No edema.  No chest pain.  She did go to the emergency room several nights ago with back pain.  She was diagnosed with a muscle strain.  She was seen by Dr. Daleen Squibb.  No further cardiac workup was recommended.  She has a followup with nephrology pending.  Her creatinine was elevated at 2.3.  Past Medical History  Diagnosis Date  . NICM (nonischemic cardiomyopathy)     echo 1/11: EF 35-40%, mod LVH, Grade 1 diast dysfxn, mild BAE;    cath 2/06: luminal irregs;  Myoview 10/2:   no scar or ischemia, EF 37%   . Pacemaker     pacesetter. 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 Chronic systolic heart failure   . HTN (hypertension)   . HLD (hyperlipidemia)   . Sarcoidosis   . Complete heart block   . Atrial tachycardia   . Diverticulosis   . CKD (chronic kidney disease)     Current Outpatient Prescriptions  Medication Sig Dispense Refill  . aspirin 81 MG tablet Take 81 mg by mouth daily.        . bimatoprost (LUMIGAN) 0.01 % SOLN Place 1 drop into the right eye at bedtime.       . Calcium-Vitamin D (CALTRATE 600 PLUS-VIT D PO) Take 2 tablets by mouth daily.        . carvedilol (COREG) 25 MG tablet Take 0.5 tablets (12.5 mg total) by mouth 2 (two) times daily.      . famotidine (PEPCID) 20 MG tablet Take 1 tablet (20 mg total) by mouth 2 (two) times daily.  60 tablet  6  . HYDROcodone-acetaminophen (NORCO) 5-325 MG per tablet Take 2 tablets by mouth every 4 (four) hours as needed for pain.  10 tablet  0  . losartan (COZAAR) 25 MG tablet Take 0.5 tablets (12.5 mg total) by mouth daily.      Marland Kitchen loteprednol (LOTEMAX) 0.5 % ophthalmic suspension Place 1 drop into both eyes 3 (three) times daily.        . nitroGLYCERIN (NITROSTAT) 0.4 MG  SL tablet Place 0.4 mg under the tongue every 5 (five) minutes as needed.        . rosuvastatin (CRESTOR) 10 MG tablet Take 10 mg by mouth daily.        Marland Kitchen spironolactone (ALDACTONE) 25 MG tablet Take 1/2 tablet by mouth every other day  30 tablet  4    Allergies: Allergies  Allergen Reactions  . Codeine Itching  . Penicillins Itching    History  Substance Use Topics  . Smoking status: Former Smoker    Quit date: 10/06/1988  . Smokeless tobacco: Not on file  . Alcohol Use: No     PHYSICAL EXAM: VS:  BP 121/71  Pulse 65  Ht 5\' 3"  (1.6 m)  Wt 187 lb (84.823 kg)  BMI 33.13 kg/m2 Well nourished, well developed, in no acute distress HEENT: normal Neck: no JVD Cardiac:  normal S1, S2; RRR; no  murmur Lungs:  clear to auscultation bilaterally, no wheezing, rhonchi or rales Abd: soft, nontender, no hepatomegaly Ext: no edema Skin: warm and dry Neuro:  CNs 2-12 intact, no focal abnormalities noted  EKG:  AV paced, heart rate 68  ASSESSMENT AND PLAN:

## 2011-11-14 NOTE — Assessment & Plan Note (Signed)
Volume is improved.  She did have an elevated creatinine in the hospital several days ago.  We will recheck a basic metabolic panel today.  Upon the recommendation of Dr. Graciela Husbands, I will set her up for the congestive heart failure clinic.  She can see Dr. Graciela Husbands back in 3 months.  She is advised to weigh herself daily.  If her weight goes up 2-3 pounds in one day she knows to take Lasix on that day.

## 2011-11-24 ENCOUNTER — Encounter: Payer: Self-pay | Admitting: Physician Assistant

## 2011-11-25 ENCOUNTER — Other Ambulatory Visit: Payer: Self-pay | Admitting: Pharmacy Technician

## 2011-11-25 ENCOUNTER — Other Ambulatory Visit: Payer: Self-pay | Admitting: Nephrology

## 2011-11-26 ENCOUNTER — Other Ambulatory Visit: Payer: PRIVATE HEALTH INSURANCE

## 2011-11-27 ENCOUNTER — Other Ambulatory Visit: Payer: PRIVATE HEALTH INSURANCE

## 2011-11-28 ENCOUNTER — Other Ambulatory Visit: Payer: Self-pay | Admitting: Radiology

## 2011-11-28 ENCOUNTER — Encounter (HOSPITAL_COMMUNITY): Payer: Self-pay | Admitting: Pharmacy Technician

## 2011-11-28 ENCOUNTER — Other Ambulatory Visit (HOSPITAL_COMMUNITY): Payer: Self-pay | Admitting: Nephrology

## 2011-11-28 DIAGNOSIS — D869 Sarcoidosis, unspecified: Secondary | ICD-10-CM

## 2011-12-01 ENCOUNTER — Encounter (HOSPITAL_COMMUNITY): Payer: Self-pay

## 2011-12-01 ENCOUNTER — Observation Stay (HOSPITAL_COMMUNITY)
Admission: RE | Admit: 2011-12-01 | Discharge: 2011-12-02 | Disposition: A | Discharge status: Home / Self Care | Payer: PRIVATE HEALTH INSURANCE | Source: Ambulatory Visit | Attending: Interventional Radiology | Admitting: Interventional Radiology

## 2011-12-01 DIAGNOSIS — I428 Other cardiomyopathies: Secondary | ICD-10-CM | POA: Insufficient documentation

## 2011-12-01 DIAGNOSIS — I5022 Chronic systolic (congestive) heart failure: Secondary | ICD-10-CM | POA: Insufficient documentation

## 2011-12-01 DIAGNOSIS — D869 Sarcoidosis, unspecified: Secondary | ICD-10-CM | POA: Insufficient documentation

## 2011-12-01 DIAGNOSIS — I495 Sick sinus syndrome: Secondary | ICD-10-CM

## 2011-12-01 DIAGNOSIS — N189 Chronic kidney disease, unspecified: Secondary | ICD-10-CM | POA: Insufficient documentation

## 2011-12-01 DIAGNOSIS — I442 Atrioventricular block, complete: Secondary | ICD-10-CM

## 2011-12-01 DIAGNOSIS — I471 Supraventricular tachycardia: Secondary | ICD-10-CM

## 2011-12-01 DIAGNOSIS — Z950 Presence of cardiac pacemaker: Secondary | ICD-10-CM

## 2011-12-01 DIAGNOSIS — I509 Heart failure, unspecified: Secondary | ICD-10-CM | POA: Insufficient documentation

## 2011-12-01 DIAGNOSIS — E785 Hyperlipidemia, unspecified: Secondary | ICD-10-CM | POA: Insufficient documentation

## 2011-12-01 DIAGNOSIS — I129 Hypertensive chronic kidney disease with stage 1 through stage 4 chronic kidney disease, or unspecified chronic kidney disease: Principal | ICD-10-CM | POA: Insufficient documentation

## 2011-12-01 LAB — CBC
HCT: 31.8 % — ABNORMAL LOW (ref 36.0–46.0)
Platelets: 238 10*3/uL (ref 150–400)
Platelets: 259 10*3/uL (ref 150–400)
RDW: 13.4 % (ref 11.5–15.5)
RDW: 13.4 % (ref 11.5–15.5)
WBC: 4.6 10*3/uL (ref 4.0–10.5)
WBC: 8 10*3/uL (ref 4.0–10.5)

## 2011-12-01 LAB — PROTIME-INR
INR: 1.05 (ref 0.00–1.49)
Prothrombin Time: 13.9 seconds (ref 11.6–15.2)

## 2011-12-01 LAB — APTT: aPTT: 29 seconds (ref 24–37)

## 2011-12-01 MED ORDER — ASPIRIN 81 MG PO CHEW
81.0000 mg | CHEWABLE_TABLET | Freq: Every day | ORAL | Status: DC
  Administered 2011-12-01: 81 mg via ORAL
  Filled 2011-12-01: qty 1

## 2011-12-01 MED ORDER — CARVEDILOL 12.5 MG PO TABS
12.5000 mg | ORAL_TABLET | Freq: Two times a day (BID) | ORAL | Status: DC
  Administered 2011-12-01 – 2011-12-02 (×3): 12.5 mg via ORAL
  Filled 2011-12-01 (×4): qty 1

## 2011-12-01 MED ORDER — FUROSEMIDE 20 MG PO TABS
20.0000 mg | ORAL_TABLET | Freq: Every day | ORAL | Status: DC | PRN
  Filled 2011-12-01: qty 1

## 2011-12-01 MED ORDER — FENTANYL CITRATE 0.05 MG/ML IJ SOLN
INTRAMUSCULAR | Status: AC
  Filled 2011-12-01: qty 4

## 2011-12-01 MED ORDER — ONDANSETRON HCL 4 MG/2ML IJ SOLN
INTRAMUSCULAR | Status: AC
  Filled 2011-12-01: qty 2

## 2011-12-01 MED ORDER — MORPHINE SULFATE 2 MG/ML IJ SOLN
2.0000 mg | INTRAMUSCULAR | Status: DC | PRN
Start: 2011-12-01 — End: 2011-12-02
  Administered 2011-12-02: 2 mg via INTRAVENOUS
  Filled 2011-12-01: qty 1

## 2011-12-01 MED ORDER — FENTANYL CITRATE 0.05 MG/ML IJ SOLN
INTRAMUSCULAR | Status: AC | PRN
  Administered 2011-12-01: 50 ug via INTRAVENOUS

## 2011-12-01 MED ORDER — SODIUM CHLORIDE 0.9 % IV SOLN
Freq: Once | INTRAVENOUS | Status: AC
  Administered 2011-12-01: 08:00:00 via INTRAVENOUS

## 2011-12-01 MED ORDER — LOSARTAN POTASSIUM 25 MG PO TABS
12.5000 mg | ORAL_TABLET | Freq: Every day | ORAL | Status: DC
  Administered 2011-12-01 – 2011-12-02 (×2): 12.5 mg via ORAL
  Filled 2011-12-01 (×2): qty 0.5

## 2011-12-01 MED ORDER — HYDROCODONE-ACETAMINOPHEN 5-325 MG PO TABS
ORAL_TABLET | ORAL | Status: AC
  Administered 2011-12-01: 1 via ORAL
  Filled 2011-12-01: qty 1

## 2011-12-01 MED ORDER — SPIRONOLACTONE 12.5 MG HALF TABLET
12.5000 mg | ORAL_TABLET | ORAL | Status: DC
  Administered 2011-12-01: 12.5 mg via ORAL
  Filled 2011-12-01: qty 1

## 2011-12-01 MED ORDER — MORPHINE SULFATE 2 MG/ML IJ SOLN: 1.0000 mg | Freq: Once | INTRAMUSCULAR | Status: DC

## 2011-12-01 MED ORDER — HYDROCODONE-ACETAMINOPHEN 5-325 MG PO TABS
1.0000 | ORAL_TABLET | Freq: Four times a day (QID) | ORAL | Status: DC | PRN
  Administered 2011-12-01 – 2011-12-02 (×2): 2 via ORAL
  Filled 2011-12-01 (×2): qty 2

## 2011-12-01 MED ORDER — HYDROCODONE-ACETAMINOPHEN 5-325 MG PO TABS
1.0000 | ORAL_TABLET | Freq: Once | ORAL | Status: AC
  Administered 2011-12-01: 1 via ORAL

## 2011-12-01 MED ORDER — LOTEPREDNOL ETABONATE 0.5 % OP SUSP
1.0000 [drp] | Freq: Three times a day (TID) | OPHTHALMIC | Status: DC
  Administered 2011-12-01 – 2011-12-02 (×3): 1 [drp] via OPHTHALMIC
  Filled 2011-12-01: qty 5

## 2011-12-01 MED ORDER — ACETAMINOPHEN 325 MG PO TABS
650.0000 mg | ORAL_TABLET | Freq: Once | ORAL | Status: AC
  Administered 2011-12-01: 650 mg via ORAL

## 2011-12-01 MED ORDER — BIMATOPROST 0.01 % OP SOLN
1.0000 [drp] | Freq: Every day | OPHTHALMIC | Status: DC
  Administered 2011-12-01: 1 [drp] via OPHTHALMIC
  Filled 2011-12-01: qty 2.5

## 2011-12-01 MED ORDER — ONDANSETRON HCL 4 MG/2ML IJ SOLN
4.0000 mg | Freq: Four times a day (QID) | INTRAMUSCULAR | Status: DC | PRN
  Administered 2011-12-02: 4 mg via INTRAVENOUS
  Filled 2011-12-01: qty 2

## 2011-12-01 MED ORDER — NITROGLYCERIN 0.4 MG SL SUBL: 0.4000 mg | SUBLINGUAL_TABLET | SUBLINGUAL | Status: DC | PRN

## 2011-12-01 MED ORDER — ATORVASTATIN CALCIUM 10 MG PO TABS
10.0000 mg | ORAL_TABLET | Freq: Every day | ORAL | Status: DC
  Administered 2011-12-01: 10 mg via ORAL
  Filled 2011-12-01 (×2): qty 1

## 2011-12-01 MED ORDER — SODIUM CHLORIDE 0.9 % IV SOLN
INTRAVENOUS | Status: DC
  Administered 2011-12-01: 250 mL via INTRAVENOUS
  Administered 2011-12-02: 20 mL/h via INTRAVENOUS

## 2011-12-01 MED ORDER — MIDAZOLAM HCL 5 MG/5ML IJ SOLN
INTRAMUSCULAR | Status: AC | PRN
  Administered 2011-12-01: 2 mg via INTRAVENOUS

## 2011-12-01 MED ORDER — FAMOTIDINE 20 MG PO TABS
20.0000 mg | ORAL_TABLET | Freq: Two times a day (BID) | ORAL | Status: DC
  Administered 2011-12-01 – 2011-12-02 (×3): 20 mg via ORAL
  Filled 2011-12-01 (×4): qty 1

## 2011-12-01 MED ORDER — MIDAZOLAM HCL 2 MG/2ML IJ SOLN
INTRAMUSCULAR | Status: AC
  Filled 2011-12-01: qty 6

## 2011-12-01 NOTE — Progress Notes (Signed)
CBC results and urine output called to Dr. Miles Costain, orders received, will continue to monitor.  Alonna Buckler 12/01/2011

## 2011-12-01 NOTE — H&P (Signed)
[name] is an 66 y.o. female.   Chief Complaint: Sarcoidosis; Acute renal failure HPI: scheduled now for renal biopsy  Past Medical History  Diagnosis Date  . NICM (nonischemic cardiomyopathy)     echo 1/11: EF 35-40%, mod LVH, Grade 1 diast dysfxn, mild BAE;    cath 2/06: luminal irregs;  Myoview 10/2:  no scar or ischemia, EF 37%   . Pacemaker     pacesetter. 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 Chronic systolic heart failure   . HTN (hypertension)   . HLD (hyperlipidemia)   . Sarcoidosis   . Complete heart block   . Atrial tachycardia   . Diverticulosis   . CKD (chronic kidney disease)     Past Surgical History  Procedure Date  . Av node ablation   . Status post dual chamber pacer     [employer] model #[id number], Identify  . Abdominal hysterectomy     Family History  Problem Relation Age of Onset  . Heart disease Other   . Diabetes Other    Social History:  reports that she quit smoking about 23 years ago. She does not have any smokeless tobacco history on file. She reports that she does not drink alcohol or use illicit drugs.  Allergies:  Allergies  Allergen Reactions  . Codeine Itching  . Penicillins Itching    Medications Prior to Admission  Medication Sig Dispense Refill  . aspirin 81 MG tablet Take 81 mg by mouth daily.        . bimatoprost (LUMIGAN) 0.01 % SOLN Place 1 drop into the right eye at bedtime.       . carvedilol (COREG) 25 MG tablet Take 0.5 tablets (12.5 mg total) by mouth 2 (two) times daily.      . famotidine (PEPCID) 20 MG tablet Take 20 mg by mouth 2 (two) times daily.      . furosemide (LASIX) 20 MG tablet Take 20 mg by mouth daily as needed. For excess water retention      . losartan (COZAAR) 25 MG tablet Take 0.5 tablets (12.5 mg total) by mouth daily.      Marland Kitchen loteprednol (LOTEMAX) 0.5 % ophthalmic suspension Place 1 drop into both eyes 3 (three) times daily.        . methylcellulose (ARTIFICIAL TEARS) 1 % ophthalmic  solution Place 1 drop into both eyes as needed. For dry eyes      . OMEGA-3 KRILL OIL 300 MG CAPS Take 1 capsule by mouth daily.      . rosuvastatin (CRESTOR) 10 MG tablet Take 10 mg by mouth daily.        Marland Kitchen spironolactone (ALDACTONE) 25 MG tablet Take 12.5 mg by mouth every other day.      . nitroGLYCERIN (NITROSTAT) 0.4 MG SL tablet Place 0.4 mg under the tongue every 5 (five) minutes as needed. For chest pain       Medications Prior to Admission  Medication Dose Route Frequency Provider Last Rate Last Dose  . 0.9 %  sodium chloride infusion   Intravenous Once Brenda Leu, PA 20 mL/hr at 12/01/11 2725      Results for orders placed during the hospital encounter of 12/01/11 (from the past 48 hour(s))  APTT     Status: Normal   Collection Time   12/01/11  8:18 AM      Component Value Range Comment   aPTT 29  24 - 37 (seconds)   CBC  Status: Abnormal   Collection Time   12/01/11  8:18 AM      Component Value Range Comment   WBC 4.6  4.0 - 10.5 (K/uL)    RBC 3.53 (*) 3.87 - 5.11 (MIL/uL)    Hemoglobin 10.6 (*) 12.0 - 15.0 (g/dL)    HCT 40.9 (*) 81.1 - 46.0 (%)    MCV 90.1  78.0 - 100.0 (fL)    MCH 30.0  26.0 - 34.0 (pg)    MCHC 33.3  30.0 - 36.0 (g/dL)    RDW 91.4  78.2 - 95.6 (%)    Platelets 238  150 - 400 (K/uL)   PROTIME-INR     Status: Normal   Collection Time   12/01/11  8:18 AM      Component Value Range Comment   Prothrombin Time 13.9  11.6 - 15.2 (seconds)    INR 1.05  0.00 - 1.49     No results found.  Review of Systems  Constitutional: Negative for fever.  Respiratory: Negative for cough.   Cardiovascular: Negative for chest pain.  Gastrointestinal: Negative for nausea, vomiting and abdominal pain.  Neurological: Negative for headaches.    Blood pressure 140/77, pulse 69, temperature 96.8 F (36 C), temperature source Oral, resp. rate 20, height 5\' 3"  (1.6 m), weight 187 lb (84.823 kg), SpO2 99.00%. Physical Exam  Constitutional: She is oriented to  person, place, and time. She appears well-developed and well-nourished.  HENT:  Head: Normocephalic.  Eyes: EOM are normal.  Neck: Normal range of motion.  Cardiovascular: Normal rate, regular rhythm and normal heart sounds.   No murmur heard. Respiratory: Effort normal and breath sounds normal. She has no wheezes.  GI: Soft. Bowel sounds are normal. There is no tenderness.  Musculoskeletal: Normal range of motion.  Neurological: She is alert and oriented to person, place, and time.  Skin: Skin is warm and dry.     Assessment/Plan Sarcoidosis; ARF Scheduled now for renal biopsy in IR Pt aware of procedure benefits and risks and agreeable to proceed. Consent signed.  [name] A 12/01/2011, 9:11 AM

## 2011-12-01 NOTE — Progress Notes (Signed)
  Subjective: Some improvement in pain after meds in short stay.  No more N/V  Objective: Vital signs in last 24 hours: Temp:  [96.8 F (36 C)-97.9 F (36.6 C)] 97.9 F (36.6 C) (02/25 1620) Pulse Rate:  [62-72] 68  (02/25 1620) Resp:  [15-21] 18  (02/25 1620) BP: (111-147)/(67-80) 124/74 mmHg (02/25 1620) SpO2:  [95 %-100 %] 96 % (02/25 1620) Weight:  [187 lb (84.823 kg)] 187 lb (84.823 kg) (02/25 0812) Last BM Date: 11/30/11  Intake/Output from previous day:   Intake/Output this shift:    GI: abnormal findings:  mild tenderness in the left flank  Lab Results:   Basename 12/01/11 0818  WBC 4.6  HGB 10.6*  HCT 31.8*  PLT 238   BMET No results found for this basename: NA:2,K:2,CL:2,CO2:2,GLUCOSE:2,BUN:2,CREATININE:2,CALCIUM:2 in the last 72 hours PT/INR  Basename 12/01/11 0818  LABPROT 13.9  INR 1.05   ABG No results found for this basename: PHART:2,PCO2:2,PO2:2,HCO3:2 in the last 72 hours  Studies/Results: US Biopsy  12/01/2011  *RADIOLOGY REPORT*  Clinical Data: Sarcoidosis, chronic renal insufficiency  ULTRASOUND LEFT RENAL CORTEX 16 GAUGE CORE BIOPSY  Date:  12/01/2011 10:00:00  Radiologist:  M. Ruel Favors, M.D.  Medications:  Versed and Fentanyl for conscious sedation  Guidance:  Ultrasound  Fluoroscopy time:  None.  Sedation time:  15 minutes  Contrast volume:  None.  Complications:  No immediate  PROCEDURE/FINDINGS:  Informed consent was obtained from the patient following explanation of the procedure, risks, benefits and alternatives. The patient understands, agrees and consents for the procedure. All questions were addressed.  A time out was performed.  Maximal barrier sterile technique utilized including caps, mask, sterile gowns, sterile gloves, large sterile drape, hand hygiene, and betadine  Preliminary ultrasound performed of the kidneys.  Left kidney lower pole was well visualized.  Right kidney has an incidental lower pole cyst.  Therefore the left kidney  was chosen for core biopsy.  Under sterile conditions and local anesthesia, direct ultrasound guidance was utilized to advance a 16 gauge 15 cm core biopsy device to the left kidney lower pole cortex.  Three passes were made yielding two intact non fragmented 16 gauge core biopsies. These were placed in saline.  These were deemed adequate by the pathology PA.  Post procedure imaging demonstrates no evidence of hemorrhage or hematoma.  The patient tolerated the procedure well. No immediate complication.  IMPRESSION: Successful ultrasound left kidney lower pole renal cortex 16 gauge core biopsy  Original Report Authenticated By: Judie Petit. Ruel Favors, M.D.    Anti-infectives: Anti-infectives    None      Assessment/Plan: s/p * No surgery found * overnight observation.  follow up CBC and watch for hematuria  to resolve hopefully.  May need CT and or angio  if Hgb significantly drops or pain worsens  LOS: 0 days    Lazette Estala T. 12/01/2011

## 2011-12-01 NOTE — ED Notes (Signed)
Started O2 2L/

## 2011-12-01 NOTE — Procedures (Signed)
[place] LT kidney 18g core bx of the cortex No comp Stable

## 2011-12-01 NOTE — Progress Notes (Signed)
  Subjective: Sarcoidosis; Left renal biopsy 2/25 am Developed hematuria after procedure with left flank pain VSS Discussed with Dr Miles Costain Pt admitted for 24 hr obs; pt aware and agreeable   Objective: Vital signs in last 24 hours: Temp:  [96.8 F (36 C)] 96.8 F (36 C) (02/25 0812) Pulse Rate:  [62-72] 72  (02/25 1330) Resp:  [15-21] 16  (02/25 1145) BP: (111-147)/(67-80) 147/72 mmHg (02/25 1330) SpO2:  [96 %-100 %] 100 % (02/25 1330) Weight:  [187 lb (84.823 kg)] 187 lb (84.823 kg) (02/25 0812)    Intake/Output from previous day:   Intake/Output this shift:    PE: pt nausea better; no vomit in 1 hr so far In NAD VSS Lt flank site is slight tender: no bleeding; not swollen; no hematoma   Lab Results:   Basename 12/01/11 0818  WBC 4.6  HGB 10.6*  HCT 31.8*  PLT 238   BMET No results found for this basename: NA:2,K:2,CL:2,CO2:2,GLUCOSE:2,BUN:2,CREATININE:2,CALCIUM:2 in the last 72 hours PT/INR  Basename 12/01/11 0818  LABPROT 13.9  INR 1.05   ABG No results found for this basename: PHART:2,PCO2:2,PO2:2,HCO3:2 in the last 72 hours  Studies/Results: US Biopsy  12/01/2011  *RADIOLOGY REPORT*  Clinical Data: Sarcoidosis, chronic renal insufficiency  ULTRASOUND LEFT RENAL CORTEX 16 GAUGE CORE BIOPSY  Date:  12/01/2011 10:00:00  Radiologist:  M. Ruel Favors, M.D.  Medications:  Versed and Fentanyl for conscious sedation  Guidance:  Ultrasound  Fluoroscopy time:  None.  Sedation time:  15 minutes  Contrast volume:  None.  Complications:  No immediate  PROCEDURE/FINDINGS:  Informed consent was obtained from the patient following explanation of the procedure, risks, benefits and alternatives. The patient understands, agrees and consents for the procedure. All questions were addressed.  A time out was performed.  Maximal barrier sterile technique utilized including caps, mask, sterile gowns, sterile gloves, large sterile drape, hand hygiene, and betadine  Preliminary  ultrasound performed of the kidneys.  Left kidney lower pole was well visualized.  Right kidney has an incidental lower pole cyst.  Therefore the left kidney was chosen for core biopsy.  Under sterile conditions and local anesthesia, direct ultrasound guidance was utilized to advance a 16 gauge 15 cm core biopsy device to the left kidney lower pole cortex.  Three passes were made yielding two intact non fragmented 16 gauge core biopsies. These were placed in saline.  These were deemed adequate by the pathology PA.  Post procedure imaging demonstrates no evidence of hemorrhage or hematoma.  The patient tolerated the procedure well. No immediate complication.  IMPRESSION: Successful ultrasound left kidney lower pole renal cortex 16 gauge core biopsy  Original Report Authenticated By: Judie Petit. Ruel Favors, M.D.    Anti-infectives: Anti-infectives    None      Assessment/Plan: S/p: Left renal biopsy in IR Sarcoidosis Developed hematuria and admitted 23 hrs overnight observation Dr Miles Costain aware of pt status     [name] A 12/01/2011

## 2011-12-02 ENCOUNTER — Other Ambulatory Visit (HOSPITAL_COMMUNITY): Payer: PRIVATE HEALTH INSURANCE

## 2011-12-02 ENCOUNTER — Telehealth (HOSPITAL_COMMUNITY): Payer: Self-pay

## 2011-12-02 LAB — CBC
HCT: 30.3 % — ABNORMAL LOW (ref 36.0–46.0)
Hemoglobin: 9.8 g/dL — ABNORMAL LOW (ref 12.0–15.0)
MCH: 29.5 pg (ref 26.0–34.0)
MCHC: 32.3 g/dL (ref 30.0–36.0)
MCV: 91.3 fL (ref 78.0–100.0)
Platelets: 245 K/uL (ref 150–400)
RBC: 3.32 MIL/uL — ABNORMAL LOW (ref 3.87–5.11)
RDW: 13.5 % (ref 11.5–15.5)
WBC: 7.7 K/uL (ref 4.0–10.5)

## 2011-12-02 NOTE — Discharge Summary (Signed)
Physician Discharge Summary  Patient ID: [name] MRN: [id number] DOB/AGE: 10/06/1946 66 y.o.  Admit date: 12/01/2011 Discharge date: 12/02/2011  Admission Diagnoses: Hematuria  Discharge Diagnoses: Hematuria post renal biopsy resolved Active Problems:  * No active hospital problems. *    Discharged Condition: stable; improved  Hospital Course: Pt with History of Sarcoidosis; Chronic kidney disease. Left renal biopsy was performed in Radiology 2/25 as ordered by Dr Lowell Guitar. Developed hematuria post procedure without change in vital signs. Left flank pain and nausea/vomiting. Was admitted overnight for observation and monitoring of these symptoms.  Nausea and vomiting resolved quickly; hematuria resolved overnight and flank pain has diminished but still there slightly. Urine output is good and clear yellow. Eating and drinking well. Slept well. For discharge today. Dr Miles Costain aware of pt status and agreeable.  Consults: None  Significant Diagnostic Studies: Left renal biopsy 2/25  Treatments: None  Discharge Exam: Blood pressure 119/73, pulse 67, temperature 98.3 F (36.8 C), temperature source Oral, resp. rate 18, height 5\' 3"  (1.6 m), weight 187 lb (84.823 kg), SpO2 96.00%.  PE:  VSS; afeb Pt in NAD H/H stable Heart: RRR w/o M Lungs: CTA Abd; soft; +BS; no masses Slight tender to palpate deeply at LLQ; less painful than 2/25 Left flank: clean and dry; no bleeding; no hematoma Slight tender to palpate; less painful than 2/25  Results for orders placed during the hospital encounter of 12/01/11  APTT      Component Value Range   aPTT 29  24 - 37 (seconds)  CBC      Component Value Range   WBC 4.6  4.0 - 10.5 (K/uL)   RBC 3.53 (*) 3.87 - 5.11 (MIL/uL)   Hemoglobin 10.6 (*) 12.0 - 15.0 (g/dL)   HCT 01.0 (*) 27.2 - 46.0 (%)   MCV 90.1  78.0 - 100.0 (fL)   MCH 30.0  26.0 - 34.0 (pg)   MCHC 33.3  30.0 - 36.0 (g/dL)   RDW 53.6  64.4 - 03.4 (%)   Platelets 238  150 -  400 (K/uL)  PROTIME-INR      Component Value Range   Prothrombin Time 13.9  11.6 - 15.2 (seconds)   INR 1.05  0.00 - 1.49   CBC      Component Value Range   WBC 7.7  4.0 - 10.5 (K/uL)   RBC 3.32 (*) 3.87 - 5.11 (MIL/uL)   Hemoglobin 9.8 (*) 12.0 - 15.0 (g/dL)   HCT 74.2 (*) 59.5 - 46.0 (%)   MCV 91.3  78.0 - 100.0 (fL)   MCH 29.5  26.0 - 34.0 (pg)   MCHC 32.3  30.0 - 36.0 (g/dL)   RDW 63.8  75.6 - 43.3 (%)   Platelets 245  150 - 400 (K/uL)  CBC      Component Value Range   WBC 8.0  4.0 - 10.5 (K/uL)   RBC 3.51 (*) 3.87 - 5.11 (MIL/uL)   Hemoglobin 10.4 (*) 12.0 - 15.0 (g/dL)   HCT 29.5 (*) 18.8 - 46.0 (%)   MCV 90.6  78.0 - 100.0 (fL)   MCH 29.6  26.0 - 34.0 (pg)   MCHC 32.7  30.0 - 36.0 (g/dL)   RDW 41.6  60.6 - 30.1 (%)   Platelets 259  150 - 400 (K/uL)    Disposition: Sarcoidosis and CKD Left renal biopsy 2/25 in Radiology Pt tolerated biopsy well but developed hematuria post procedure. N/V and flank pain also developed and was kept overnight for  23 hr observation. Pt did well overnight. All sxs resolved. Urine clear For DC today. Rx: Vicodin 5/500 #10 Pt to follow with Dr Lowell Guitar Call [phone] if problems or questions Dr Miles Costain aware of pt status and agreeable to discharge  Discharge Orders    Future Appointments: Provider: Department: Dept Phone: Center:   12/15/2011 10:30 AM [facility] [phone] None   02/23/2012 9:00 AM Duke Salvia, MD [facility] [phone] [facility]     Future Orders Please Complete By Expires   Diet - low sodium heart healthy      Increase activity slowly      Comments:   No lifting over 10 lbs x 2 days   Discharge instructions      Comments:   1. D/C home after lunch  2. Restful x 48 hrs  3. Follow with Dr Lowell Guitar  4. Call [phone] if problems or questions   Lifting restrictions      Comments:   No lifting over 10 lbs x 2 days   No wound care      Call MD for:  severe uncontrolled pain       Call MD for:  redness, tenderness, or signs of infection (pain, swelling, redness, odor or green/yellow discharge around incision site)        Medication List  As of 12/02/2011 10:50 AM   TAKE these medications         aspirin 81 MG tablet   Take 81 mg by mouth daily.      COREG 25 MG tablet   Generic drug: carvedilol   Take 0.5 tablets (12.5 mg total) by mouth 2 (two) times daily.      famotidine 20 MG tablet   Commonly known as: PEPCID   Take 20 mg by mouth 2 (two) times daily.      furosemide 20 MG tablet   Commonly known as: LASIX   Take 20 mg by mouth daily as needed. For excess water retention      losartan 25 MG tablet   Commonly known as: COZAAR   Take 0.5 tablets (12.5 mg total) by mouth daily.      LOTEMAX 0.5 % ophthalmic suspension   Generic drug: loteprednol   Place 1 drop into both eyes 3 (three) times daily.      LUMIGAN 0.01 % Soln   Generic drug: bimatoprost   Place 1 drop into the right eye at bedtime.      methylcellulose 1 % ophthalmic solution   Commonly known as: ARTIFICIAL TEARS   Place 1 drop into both eyes as needed. For dry eyes      nitroGLYCERIN 0.4 MG SL tablet   Commonly known as: NITROSTAT   Place 0.4 mg under the tongue every 5 (five) minutes as needed. For chest pain      OMEGA-3 KRILL OIL 300 MG Caps   Take 1 capsule by mouth daily.      rosuvastatin 10 MG tablet   Commonly known as: CRESTOR   Take 10 mg by mouth daily.      spironolactone 25 MG tablet   Commonly known as: ALDACTONE   Take 12.5 mg by mouth every other day.             Signed: Trentan Trippe A 12/02/2011, 10:50 AM

## 2011-12-02 NOTE — Progress Notes (Signed)
Utilization review complete 

## 2011-12-02 NOTE — Progress Notes (Signed)
D/D instructions reviewed with Pt. Pt D/Cd to home at 1600.

## 2011-12-15 ENCOUNTER — Encounter (HOSPITAL_COMMUNITY): Payer: PRIVATE HEALTH INSURANCE

## 2011-12-16 ENCOUNTER — Ambulatory Visit (HOSPITAL_COMMUNITY)
Admission: RE | Admit: 2011-12-16 | Discharge: 2011-12-16 | Disposition: A | Discharge status: Home / Self Care | Payer: PRIVATE HEALTH INSURANCE | Source: Ambulatory Visit | Attending: Internal Medicine | Admitting: Internal Medicine

## 2011-12-16 VITALS — BP 100/60 | HR 68 | Wt 184.8 lb

## 2011-12-16 DIAGNOSIS — Z880 Allergy status to penicillin: Secondary | ICD-10-CM | POA: Insufficient documentation

## 2011-12-16 DIAGNOSIS — N183 Chronic kidney disease, stage 3 unspecified: Secondary | ICD-10-CM | POA: Insufficient documentation

## 2011-12-16 DIAGNOSIS — K5732 Diverticulitis of large intestine without perforation or abscess without bleeding: Secondary | ICD-10-CM | POA: Insufficient documentation

## 2011-12-16 DIAGNOSIS — Z87891 Personal history of nicotine dependence: Secondary | ICD-10-CM | POA: Insufficient documentation

## 2011-12-16 DIAGNOSIS — Z7982 Long term (current) use of aspirin: Secondary | ICD-10-CM | POA: Insufficient documentation

## 2011-12-16 DIAGNOSIS — I495 Sick sinus syndrome: Secondary | ICD-10-CM | POA: Insufficient documentation

## 2011-12-16 DIAGNOSIS — Z950 Presence of cardiac pacemaker: Secondary | ICD-10-CM | POA: Insufficient documentation

## 2011-12-16 DIAGNOSIS — D869 Sarcoidosis, unspecified: Secondary | ICD-10-CM | POA: Insufficient documentation

## 2011-12-16 DIAGNOSIS — Z885 Allergy status to narcotic agent status: Secondary | ICD-10-CM | POA: Insufficient documentation

## 2011-12-16 DIAGNOSIS — I5022 Chronic systolic (congestive) heart failure: Secondary | ICD-10-CM

## 2011-12-16 DIAGNOSIS — I509 Heart failure, unspecified: Secondary | ICD-10-CM | POA: Insufficient documentation

## 2011-12-16 DIAGNOSIS — I129 Hypertensive chronic kidney disease with stage 1 through stage 4 chronic kidney disease, or unspecified chronic kidney disease: Secondary | ICD-10-CM | POA: Insufficient documentation

## 2011-12-16 NOTE — Patient Instructions (Addendum)
Schedule CPX  Schedule ECHO   Follow up in 1 month.   Do the following things EVERYDAY: 1) Weigh yourself in the morning before breakfast. Write it down and keep it in a log. 2) Take your medicines as prescribed 3) Eat low salt foods--Limit salt (sodium) to 2000mg  per day.  4) Stay as active as you can every day

## 2011-12-16 NOTE — Assessment & Plan Note (Addendum)
Explained purpose of Heart Failure Clinic. NYHA III. Volume status appears stable. Increased dyspnea on exertion.  Repeat ECHO.  Schedule CPX. Discussed sliding scale Lasix. She is instructed to take additional Lasix if her weight increases 3 pounds in 24 hours, daily weights, low salt diet. Follow up in 1 month.    Patient seen and examined with Tonye Becket, NP. We discussed all aspects of the encounter. I agree with the assessment and plan as stated above. Symptoms seem out of proportion to physical exam and clinical findings. Will plan CPX testing to further evaluation. Continue current meds. Reinforced need for daily weights and reviewed use of sliding scale diuretics.

## 2011-12-16 NOTE — Progress Notes (Signed)
Patient ID: [name], female   DOB: 25-Jul-1946, 66 y.o.   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 She has been in remission since 1984. Biopsy heart and lung preformed at [facility].   11/2004 LHC: luminal irregularities, no obstructive CAD. Last echocardiogram 1/11: Moderate LVH, EF 35-40%, grade 1 diastolic dysfunction, mild biatrial enlargement.  10/12 Myoview EF 37 %  D/C [facility] 12/01/11 biopsy of kidney.  Results not able to be found in EPIC.  She is here as a new patient per Natale Lay request. Over the last 2-3 weeks increased fatigue and dyspena on exertion. +PND +orthopnea. Unable to walk as much due to dyspnea. Sleeps on 4 pillows.   Complains of abdominal bloating. Denies lower extremity   Weight at home 184-191. She has not required additional Lasix. Compliant with medications. Wakes up exhausted. Not sleeping well. 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   Review of Systems:     Cardiac Review of Systems: {Y] = yes [ ]  = no  Chest Pain [    ]  Resting SOB [ Y  ] Exertional SOB  [ Y ]  Orthopnea [  ]   Pedal Edema [   ]    Palpitations [  ] Syncope  [  ]   Presyncope [   ]  General Review of Systems: [Y] = yes [  ]=no Constitional: recent weight change [Y  ]; anorexia [  ]; fatigue [  ]; nausea [  ]; night sweats [  ]; fever [  ]; or chills [  ];                                                                                                                                          Dental: poor dentition[  ]; Last Dentist visit:   Eye : blurred vision [  ]; diplopia [   ]; vision changes [  ];  Amaurosis fugax[  ]; Resp: cough [  ];  wheezing[  ];  hemoptysis[  ]; shortness of  breath[Y  ]; paroxysmal nocturnal dyspnea[ Y ]; dyspnea on exertion[Y  ]; or orthopnea[ Y ];  GI:  gallstones[  ], vomiting[  ];  dysphagia[  ]; melena[  ];  hematochezia [  ]; heartburn[  ];   Hx of  Colonoscopy[  ]; GU: kidney stones [  ]; hematuria[  ];   dysuria [  ];  nocturia[  ];  history of     obstruction [  ];                  Skin: rash, swelling[  ];,  hair loss[  ];  peripheral edema[  ];  or itching[  ]; Musculosketetal: myalgias[  ];  joint swelling[  ];  joint erythema[  ];  joint pain[  ];  back pain[ Y ];  Heme/Lymph: bruising[  ];  bleeding[  ];  anemia[  ];  Neuro: TIA[  ];  headaches[  ];  stroke[  ];  vertigo[  ];  seizures[  ];   paresthesias[  ];  difficulty walking[  ];  Psych:depression[  ]; anxiety[  ];  Endocrine: diabetes[  ];  thyroid dysfunction[  ];  Immunizations: Flu [  ]; Pneumococcal[  ];  Other:    Past Medical History  Diagnosis Date  . NICM (nonischemic cardiomyopathy)     echo 1/11: EF 35-40%, mod LVH, Grade 1 diast dysfxn, mild BAE;    cath 2/06: luminal irregs;  Myoview 10/2:  no scar or ischemia, EF 37%   . Pacemaker     pacesetter. 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 Chronic systolic heart failure   . HTN (hypertension)   . HLD (hyperlipidemia)   . Sarcoidosis   . Complete heart block   . Atrial tachycardia   . Diverticulosis   . CKD (chronic kidney disease)     Current Outpatient Prescriptions  Medication Sig Dispense Refill  . aspirin 81 MG tablet Take 81 mg by mouth daily.        . bimatoprost (LUMIGAN) 0.01 % SOLN Place 1 drop into the right eye at bedtime.       . carvedilol (COREG) 25 MG tablet Take 0.5 tablets (12.5 mg total) by mouth 2 (two) times daily.      . famotidine (PEPCID) 20 MG tablet Take 20 mg by mouth 2 (two) times daily.      . furosemide (LASIX) 20 MG tablet Take 20 mg by mouth daily as needed. For excess water retention      . losartan (COZAAR) 25 MG tablet Take 0.5 tablets (12.5 mg total) by  mouth daily.      Marland Kitchen loteprednol (LOTEMAX) 0.5 % ophthalmic suspension Place 1 drop into both eyes 3 (three) times daily.        . methylcellulose (ARTIFICIAL TEARS) 1 % ophthalmic solution Place 1 drop into both eyes as needed. For dry eyes      . nitroGLYCERIN (NITROSTAT) 0.4 MG SL tablet Place 0.4 mg under the tongue every 5 (five) minutes as needed. For chest pain      . OMEGA-3 KRILL OIL 300 MG CAPS Take 1 capsule by mouth daily.      . rosuvastatin (CRESTOR) 10 MG tablet Take 10 mg by mouth daily.        Marland Kitchen spironolactone (ALDACTONE) 25 MG tablet Take 12.5 mg by mouth every other day.         Allergies  Allergen Reactions  . Codeine Itching  . Penicillins Itching    History   Social History  . Marital Status: Legally Separated    Spouse Name: N/A    Number of Children: 5  . Years of Education: N/A   Occupational History  . disabled    Social History Main Topics  . Smoking status: Former Smoker    Quit date: 10/06/1988  . Smokeless tobacco: Not on file  . Alcohol Use: No  . Drug Use: No  . Sexually Active: Not on file   Other Topics Concern  . Not on file   Social History Narrative  Retired.     Family History  Problem Relation Age of Onset  . Heart disease Other   . Diabetes Other     PHYSICAL EXAM: Filed Vitals:   12/16/11 0948  BP: 100/60  Pulse: 68   General:  Well appearing. No respiratory difficulty HEENT: normal Neck: supple. JVD 5-6. Carotids 2+ bilat; no bruits. No lymphadenopathy or thryomegaly appreciated. Cor: PMI nondisplaced. Regular rate & rhythm. No rubs, gallops or murmurs. Lungs: Coarse throughout Abdomen: obese, soft, nontender, distended. No hepatosplenomegaly. No bruits or masses. Good bowel sounds. Extremities: no cyanosis, clubbing, rash, edema Neuro: alert & oriented x 3, cranial nerves grossly intact. moves all 4 extremities w/o difficulty. Affect pleasant.    No results found for this or any previous visit (from the past  24 hour(s)). No results found.   ASSESSMENT & PLAN:

## 2011-12-24 ENCOUNTER — Encounter: Payer: Self-pay | Admitting: Internal Medicine

## 2012-01-05 ENCOUNTER — Ambulatory Visit (HOSPITAL_COMMUNITY): Payer: PRIVATE HEALTH INSURANCE | Attending: Adult Health

## 2012-01-05 ENCOUNTER — Ambulatory Visit (HOSPITAL_COMMUNITY)
Admission: RE | Admit: 2012-01-05 | Discharge: 2012-01-05 | Disposition: A | Discharge status: Home / Self Care | Payer: PRIVATE HEALTH INSURANCE | Source: Ambulatory Visit | Attending: Adult Health | Admitting: Adult Health

## 2012-01-05 DIAGNOSIS — I509 Heart failure, unspecified: Secondary | ICD-10-CM | POA: Insufficient documentation

## 2012-01-05 DIAGNOSIS — R0609 Other forms of dyspnea: Secondary | ICD-10-CM | POA: Insufficient documentation

## 2012-01-05 DIAGNOSIS — R0989 Other specified symptoms and signs involving the circulatory and respiratory systems: Secondary | ICD-10-CM | POA: Insufficient documentation

## 2012-01-05 DIAGNOSIS — R0602 Shortness of breath: Secondary | ICD-10-CM

## 2012-01-05 DIAGNOSIS — I5022 Chronic systolic (congestive) heart failure: Secondary | ICD-10-CM

## 2012-01-05 DIAGNOSIS — I517 Cardiomegaly: Secondary | ICD-10-CM

## 2012-01-05 DIAGNOSIS — I10 Essential (primary) hypertension: Secondary | ICD-10-CM | POA: Insufficient documentation

## 2012-01-05 NOTE — Progress Notes (Signed)
  Echocardiogram 2D Echocardiogram has been performed.  Dorena Cookey 01/05/2012, 2:21 PM

## 2012-01-19 ENCOUNTER — Ambulatory Visit (HOSPITAL_COMMUNITY)
Admission: RE | Admit: 2012-01-19 | Discharge: 2012-01-19 | Disposition: A | Discharge status: Home / Self Care | Payer: PRIVATE HEALTH INSURANCE | Source: Ambulatory Visit | Attending: Internal Medicine | Admitting: Internal Medicine

## 2012-01-19 VITALS — BP 100/60 | HR 79 | Wt 187.0 lb

## 2012-01-19 DIAGNOSIS — N183 Chronic kidney disease, stage 3 unspecified: Secondary | ICD-10-CM

## 2012-01-19 DIAGNOSIS — I5022 Chronic systolic (congestive) heart failure: Secondary | ICD-10-CM

## 2012-01-19 DIAGNOSIS — D869 Sarcoidosis, unspecified: Secondary | ICD-10-CM

## 2012-01-19 MED ORDER — LOSARTAN POTASSIUM 25 MG PO TABS: 25.0000 mg | ORAL_TABLET | Freq: Every day | ORAL | Status: DC

## 2012-01-19 NOTE — Progress Notes (Addendum)
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 She has been in remission since 1984. Biopsy heart and lung preformed at [facility].   11/2004 LHC: luminal irregularities, no obstructive CAD. Echocardiogram 1/11: Moderate LVH, EF 35-40%, grade 1 diastolic dysfunction, mild biatrial enlargement.  10/12 Myoview EF 37 % no ischemia or scar  D/C [facility] 12/01/11 biopsy of kidney.  Results not able to be found in EPIC.  Referred to HF clinic earlier this year (2013) for further evaluation and management.  CPX 01/05/12: peak VO2 13.1 (% pred 71.4%), VE/VCO2 slope 24.5, Peak RER 1.15, Vent Threshold 9.6% (% pred 52.3%), VE/MVV 90%, FVC 1.48 (47% pred), FEV1 1.01 (42%), FEV1/FVC 69%, MVV 39 (44%) - felt  to represent mild to moderate limitation primarily due to lung disease  Echo 01/05/12: LVEF 35-40%, LV with decreased motion of the septum with dyssynergy and decreased motion of the anterior wall.  RV mildly dilated with mildly reduced systolic function.  RA mildly dilated.  Trivial pericardial effusion.  (findings similar to echo in 2011)  She returns for follow up today.  Feels pretty good today.  Had gastritis last week, this has improved.  Had nose bleed on Saturday.  +cough over last 4 days, productive with blood tinge sputum .  + drainage down back through.  -fever/chills.  Dyspnea on exertion is at baseline.  Orthopnea improved from 4 pillows to 2.  Denies lower extremity edema.  Weight at home stable @ 184-185 pounds.  Not taking lasix.     Review of Systems: Pertinent positives and negatives as in HPI, otherwise negative   Past Medical History  Diagnosis Date  . NICM (nonischemic cardiomyopathy)     echo 1/11: EF 35-40%, mod LVH,  Grade 1 diast dysfxn, mild BAE;    cath 2/06: luminal irregs;  Myoview 10/2:  no scar or ischemia, EF 37%   . Pacemaker     pacesetter. 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 Chronic systolic heart failure   . HTN (hypertension)   . HLD (hyperlipidemia)   . Sarcoidosis   . Complete heart block   . Atrial tachycardia   . Diverticulosis   . CKD (chronic kidney disease)     Current Outpatient Prescriptions  Medication Sig Dispense Refill  . aspirin 81 MG tablet Take 81 mg by mouth daily.        . bimatoprost (LUMIGAN) 0.01 % SOLN Place 1 drop into the right eye at bedtime.       . carvedilol (COREG) 25 MG tablet Take 0.5 tablets (12.5 mg total) by mouth 2 (two) times daily.      . famotidine (PEPCID) 20 MG tablet Take 20 mg by mouth 2 (two) times daily.      . furosemide (LASIX) 20 MG tablet Take 20 mg by mouth daily as needed. For excess water retention      . losartan (COZAAR) 25 MG tablet Take 0.5 tablets (12.5 mg total) by mouth daily.      Marland Kitchen loteprednol (LOTEMAX) 0.5 % ophthalmic suspension Place 1 drop into both eyes 3 (three) times daily.        . methylcellulose (ARTIFICIAL TEARS) 1 % ophthalmic solution Place 1 drop into both eyes as needed. For dry eyes      .  nitroGLYCERIN (NITROSTAT) 0.4 MG SL tablet Place 0.4 mg under the tongue every 5 (five) minutes as needed. For chest pain      . OMEGA-3 KRILL OIL 300 MG CAPS Take 1 capsule by mouth daily.      . rosuvastatin (CRESTOR) 10 MG tablet Take 10 mg by mouth daily.        Marland Kitchen spironolactone (ALDACTONE) 25 MG tablet Take 12.5 mg by mouth every other day.         Allergies  Allergen Reactions  . Codeine Itching  . Penicillins Itching    PHYSICAL EXAM: Filed Vitals:   01/19/12 1005  BP: 100/60  Pulse: 79  Weight: 187 lb (84.823 kg)  SpO2: 98%    General:  Well appearing. No respiratory difficulty HEENT: normal Neck: supple. JVD 6-7. Carotids 2+ bilat; no bruits. No lymphadenopathy or thryomegaly  appreciated. Cor: PMI nondisplaced. Regular rate & rhythm. No rubs, gallops or murmurs. Lungs: Coarse throughout Abdomen: obese, soft, nontender, distended. No hepatosplenomegaly. No bruits or masses. Good bowel sounds. Extremities: no cyanosis, clubbing, rash, edema Neuro: alert & oriented x 3, cranial nerves grossly intact. moves all 4 extremities w/o difficulty. Affect pleasant.   ASSESSMENT & PLAN:

## 2012-01-19 NOTE — Assessment & Plan Note (Addendum)
Volume status looks good today.  Reviewed echo and CPX in full.  Functional status primarily limited by pulmonary disease.  Will titrate losartan to 25 mg as tolerated.  She will need further work up of pulmonary disease, will start with High resolution CT of chest w/o contrast (Cr 1.9).  Ideally would consider MRI of the heart to further evaluate sarcoid but with elevated Cr will hold off at this time.  Will also check PFTS with DLCO and refer to Pulmonary for further management.    Patient seen and examined with Ulyess Blossom, PA-C. We discussed all aspects of the encounter. I agree with the assessment and plan as stated above.  Volume status looks good on exam. Despite mild to moderate LV dysfunction main limitation seems to be pulmonary in nature. Will get CT and PFTs to assess for degree of parenchymal lung disease due to sarcoid. LV function currently stable. Refer to pulmonary to get their input as well.

## 2012-01-19 NOTE — Patient Instructions (Addendum)
Increase Losartan 1 tab daily.  Will schedule CT of the lungs and PFTs to further evaluate lung disease.    Referral to Pulmonary after the above test are completed.    Do the following things EVERYDAY: 1) Weigh yourself in the morning before breakfast. Write it down and keep it in a log. 2) Take your medicines as prescribed 3) Eat low salt foods--Limit salt (sodium) to 2000mg  per day.  4) Stay as active as you can everyday 5) Keep fluids less than 2 liters a day.

## 2012-01-21 ENCOUNTER — Ambulatory Visit (HOSPITAL_COMMUNITY)
Admission: RE | Admit: 2012-01-21 | Discharge: 2012-01-21 | Disposition: A | Discharge status: Home / Self Care | Payer: PRIVATE HEALTH INSURANCE | Source: Ambulatory Visit | Attending: Physician Assistant | Admitting: Physician Assistant

## 2012-01-21 DIAGNOSIS — J90 Pleural effusion, not elsewhere classified: Secondary | ICD-10-CM | POA: Insufficient documentation

## 2012-01-21 DIAGNOSIS — J99 Respiratory disorders in diseases classified elsewhere: Secondary | ICD-10-CM | POA: Insufficient documentation

## 2012-01-21 DIAGNOSIS — R0602 Shortness of breath: Secondary | ICD-10-CM | POA: Insufficient documentation

## 2012-01-21 DIAGNOSIS — D869 Sarcoidosis, unspecified: Secondary | ICD-10-CM | POA: Insufficient documentation

## 2012-01-21 DIAGNOSIS — R05 Cough: Secondary | ICD-10-CM | POA: Insufficient documentation

## 2012-01-21 DIAGNOSIS — R059 Cough, unspecified: Secondary | ICD-10-CM | POA: Insufficient documentation

## 2012-02-13 ENCOUNTER — Encounter: Payer: Self-pay | Admitting: Pulmonary Disease

## 2012-02-13 ENCOUNTER — Ambulatory Visit (INDEPENDENT_AMBULATORY_CARE_PROVIDER_SITE_OTHER): Payer: Medicare Other | Admitting: Pulmonary Disease

## 2012-02-13 VITALS — BP 116/64 | HR 73 | Temp 98.2°F | Ht 63.0 in | Wt 189.0 lb

## 2012-02-13 DIAGNOSIS — R0989 Other specified symptoms and signs involving the circulatory and respiratory systems: Secondary | ICD-10-CM

## 2012-02-13 DIAGNOSIS — D869 Sarcoidosis, unspecified: Secondary | ICD-10-CM

## 2012-02-13 DIAGNOSIS — R0600 Dyspnea, unspecified: Secondary | ICD-10-CM | POA: Insufficient documentation

## 2012-02-13 LAB — PULMONARY FUNCTION TEST

## 2012-02-13 NOTE — Progress Notes (Signed)
PFT done today. 

## 2012-02-13 NOTE — Progress Notes (Signed)
Chief Complaint  Patient presents with  . Advice Only    w/ PFT. refer Dr. Gala Romney. Pt c/o increase SOB w/ exertion and at bedtime x 3-4 months. Pt c/o dry hacking cough, wheezing and chest tx today   CC: Dr. Glennie Hawk, Orlin Hilding, Casimiro Needle, Berton Mount, Daniel Bensimhon  History of Present Illness: [name] is a 66 y.o. female for evaluation of cough.  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  She was treated with prednisone and had improvement.  She has not used prednisone for several years.  She has a history of non-ischemic cardiomyopathy with cardiac sarcoid.  She is followed by [facility].  Over the past one year she has noticed more trouble with a dry cough and wheeze.  She been getting progressively short of breath.  She denies fever or hemoptysis.  She does gets sweats occasionally.  Her weight has been steady.  She denies skin rashes or joint pain.    She can now only walk about 150 feet before having to stop and rest.  She is followed by Dr. Glennie Hawk with ophthalmology [phone]  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  She was recommend to start a medication (she thinks this was methotrexate).  Her primary physician then referred her to Dr. Orlin Hilding with rheumatology to start this medicine.  She is schedule for evaluation with Dr. Lendon Colonel later this month.  She is followed by Dr. Casimiro Needle for chronic kidney disease.  She is from [place], but also lived in [place] for 10 years.  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  She had pneumonia 4 years ago.  She is not aware of having tuberculosis.  She denies any significant occupational or animal exposures.  She smoked 1/2 pack per day, and quit in 1990.   Past Medical History  Diagnosis Date  . NICM (nonischemic cardiomyopathy)     echo 1/11: EF 35-40%, mod LVH, Grade 1 diast dysfxn, mild BAE;    cath 2/06: luminal irregs;   Myoview 10/2:  no scar or ischemia, EF 37%   . Pacemaker     pacesetter. 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 Chronic systolic heart failure   . HTN (hypertension)   . HLD (hyperlipidemia)   . Sarcoidosis   . Complete heart block   . Atrial tachycardia   . Diverticulosis   . CKD (chronic kidney disease)     Past Surgical History  Procedure Date  . Av node ablation   . Status post dual chamber pacer     [employer]. Jude model #5380, Identify  . Abdominal hysterectomy   . Tubal ligation     Current Outpatient Prescriptions on File Prior to Visit  Medication Sig Dispense Refill  . aspirin 81 MG tablet Take 81 mg by mouth daily.        . bimatoprost (LUMIGAN) 0.01 % SOLN Place 1 drop into the right eye at bedtime.       . carvedilol (COREG) 25 MG tablet Take 0.5 tablets (12.5 mg total) by mouth 2 (two) times daily.      . famotidine (PEPCID) 20 MG tablet Take 20 mg by mouth 2 (two) times daily.      . furosemide (LASIX) 20 MG tablet Take 20 mg by mouth daily as needed. For excess water retention      . losartan (COZAAR) 25 MG tablet  Take 1 tablet (25 mg total) by mouth daily.  30 tablet  6  . loteprednol (LOTEMAX) 0.5 % ophthalmic suspension Place 1 drop into both eyes 3 (three) times daily.        . methylcellulose (ARTIFICIAL TEARS) 1 % ophthalmic solution Place 1 drop into both eyes as needed. For dry eyes      . nitroGLYCERIN (NITROSTAT) 0.4 MG SL tablet Place 0.4 mg under the tongue every 5 (five) minutes as needed. For chest pain      . OMEGA-3 KRILL OIL 300 MG CAPS Take 1 capsule by mouth daily.      . rosuvastatin (CRESTOR) 10 MG tablet Take 10 mg by mouth daily.        Marland Kitchen spironolactone (ALDACTONE) 25 MG tablet Take 12.5 mg by mouth every other day.        Allergies  Allergen Reactions  . Codeine Itching  . Penicillins Itching    family history includes Asthma in her father; Colon cancer in her brother; Diabetes in her sister; Heart disease in her father;  and Rheum arthritis in her mother.   reports that she quit smoking about 23 years ago. Her smoking use included Cigarettes. She has a 4 pack-year smoking history. She does not have any smokeless tobacco history on file. She reports that she does not drink alcohol or use illicit drugs.  Review of Systems  Constitutional: Positive for unexpected weight change. Negative for fever and appetite change.  HENT: Negative for ear pain, congestion, sore throat, sneezing, trouble swallowing and dental problem.   Respiratory: Positive for cough and shortness of breath.   Cardiovascular: Negative for chest pain, palpitations and leg swelling.  Gastrointestinal: Negative for abdominal pain.  Musculoskeletal: Negative for joint swelling.  Skin: Negative for rash.  Neurological: Negative for headaches.  Psychiatric/Behavioral: Negative for dysphoric mood. The patient is not nervous/anxious.    Physical Exam: BP 116/64  Pulse 73  Temp(Src) 98.2 F (36.8 C) (Oral)  Ht 5\' 3"  (1.6 m)  Wt 189 lb (85.73 kg)  BMI 33.48 kg/m2  SpO2 98%  Body mass index is 33.48 kg/(m^2).   General - Obese HEENT - No sinus tenderness, no oral exudate, mild fullness of anterior cervical nodes Cardiac - s1s2 no murmur Chest - no wheeze/rales Abdomen - soft, nontender Extremities - no edema Neurologic - normal strength Skin - no rashes Psychiatric - normal mood behavior  CPST 01/05/12>>peak VO2 13.1 (% pred 71.4%), VE/VCO2 slope 24.5, Peak RER 1.15, Vent Threshold 9.6% (% pred 52.3%), VE/MVV 90%, FVC 1.48 (47% pred), FEV1 1.01 (42%), FEV1/FVC 69%, MVV 39 (44%) - felt to represent mild to moderate limitation primarily due to lung disease  Echo 01/05/12>>EF 35 to 40%  PFT 02/13/12>>FEV1 1.03 (49%), FEV1% 76, TLC 3.30 (71%), DLCO 49%, positive BD response.  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  CBC    Component Value Date/Time   WBC 7.7 12/02/2011 0545   RBC 3.32*  12/02/2011 0545   HGB 9.8* 12/02/2011 0545   HCT 30.3* 12/02/2011 0545   PLT 245 12/02/2011 0545   MCV 91.3 12/02/2011 0545   MCH 29.5 12/02/2011 0545   MCHC 32.3 12/02/2011 0545   RDW 13.5 12/02/2011 0545   LYMPHSABS 0.9 05/23/2011 1328   MONOABS 0.3 05/23/2011 1328   EOSABS 0.3 05/23/2011 1328   BASOSABS 0.0 05/23/2011 1328    BMET    Component Value Date/Time   NA 137 11/14/2011 1039   K  4.6 11/14/2011 1039   CL 105 11/14/2011 1039   CO2 28 11/14/2011 1039   GLUCOSE 82 11/14/2011 1039   BUN 27* 11/14/2011 1039   CREATININE 1.7* 11/14/2011 1039   CALCIUM 9.7 11/14/2011 1039   GFRNONAA 21* 11/11/2011 1135   GFRAA 25* 11/11/2011 1135    Lab Results  Component Value Date   ALT 16 05/24/2011   AST 22 05/24/2011   ALKPHOS 58 05/24/2011   BILITOT 0.2* 05/24/2011   Lab Results  Component Value Date   TSH 2.271 05/24/2011   Ct Chest Wo Contrast  01/21/2012  *RADIOLOGY REPORT*   Clinical Data: Shortness of breath.  Cough.  History of sarcoid.   CT CHEST WITHOUT CONTRAST   Technique:  Multidetector CT imaging of the chest was performed following the standard protocol without IV contrast.   Comparison: Prior chest radiographs dating back to 11/28/2005. Findings: Mediastinal lymph nodes measure up to 14 mm in short axis anterior to the right mainstem bronchus.  Hilar regions are difficult to definitively evaluate without IV contrast but adenopathy is suspected bilaterally.  No axillary adenopathy. Heart is enlarged.  No pericardial effusion.  There is fairly diffuse peribronchovascular and perifissural nodularity with mild associated septal thickening.  Tiny right pleural effusion. Airway is unremarkable.  High resolution imaging shows no subpleural reticulation, traction bronchiectasis or honeycombing.  No air trapping on inspiratory and expiratory imaging.  Incidental imaging of the upper abdomen shows low attenuation lesions in the liver measuring up to 11 mm in the dome.  No worrisome lytic or sclerotic lesions.    IMPRESSION:  1.  Mediastinal and suspected bihilar adenopathy with a perilymphatic distribution of nodularity in the lungs bilaterally. Findings are in keeping with the given history of sarcoid. 2.  Tiny right pleural effusion.   Original Report Authenticated By: Reyes Ivan, M.D.   Assessment/Plan:  Outpatient Encounter Prescriptions as of 02/13/2012  Medication Sig Dispense Refill  . aspirin 81 MG tablet Take 81 mg by mouth daily.        . bimatoprost (LUMIGAN) 0.01 % SOLN Place 1 drop into the right eye at bedtime.       . carvedilol (COREG) 25 MG tablet Take 0.5 tablets (12.5 mg total) by mouth 2 (two) times daily.      . famotidine (PEPCID) 20 MG tablet Take 20 mg by mouth 2 (two) times daily.      . furosemide (LASIX) 20 MG tablet Take 20 mg by mouth daily as needed. For excess water retention      . losartan (COZAAR) 25 MG tablet Take 1 tablet (25 mg total) by mouth daily.  30 tablet  6  . loteprednol (LOTEMAX) 0.5 % ophthalmic suspension Place 1 drop into both eyes 3 (three) times daily.        . methylcellulose (ARTIFICIAL TEARS) 1 % ophthalmic solution Place 1 drop into both eyes as needed. For dry eyes      . nitroGLYCERIN (NITROSTAT) 0.4 MG SL tablet Place 0.4 mg under the tongue every 5 (five) minutes as needed. For chest pain      . OMEGA-3 KRILL OIL 300 MG CAPS Take 1 capsule by mouth daily.      . rosuvastatin (CRESTOR) 10 MG tablet Take 10 mg by mouth daily.        Marland Kitchen spironolactone (ALDACTONE) 25 MG tablet Take 12.5 mg by mouth every other day.        Sarra Rachels Pager:  [phone] 02/13/2012, 4:08 PM

## 2012-02-13 NOTE — Patient Instructions (Signed)
Will speak with Eye doctor and call back about whether lung biopsy is needed PPD today Follow up in one month

## 2012-02-13 NOTE — Assessment & Plan Note (Addendum)
She has history of sarcoidosis.  She has progressive dyspnea, and dry cough.  She was found to have iritis which is felt to likely be from sarcoidosis.  Her chest radiographic findings, and pulmonary function test findings are detailed above.  These are all consistent with recurrence of sarcoidosis in her lungs.  She has been recommend to start systemic therapy by her ophthalmologist.  I believe this medication is methotrexate.  Will call her ophthalmologist to confirm.  She has been schedule for evaluation with Dr. Orlin Kline with rheumatology later this month.  Will place PPD.  Will await discuss with ophthalmology and assessment by rheumatology before deciding whether [name] needs additional airway and lung tissue sampling with bronchoscopy.

## 2012-02-13 NOTE — Progress Notes (Deleted)
  Subjective:    Patient ID: [name], female    DOB: Feb 07, 1946, 66 y.o.   MRN: [id number]  HPI    Review of Systems  Constitutional: Positive for unexpected weight change. Negative for fever and appetite change.  HENT: Negative for ear pain, congestion, sore throat, sneezing, trouble swallowing and dental problem.   Respiratory: Positive for cough and shortness of breath.   Cardiovascular: Negative for chest pain, palpitations and leg swelling.  Gastrointestinal: Negative for abdominal pain.  Musculoskeletal: Negative for joint swelling.  Skin: Negative for rash.  Neurological: Negative for headaches.  Psychiatric/Behavioral: Negative for dysphoric mood. The patient is not nervous/anxious.        Objective:   Physical Exam        Assessment & Plan:

## 2012-02-17 ENCOUNTER — Telehealth: Payer: Self-pay | Admitting: Pulmonary Disease

## 2012-02-17 ENCOUNTER — Encounter: Payer: Self-pay | Admitting: Pulmonary Disease

## 2012-02-17 NOTE — Telephone Encounter (Signed)
Spoke with Dr. Glennie Hawk with ophthalmology.  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  He has also been treating her for glaucoma.  He is concerned that systemic steroids could make her vision difficulties worse.  He has recommended using methotrexate if appropriate.  I have relayed this information to [name].  She is scheduled to have evaluation with Dr. Lendon Colonel with Rheumatology in one week.  I have advised that we can defer decision for bronchoscopy and lung tissue sampling until she is evaluated by Dr. Lendon Colonel.

## 2012-02-23 ENCOUNTER — Ambulatory Visit: Payer: PRIVATE HEALTH INSURANCE | Admitting: Internal Medicine

## 2012-02-24 ENCOUNTER — Encounter (HOSPITAL_COMMUNITY): Payer: Self-pay

## 2012-02-24 ENCOUNTER — Ambulatory Visit (HOSPITAL_COMMUNITY)
Admission: RE | Admit: 2012-02-24 | Discharge: 2012-02-24 | Disposition: A | Discharge status: Home / Self Care | Payer: Medicare Other | Source: Ambulatory Visit | Attending: Internal Medicine | Admitting: Internal Medicine

## 2012-02-24 VITALS — BP 102/70 | HR 72 | Ht 63.0 in | Wt 189.8 lb

## 2012-02-24 DIAGNOSIS — I5022 Chronic systolic (congestive) heart failure: Secondary | ICD-10-CM | POA: Insufficient documentation

## 2012-02-24 NOTE — Progress Notes (Signed)
PCP: Dr. Katrinka Blazing Rheumatolgy: Dr Orlin Hilding  EP: Dr Graciela Husbands Nephrologist: Dr Lowell Guitar Pulmonologist: Dr. Craige Cotta  HPI:  [name] is a 66 year old female with a history of nonischemic cardiomyopathy secondary to cardiac sarcoid, status post pacemaker implantation for complete heart block, back pain, CKD Stage III (creatinine baseline 1.5), diverticulitis, and difficulty voiding. She has been in remission since 1984. Biopsy heart and lung preformed at [facility]. 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 Glennie Hawk.    11/2004 LHC: luminal irregularities, no obstructive CAD. Last echocardiogram 1/11: Moderate LVH, EF 35-40%, grade 1 diastolic dysfunction, mild biatrial enlargement.  10/12 Myoview EF 37 %  D/C [facility] 12/01/11 biopsy of kidney, no granulomatous disease but did show mod to severe arteriosclerosis and mild tubulointerstitial scarring. Followed by Dr. Lowell Guitar.  CPX 01/05/12: peak VO2 13.1 (% pred 71.4%), VE/VCO2 slope 24.5, Peak RER 1.15, Vent Threshold 9.6% (% pred 52.3%), VE/MVV 90%, FVC 1.48 (47% pred), FEV1 1.01 (42%), FEV1/FVC 69%, MVV 39 (44%) - felt to represent mild to moderate limitation primarily due to lung disease   Echo 01/05/12: LVEF 35-40%, LV with decreased motion of the septum with dyssynergy and decreased motion of the anterior wall. RV mildly dilated with mildly reduced systolic function. RA mildly dilated. Trivial pericardial effusion. (findings similar to echo in 2011)  01/19/12: CT Chest w/o: Mediastinal and suspected bihilar adenopathy with a perilymphatic distribution of nodularity in the lungs bilaterally  PFT 02/13/12>>FEV1 1.03 (49%), FEV1% 76, TLC 3.30 (71%), DLCO 49%, positive BD response. 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  She returns for follow up today.  Feels ok today.  Has fatigue but this has improved over the last month.  Breathing is at baseline but increased wheezing.  Having flare ups with  her eyes due to iritis, has not started methotrexate.  They are hesitant to give systemic steroids as it may hurt her vision. Taking lasix 3 times a week due to increased SOB.  Weights at home range 185-189 pounds.  No edema.  Chronic 3 pillow orthopnea, no PND.  No chest pain.  No dizziness/syncope.   Dr. Craige Cotta would like to consider bronchoscopy and lung tissue sampling but will discuss with Dr. Lendon Colonel.  Followed up with Dr. Lendon Colonel yesterday.  They feel sarcoid may not be in remission any longer.      Retired. Lives alone.  Review of Systems: All pertinent positives and negatives as in HPI, otherwise negative    Past Medical History  Diagnosis Date  . NICM (nonischemic cardiomyopathy)     echo 1/11: EF 35-40%, mod LVH, Grade 1 diast dysfxn, mild BAE;    cath 2/06: luminal irregs;  Myoview 10/2:  no scar or ischemia, EF 37%   . Pacemaker     pacesetter. 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 Chronic systolic heart failure   . HTN (hypertension)   . HLD (hyperlipidemia)   . Sarcoidosis   . Complete heart block   . Atrial tachycardia   . Diverticulosis   . CKD (chronic kidney disease)   . Iritis   . Glaucoma     Current Outpatient Prescriptions  Medication Sig Dispense Refill  . aspirin 81 MG tablet Take 81 mg by mouth daily.        . bimatoprost (LUMIGAN) 0.01 % SOLN Place 1 drop into the right eye at bedtime.       . carvedilol (COREG) 25 MG tablet Take  0.5 tablets (12.5 mg total) by mouth 2 (two) times daily.      . famotidine (PEPCID) 20 MG tablet Take 20 mg by mouth 2 (two) times daily.      . furosemide (LASIX) 20 MG tablet Take 20 mg by mouth daily as needed. For excess water retention      . losartan (COZAAR) 25 MG tablet Take 1 tablet (25 mg total) by mouth daily.  30 tablet  6  . loteprednol (LOTEMAX) 0.5 % ophthalmic suspension Place 1 drop into both eyes 3 (three) times daily.        . methylcellulose (ARTIFICIAL TEARS) 1 % ophthalmic solution Place 1  drop into both eyes as needed. For dry eyes      . nitroGLYCERIN (NITROSTAT) 0.4 MG SL tablet Place 0.4 mg under the tongue every 5 (five) minutes as needed. For chest pain      . OMEGA-3 KRILL OIL 300 MG CAPS Take 1 capsule by mouth daily.      . rosuvastatin (CRESTOR) 10 MG tablet Take 10 mg by mouth daily.        Marland Kitchen spironolactone (ALDACTONE) 25 MG tablet Take 12.5 mg by mouth every other day.         Allergies  Allergen Reactions  . Codeine Itching  . Penicillins Itching     PHYSICAL EXAM: Filed Vitals:   02/24/12 0917  BP: 102/70  Pulse: 72  Height: 5\' 3"  (1.6 m)  Weight: 189 lb 12.8 oz (86.093 kg)   General:  Well appearing. No respiratory difficulty HEENT: normal Neck: supple. JVP flat. Carotids 2+ bilat; no bruits. No lymphadenopathy or thryomegaly appreciated. Cor: PMI nondisplaced. Regular rate & rhythm. No rubs, gallops or murmurs. Lungs: Coarse throughout Abdomen: obese, soft, nontender, distended. No hepatosplenomegaly. No bruits or masses. Good bowel sounds. Extremities: no cyanosis, clubbing, rash, edema Neuro: alert & oriented x 3, cranial nerves grossly intact. moves all 4 extremities w/o difficulty. Affect pleasant.   ASSESSMENT & PLAN:

## 2012-02-24 NOTE — Patient Instructions (Signed)
Follow up 3 months

## 2012-02-24 NOTE — Assessment & Plan Note (Addendum)
Volume status looks good today.  Main issue appears to be possible active sarcoid.  She is currently being followed by Dr. Craige Cotta and Dr. Lendon Colonel for her sarcoid and possible biopsy.  Will continue sliding scale lasix, no med titration at this time due to soft BP.    Patient seen and examined with Ulyess Blossom, PA-C. We discussed all aspects of the encounter. I agree with the assessment and plan as stated above. Ongoing work-up for likely recurrent sarcoid reviewed and discussed with her. Volume status looks great. She is using sliding scale lasix well. BP too low to push HF medicines at this point and this does not seem to be her major issue currently. Will see her back in 3 months.

## 2012-02-26 ENCOUNTER — Encounter: Payer: Self-pay | Admitting: Internal Medicine

## 2012-02-26 ENCOUNTER — Ambulatory Visit (INDEPENDENT_AMBULATORY_CARE_PROVIDER_SITE_OTHER): Payer: Medicare Other | Admitting: Internal Medicine

## 2012-02-26 VITALS — BP 108/70 | HR 72 | Ht 63.0 in | Wt 190.0 lb

## 2012-02-26 DIAGNOSIS — Z950 Presence of cardiac pacemaker: Secondary | ICD-10-CM

## 2012-02-26 DIAGNOSIS — I495 Sick sinus syndrome: Secondary | ICD-10-CM

## 2012-02-26 DIAGNOSIS — I442 Atrioventricular block, complete: Secondary | ICD-10-CM

## 2012-02-26 LAB — PACEMAKER DEVICE OBSERVATION
BATTERY VOLTAGE: 2.75 V
RV LEAD THRESHOLD: 1.25 V
VENTRICULAR PACING PM: 100

## 2012-02-26 NOTE — Assessment & Plan Note (Signed)
The patient's device was interrogated.  The information was reviewed. No changes were made in the programming.    

## 2012-02-26 NOTE — Patient Instructions (Signed)
Your physician wants you to follow-up in: 6 months with [name]/[name] for a device check. You will receive a reminder letter in the mail two months in advance. If you don't receive a letter, please call our office to schedule the follow-up appointment.  Your physician recommends that you continue on your current medications as directed. Please refer to the Current Medication list given to you today.  

## 2012-02-26 NOTE — Progress Notes (Signed)
HPI  [name] is a 66 y.o. 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     Myoview scanning demonstrated ejection fraction of 37% with no scar. This was consistent with prior testing. Echo April 2013 EF 35-40%.  There has been an issue of ongoing concern related to dyspnea and the possibility that this represents progressive sarcoid. There issues of iritis and she is to see Dr. Lendon Colonel regarding therapy for this as well as bronchoscopy   From a cardiac point of view she think she is doing pretty well she is also been followed by the heart failure clinic    Past Medical History  Diagnosis Date  . NICM (nonischemic cardiomyopathy)     echo 1/11: EF 35-40%, mod LVH, Grade 1 diast dysfxn, mild BAE;    cath 2/06: luminal irregs;  Myoview 10/2:  no scar or ischemia, EF 37%   . Pacemaker     pacesetter. 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 Chronic systolic heart failure   . HTN (hypertension)   . HLD (hyperlipidemia)   . Sarcoidosis   . Complete heart block   . Atrial tachycardia   . Diverticulosis   . CKD (chronic kidney disease)   . Iritis   . Glaucoma     Past Surgical History  Procedure Date  . Av node ablation   . Status post dual chamber pacer     St. Jude model #5380, Identify  . Abdominal hysterectomy   . Tubal ligation     Current Outpatient Prescriptions  Medication Sig Dispense Refill  . aspirin 81 MG tablet Take 81 mg by mouth daily.        . bimatoprost (LUMIGAN) 0.01 % SOLN Place 1 drop into the right eye at bedtime.       . carvedilol (COREG) 25 MG tablet Take 0.5 tablets (12.5 mg total) by mouth 2 (two) times daily.      . famotidine (PEPCID) 20 MG tablet Take 20 mg by mouth 2 (two) times daily.      . furosemide (LASIX) 20 MG tablet Take 20 mg by mouth daily as needed. For excess water retention      . losartan (COZAAR) 25 MG tablet Take 1 tablet (25  mg total) by mouth daily.  30 tablet  6  . loteprednol (LOTEMAX) 0.5 % ophthalmic suspension Place 1 drop into both eyes 3 (three) times daily.        . methylcellulose (ARTIFICIAL TEARS) 1 % ophthalmic solution Place 1 drop into both eyes as needed. For dry eyes      . nitroGLYCERIN (NITROSTAT) 0.4 MG SL tablet Place 0.4 mg under the tongue every 5 (five) minutes as needed. For chest pain      . OMEGA-3 KRILL OIL 300 MG CAPS Take 1 capsule by mouth daily.      . rosuvastatin (CRESTOR) 10 MG tablet Take 10 mg by mouth daily.        Marland Kitchen spironolactone (ALDACTONE) 25 MG tablet Take 12.5 mg by mouth every other day.        Allergies  Allergen Reactions  . Codeine Itching  . Penicillins Itching    Review of Systems negative except from HPI and PMH  Physical Exam There were no vitals taken for this visit. Well developed and well nourished in no acute distress HENT normal E scleral and icterus clear Neck Supple  Clear to ausculation Regular rate and rhythm, no murmurs gallops or rub Soft with active bowel sounds No clubbing cyanosis none Edema Alert and oriented, grossly normal motor and sensory function Skin Warm and Dry    Assessment and  Plan

## 2012-02-26 NOTE — Assessment & Plan Note (Signed)
Device dependent and stable 

## 2012-02-26 NOTE — Assessment & Plan Note (Signed)
She is currently 100% atrially paced with good heart rate exertion

## 2012-03-04 ENCOUNTER — Other Ambulatory Visit: Payer: Self-pay

## 2012-03-04 DIAGNOSIS — I5022 Chronic systolic (congestive) heart failure: Secondary | ICD-10-CM

## 2012-03-04 MED ORDER — LOSARTAN POTASSIUM 25 MG PO TABS: 25.0000 mg | ORAL_TABLET | Freq: Every day | ORAL | Status: DC

## 2012-03-22 ENCOUNTER — Encounter: Payer: Self-pay | Admitting: Pulmonary Disease

## 2012-03-22 ENCOUNTER — Ambulatory Visit (INDEPENDENT_AMBULATORY_CARE_PROVIDER_SITE_OTHER): Payer: Medicare Other | Admitting: Pulmonary Disease

## 2012-03-22 VITALS — BP 116/74 | HR 70 | Temp 99.3°F | Ht 63.0 in | Wt 194.6 lb

## 2012-03-22 DIAGNOSIS — R0989 Other specified symptoms and signs involving the circulatory and respiratory systems: Secondary | ICD-10-CM

## 2012-03-22 DIAGNOSIS — D869 Sarcoidosis, unspecified: Secondary | ICD-10-CM

## 2012-03-22 DIAGNOSIS — R0600 Dyspnea, unspecified: Secondary | ICD-10-CM

## 2012-03-22 NOTE — Progress Notes (Signed)
Chief Complaint  Patient presents with  . Follow-up    Breathing has been doing good. has very little cough. denies any wheezing, chest tx.. Pt saw Dr. Lendon Colonel last thrusday--pt on prednisone   CC: Dr. Glennie Hawk, Orlin Hilding, Casimiro Needle, Berton Mount, Daniel Bensimhon  History of Present Illness: [name] is a 66 y.o. 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  She was started on prednisone by rheumatology.  She is currently on 20 mg per day.  Her breathing and cough have improved.  She denies fever, sputum, or skin rash.  She feels her activity level is better.  She is concerned about weight gain from prednisone.   Past Medical History  Diagnosis Date  . NICM (nonischemic cardiomyopathy)     echo 1/11: EF 35-40%, mod LVH, Grade 1 diast dysfxn, mild BAE;    cath 2/06: luminal irregs;  Myoview 10/2:  no scar or ischemia, EF 37%   . Pacemaker     pacesetter. 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 Chronic systolic heart failure   . HTN (hypertension)   . HLD (hyperlipidemia)   . Sarcoidosis   . Complete heart block   . Atrial tachycardia   . Diverticulosis   . CKD (chronic kidney disease)   . Iritis   . Glaucoma     Past Surgical History  Procedure Date  . Av node ablation   . Status post dual chamber pacer     St. Jude model #5380, Identify  . Abdominal hysterectomy   . Tubal ligation     Current Outpatient Prescriptions on File Prior to Visit  Medication Sig Dispense Refill  . aspirin 81 MG tablet Take 81 mg by mouth daily.        . bimatoprost (LUMIGAN) 0.01 % SOLN Place 1 drop into the right eye at bedtime.       . carvedilol (COREG) 25 MG tablet Take 0.5 tablets (12.5 mg total) by mouth 2 (two) times daily.      . famotidine (PEPCID) 20 MG tablet Take 20 mg by mouth 2 (two) times daily.      . furosemide (LASIX) 20 MG tablet Take 20 mg by mouth daily as needed. For excess water retention      . losartan (COZAAR) 25 MG  tablet Take 1 tablet (25 mg total) by mouth daily.  30 tablet  6  . loteprednol (LOTEMAX) 0.5 % ophthalmic suspension Place 1 drop into both eyes 3 (three) times daily.        . methylcellulose (ARTIFICIAL TEARS) 1 % ophthalmic solution Place 1 drop into both eyes as needed. For dry eyes      . nitroGLYCERIN (NITROSTAT) 0.4 MG SL tablet Place 0.4 mg under the tongue every 5 (five) minutes as needed. For chest pain      . OMEGA-3 KRILL OIL 300 MG CAPS Take 1 capsule by mouth daily.      . rosuvastatin (CRESTOR) 10 MG tablet Take 10 mg by mouth daily.        Marland Kitchen spironolactone (ALDACTONE) 25 MG tablet Take 12.5 mg by mouth every other day.        Allergies  Allergen Reactions  . Codeine Itching  . Penicillins Itching     reports that she quit smoking about 23 years ago. Her smoking use included Cigarettes. She has a 4 pack-year smoking history. She does not have any smokeless tobacco history on file. She reports that she does not  drink alcohol or use illicit drugs.   Physical Exam: BP 116/74  Pulse 70  Temp 99.3 F (37.4 C) (Oral)  Ht 5\' 3"  (1.6 m)  Wt 194 lb 9.6 oz (88.27 kg)  BMI 34.47 kg/m2  SpO2 98%  Body mass index is 34.47 kg/(m^2).   General - Obese HEENT - No sinus tenderness, no oral exudate, mild fullness of anterior cervical nodes Cardiac - s1s2 no murmur Chest - no wheeze/rales Abdomen - soft, nontender Extremities - no edema Neurologic - normal strength Skin - no rashes Psychiatric - normal mood behavior  Assessment/Plan:  Outpatient Encounter Prescriptions as of 03/22/2012  Medication Sig Dispense Refill  . aspirin 81 MG tablet Take 81 mg by mouth daily.        . bimatoprost (LUMIGAN) 0.01 % SOLN Place 1 drop into the right eye at bedtime.       . carvedilol (COREG) 25 MG tablet Take 0.5 tablets (12.5 mg total) by mouth 2 (two) times daily.      . famotidine (PEPCID) 20 MG tablet Take 20 mg by mouth 2 (two) times daily.      . furosemide (LASIX) 20 MG tablet  Take 20 mg by mouth daily as needed. For excess water retention      . losartan (COZAAR) 25 MG tablet Take 1 tablet (25 mg total) by mouth daily.  30 tablet  6  . loteprednol (LOTEMAX) 0.5 % ophthalmic suspension Place 1 drop into both eyes 3 (three) times daily.        . methylcellulose (ARTIFICIAL TEARS) 1 % ophthalmic solution Place 1 drop into both eyes as needed. For dry eyes      . nitroGLYCERIN (NITROSTAT) 0.4 MG SL tablet Place 0.4 mg under the tongue every 5 (five) minutes as needed. For chest pain      . NON FORMULARY vitamin for hair, skin nails 3 times a day      . OMEGA-3 KRILL OIL 300 MG CAPS Take 1 capsule by mouth daily.      . predniSONE (DELTASONE) 20 MG tablet Take 20 mg by mouth daily.      . rosuvastatin (CRESTOR) 10 MG tablet Take 10 mg by mouth daily.        Marland Kitchen spironolactone (ALDACTONE) 25 MG tablet Take 12.5 mg by mouth every other day.        [name] Pager:  [phone] 03/22/2012, 3:26 PM

## 2012-03-22 NOTE — Assessment & Plan Note (Signed)
Improved after starting prednisone.

## 2012-03-22 NOTE — Patient Instructions (Signed)
Follow up in 4 months 

## 2012-03-22 NOTE — Assessment & Plan Note (Signed)
She is to continue prednisone with eventual transition to methotrexate per rheumatology.  Will follow up in 4 months, and then determine timing of repeat CT chest and PFT's.

## 2012-03-23 ENCOUNTER — Other Ambulatory Visit: Payer: Self-pay | Admitting: *Deleted

## 2012-03-23 DIAGNOSIS — I5022 Chronic systolic (congestive) heart failure: Secondary | ICD-10-CM

## 2012-03-23 MED ORDER — CARVEDILOL 25 MG PO TABS: 12.5000 mg | ORAL_TABLET | Freq: Two times a day (BID) | ORAL | Status: DC

## 2012-03-30 ENCOUNTER — Other Ambulatory Visit: Payer: Self-pay | Admitting: Physician Assistant

## 2012-04-05 ENCOUNTER — Other Ambulatory Visit: Payer: Self-pay | Admitting: Cardiovascular Disease

## 2012-04-29 ENCOUNTER — Encounter: Payer: Self-pay | Admitting: Pulmonary Disease

## 2012-07-28 ENCOUNTER — Other Ambulatory Visit: Payer: Self-pay | Admitting: Internal Medicine

## 2012-08-04 ENCOUNTER — Ambulatory Visit (INDEPENDENT_AMBULATORY_CARE_PROVIDER_SITE_OTHER): Payer: Medicare Other | Admitting: Pulmonary Disease

## 2012-08-04 ENCOUNTER — Encounter: Payer: Self-pay | Admitting: Pulmonary Disease

## 2012-08-04 VITALS — BP 116/84 | HR 88 | Temp 97.9°F | Ht 63.0 in | Wt 204.2 lb

## 2012-08-04 DIAGNOSIS — D869 Sarcoidosis, unspecified: Secondary | ICD-10-CM

## 2012-08-04 DIAGNOSIS — R0609 Other forms of dyspnea: Secondary | ICD-10-CM

## 2012-08-04 DIAGNOSIS — R0600 Dyspnea, unspecified: Secondary | ICD-10-CM

## 2012-08-04 DIAGNOSIS — R0989 Other specified symptoms and signs involving the circulatory and respiratory systems: Secondary | ICD-10-CM

## 2012-08-04 MED ORDER — ALBUTEROL SULFATE HFA 108 (90 BASE) MCG/ACT IN AERS: 2.0000 | INHALATION_SPRAY | Freq: Four times a day (QID) | RESPIRATORY_TRACT | Status: DC | PRN

## 2012-08-04 NOTE — Patient Instructions (Signed)
Will schedule CT chest Will arrange for Breathing test (PFT) Proair two puffs up to four times per day as needed for cough, wheeze, or chest congestion Will get copy of lab tests from Dr. Katrinka Blazing and Dr. Lendon Colonel Follow up in one month

## 2012-08-04 NOTE — Assessment & Plan Note (Signed)
She reports progressive cough, wheeze, and dyspnea.  Will repeat CT chest with high resolution cuts.  Will arrange for repeat PFT.  Will give empiric trial of proair.

## 2012-08-04 NOTE — Assessment & Plan Note (Signed)
She is on MTX and prednisone therapy per rheumatology.  Will get copies of her recent lab work.

## 2012-08-04 NOTE — Progress Notes (Signed)
Chief Complaint  Patient presents with  . Follow-up    breathing is worse since last OV, wheezing, chest tx, dry cough   CC: Dr. Glennie Hawk, Orlin Hilding, Casimiro Needle, Berton Mount, Daniel Bensimhon  History of Present Illness: [name] is a 66 y.o. 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  She has noticed more cough, and wheeze.  She has been getting more short of breath with activity.  She can only walk about 1/2 block.  She is getting burning feeling in her hands.  She denies sputum or hemoptysis.  She has not had fever, or sweats.    Tests: Echo 01/05/12>>EF 35 to 40% PFT 02/13/12>>FEV1 1.03 (49%), FEV1% 76, TLC 3.30 (71%), DLCO 49%, positive BD response.  CT chest 01/21/12>>b/l hilar and mediastinal nodes, diffuse peribronchovascular and perifissural nodularity with mild associated septal thickening. 02/16/12 PPD negative  04/28/12 Start MTX  Past Medical History  Diagnosis Date  . NICM (nonischemic cardiomyopathy)     echo 1/11: EF 35-40%, mod LVH, Grade 1 diast dysfxn, mild BAE;    cath 2/06: luminal irregs;  Myoview 10/2:  no scar or ischemia, EF 37%   . Pacemaker     pacesetter. 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 Chronic systolic heart failure   . HTN (hypertension)   . HLD (hyperlipidemia)   . Sarcoidosis   . Complete heart block   . Atrial tachycardia   . Diverticulosis   . CKD (chronic kidney disease)   . Iritis   . Glaucoma(365)     Past Surgical History  Procedure Date  . Av node ablation   . Status post dual chamber pacer     St. Jude model #5380, Identify  . Abdominal hysterectomy   . Tubal ligation     Current Outpatient Prescriptions on File Prior to Visit  Medication Sig Dispense Refill  . aspirin 81 MG tablet Take 81 mg by mouth daily.        . bimatoprost (LUMIGAN) 0.01 % SOLN Place 1 drop into the right eye at bedtime.       . carvedilol (COREG) 25 MG tablet Take 0.5 tablets (12.5 mg total) by  mouth 2 (two) times daily.  30 tablet  5  . CRESTOR 10 MG tablet TAKE 1 TABLET BY MOUTH ONCE DAILY  30 tablet  0  . famotidine (PEPCID) 20 MG tablet TAKE ONE TABLET BY MOUTH TWICE DAILY  60 tablet  6  . furosemide (LASIX) 20 MG tablet Take 20 mg by mouth daily as needed. For excess water retention      . losartan (COZAAR) 25 MG tablet Take 1 tablet (25 mg total) by mouth daily.  30 tablet  6  . loteprednol (LOTEMAX) 0.5 % ophthalmic suspension Place 1 drop into both eyes 3 (three) times daily.        . methylcellulose (ARTIFICIAL TEARS) 1 % ophthalmic solution Place 1 drop into both eyes as needed. For dry eyes      . nitroGLYCERIN (NITROSTAT) 0.4 MG SL tablet Place 0.4 mg under the tongue every 5 (five) minutes as needed. For chest pain      . spironolactone (ALDACTONE) 25 MG tablet Take 12.5 mg by mouth every other day.      Marland Kitchen DISCONTD: famotidine (PEPCID) 20 MG tablet Take 20 mg by mouth 2 (two) times daily.        Allergies  Allergen Reactions  . Codeine Itching  . Penicillins Itching  Physical Exam: Filed Vitals:   08/04/12 0921  BP: 116/84  Pulse: 88  Temp: 97.9 F (36.6 C)  TempSrc: Oral  Height: 5\' 3"  (1.6 m)  Weight: 204 lb 3.2 oz (92.625 kg)  SpO2: 96%    Wt Readings from Last 3 Encounters:  08/04/12 204 lb 3.2 oz (92.625 kg)  03/22/12 194 lb 9.6 oz (88.27 kg)  02/26/12 190 lb (86.183 kg)    Body mass index is 36.17 kg/(m^2).   General - Obese HEENT - No sinus tenderness, no oral exudate, moon facies Cardiac - s1s2 no murmur Chest - no wheeze/rales Abdomen - soft, nontender Extremities - no edema Neurologic - normal strength Skin - no rashes Psychiatric - normal mood behavior  Assessment/Plan:  Outpatient Encounter Prescriptions as of 08/04/2012  Medication Sig Dispense Refill  . aspirin 81 MG tablet Take 81 mg by mouth daily.        . bimatoprost (LUMIGAN) 0.01 % SOLN Place 1 drop into the right eye at bedtime.       . carvedilol (COREG) 25 MG  tablet Take 0.5 tablets (12.5 mg total) by mouth 2 (two) times daily.  30 tablet  5  . CRESTOR 10 MG tablet TAKE 1 TABLET BY MOUTH ONCE DAILY  30 tablet  0  . famotidine (PEPCID) 20 MG tablet TAKE ONE TABLET BY MOUTH TWICE DAILY  60 tablet  6  . fish oil-omega-3 fatty acids 1000 MG capsule Take 2 g by mouth daily.      . folic acid (FOLVITE) 1 MG tablet Take 1 mg by mouth daily.      . furosemide (LASIX) 20 MG tablet Take 20 mg by mouth daily as needed. For excess water retention      . losartan (COZAAR) 25 MG tablet Take 1 tablet (25 mg total) by mouth daily.  30 tablet  6  . loteprednol (LOTEMAX) 0.5 % ophthalmic suspension Place 1 drop into both eyes 3 (three) times daily.        . methotrexate (RHEUMATREX) 2.5 MG tablet 6 TABS ONCE A WEEK      . methylcellulose (ARTIFICIAL TEARS) 1 % ophthalmic solution Place 1 drop into both eyes as needed. For dry eyes      . nitroGLYCERIN (NITROSTAT) 0.4 MG SL tablet Place 0.4 mg under the tongue every 5 (five) minutes as needed. For chest pain      . prednisoLONE 5 MG TABS Take 7.5 mg by mouth daily.      Marland Kitchen spironolactone (ALDACTONE) 25 MG tablet Take 12.5 mg by mouth every other day.      Marland Kitchen DISCONTD: famotidine (PEPCID) 20 MG tablet Take 20 mg by mouth 2 (two) times daily.      Marland Kitchen DISCONTD: NON FORMULARY vitamin for hair, skin nails 3 times a day      . DISCONTD: OMEGA-3 KRILL OIL 300 MG CAPS Take 1 capsule by mouth daily.      Marland Kitchen DISCONTD: predniSONE (DELTASONE) 20 MG tablet Take 20 mg by mouth daily.        Tre Sanker Pager:  [phone] 08/04/2012, 9:24 AM

## 2012-08-06 ENCOUNTER — Ambulatory Visit (INDEPENDENT_AMBULATORY_CARE_PROVIDER_SITE_OTHER)
Admission: RE | Admit: 2012-08-06 | Discharge: 2012-08-06 | Disposition: A | Discharge status: Home / Self Care | Payer: Medicare Other | Source: Ambulatory Visit | Attending: Pulmonary Disease | Admitting: Pulmonary Disease

## 2012-08-06 DIAGNOSIS — D869 Sarcoidosis, unspecified: Secondary | ICD-10-CM

## 2012-08-10 ENCOUNTER — Ambulatory Visit (INDEPENDENT_AMBULATORY_CARE_PROVIDER_SITE_OTHER): Payer: Medicare Other | Admitting: Pulmonary Disease

## 2012-08-10 ENCOUNTER — Telehealth: Payer: Self-pay | Admitting: Pulmonary Disease

## 2012-08-10 DIAGNOSIS — D869 Sarcoidosis, unspecified: Secondary | ICD-10-CM

## 2012-08-10 LAB — PULMONARY FUNCTION TEST

## 2012-08-10 NOTE — Progress Notes (Signed)
PFT done today. 

## 2012-08-10 NOTE — Telephone Encounter (Signed)
Ct Chest Wo Contrast  08/06/2012  *RADIOLOGY REPORT*  Clinical Data: Follow-up sarcoidosis.  CT CHEST WITHOUT CONTRAST  Technique:  Multidetector CT imaging of the chest was performed following the standard protocol without IV contrast. High- resolution imaging was performed with inspiration and expiration examination.  Comparison: 01/21/2012.   Findings: The chest wall is unremarkable and stable.  A permanent left-sided pacemaker is noted.  No supraclavicular or axillary lymphadenopathy.  No breast mass.  The bony thorax is intact.  No destructive bone lesions or spinal canal compromise.  The heart is enlarged but stable.  There is a small pericardial effusion which is unchanged.  There are enlarged mediastinal and hilar lymph nodes consistent with known sarcoidosis.  No calcifications.  The esophagus is grossly normal.  The aorta is normal in caliber.  Minimal atherosclerotic calcifications.  Examination of the lung parenchyma demonstrates stable diffuse peribronchovascular and perifissural airspace nodularity and mild associated septal thickening.  The high-resolution images demonstrate no subpleural reticulation, traction bronchiectasis or honeycombing.  No significant air trapping.  No worrisome pulmonary mass or acute overlying pulmonary process.  The upper abdomen is unremarkable and stable. Stable low attenuation right hepatic lobe liver lesion.   IMPRESSION:  1.  Stable CT appearance of the chest with findings consistent with sarcoidosis. 2.  No acute overlying pulmonary process. 3.  Stable cardiac enlargement and small pericardial effusion.   Original Report Authenticated By: Rudie Meyer, M.D.     Results d/w pt.  Will review PFT's and then decided if additional interventions are needed.  She as not tried using proair yet.

## 2012-08-12 ENCOUNTER — Telehealth: Payer: Self-pay | Admitting: Pulmonary Disease

## 2012-08-12 NOTE — Telephone Encounter (Signed)
PFT 08/10/12>>FEV1 1.08 (52%), FEV1% 70, TLC 2.77 (59%), DLCO 44%, no BD.  Results d/w pt.  Advised her to try using proair as needed.  She continues prednisone and MTX per rheumatology.

## 2012-09-07 ENCOUNTER — Encounter: Payer: Self-pay | Admitting: Pulmonary Disease

## 2012-09-07 ENCOUNTER — Ambulatory Visit (INDEPENDENT_AMBULATORY_CARE_PROVIDER_SITE_OTHER): Payer: Medicare Other | Admitting: Pulmonary Disease

## 2012-09-07 VITALS — BP 110/62 | HR 87 | Temp 98.0°F | Ht 63.0 in | Wt 206.2 lb

## 2012-09-07 DIAGNOSIS — D869 Sarcoidosis, unspecified: Secondary | ICD-10-CM

## 2012-09-07 NOTE — Patient Instructions (Signed)
Follow up in 6 months 

## 2012-09-07 NOTE — Assessment & Plan Note (Signed)
Her recent CT chest and PFT findings are consistent with sarcoidosis.  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  She can continue with proair as needed for now.  She is on prednisone per rheumatology.  She will f/u with rheumatology later this month to discuss alternative to MTX which was stopped due to side effects.

## 2012-09-07 NOTE — Progress Notes (Signed)
Chief Complaint  Patient presents with  . Follow-up    breathing is better. occasional dry cough, wheezing and chest tx w/ activity and sometimes at rest. she is using her proair 1-2 times daily   CC: Dr. Glennie Hawk, Orlin Hilding, Casimiro Needle, Berton Mount, Daniel Bensimhon  History of Present Illness: [name] is a 66 y.o. 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  Her breathing has been doing better.  She does not have as much cough or wheeze.  She uses proair once or twice per day, and this helps.    She had to stop MTX due to itching and burning sensation.  She is due to f/u with rheumatology later this month.  Tests: Echo 01/05/12>>EF 35 to 40% PFT 02/13/12>>FEV1 1.03 (49%), FEV1% 76, TLC 3.30 (71%), DLCO 49%, positive BD response.  CT chest 01/21/12>>b/l hilar and mediastinal nodes, diffuse peribronchovascular and perifissural nodularity with mild associated septal thickening. 02/16/12 PPD negative  04/28/12 Start MTX >> stopped November 2013 due to pruritis CT chest 08/06/12>>mediastinal/hilar LAN, diffuse peribronchovascular and perfissural nodularity PFT 08/10/12>>FEV1 1.08 (52%), FEV1% 70, TLC 2.77 (59%), DLCO 44%, no BD.  Past Medical History  Diagnosis Date  . NICM (nonischemic cardiomyopathy)     echo 1/11: EF 35-40%, mod LVH, Grade 1 diast dysfxn, mild BAE;    cath 2/06: luminal irregs;  Myoview 10/2:  no scar or ischemia, EF 37%   . Pacemaker     pacesetter. 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 Chronic systolic heart failure   . HTN (hypertension)   . HLD (hyperlipidemia)   . Sarcoidosis   . Complete heart block   . Atrial tachycardia   . Diverticulosis   . CKD (chronic kidney disease)   . Iritis   . Glaucoma(365)     Past Surgical History  Procedure Date  . Av node ablation   . Status post dual chamber pacer     St. Jude model #5380, Identify  . Abdominal hysterectomy   . Tubal ligation     Current  Outpatient Prescriptions on File Prior to Visit  Medication Sig Dispense Refill  . albuterol (PROAIR HFA) 108 (90 BASE) MCG/ACT inhaler Inhale 2 puffs into the lungs every 6 (six) hours as needed for wheezing.  1 Inhaler  3  . bimatoprost (LUMIGAN) 0.01 % SOLN Place 1 drop into the right eye at bedtime.       . carvedilol (COREG) 25 MG tablet Take 0.5 tablets (12.5 mg total) by mouth 2 (two) times daily.  30 tablet  5  . CRESTOR 10 MG tablet TAKE 1 TABLET BY MOUTH ONCE DAILY  30 tablet  0  . famotidine (PEPCID) 20 MG tablet TAKE ONE TABLET BY MOUTH TWICE DAILY  60 tablet  6  . fish oil-omega-3 fatty acids 1000 MG capsule Take 2 g by mouth daily.      . furosemide (LASIX) 20 MG tablet Take 20 mg by mouth daily as needed. For excess water retention      . losartan (COZAAR) 25 MG tablet Take 1 tablet (25 mg total) by mouth daily.  30 tablet  6  . loteprednol (LOTEMAX) 0.5 % ophthalmic suspension Place 1 drop into both eyes 3 (three) times daily.        . methylcellulose (ARTIFICIAL TEARS) 1 % ophthalmic solution Place 1 drop into both eyes as needed. For dry eyes      . nitroGLYCERIN (NITROSTAT) 0.4 MG SL tablet Place 0.4  mg under the tongue every 5 (five) minutes as needed. For chest pain      . prednisoLONE 5 MG TABS Take 7.5 mg by mouth daily.      Marland Kitchen spironolactone (ALDACTONE) 25 MG tablet Take 12.5 mg by mouth every other day.        Allergies  Allergen Reactions  . Codeine Itching  . Morphine And Related     Throat closing  . Penicillins Itching     Physical Exam: Filed Vitals:   09/07/12 1417 09/07/12 1418  BP:  110/62  Pulse:  87  Temp: 98 F (36.7 C)   TempSrc: Oral   Height: 5\' 3"  (1.6 m)   Weight: 206 lb 3.2 oz (93.532 kg)   SpO2:  96%    Wt Readings from Last 3 Encounters:  09/07/12 206 lb 3.2 oz (93.532 kg)  08/04/12 204 lb 3.2 oz (92.625 kg)  03/22/12 194 lb 9.6 oz (88.27 kg)    Body mass index is 36.53 kg/(m^2).   General - Obese HEENT - No sinus  tenderness, no oral exudate, moon facies Cardiac - s1s2 no murmur Chest - no wheeze/rales Abdomen - soft, nontender Extremities - no edema Neurologic - normal strength Skin - no rashes Psychiatric - normal mood behavior  Assessment/Plan:  Coralyn Helling, MD [facility] Pulmonary/Critical Care 09/07/2012, 2:46 PM Pager:  [phone] After 3pm call: [phone]

## 2012-09-15 ENCOUNTER — Other Ambulatory Visit: Payer: Self-pay | Admitting: Internal Medicine

## 2012-10-27 ENCOUNTER — Other Ambulatory Visit: Payer: Self-pay

## 2012-10-27 DIAGNOSIS — I5022 Chronic systolic (congestive) heart failure: Secondary | ICD-10-CM

## 2012-10-27 MED ORDER — CARVEDILOL 25 MG PO TABS: 12.5000 mg | ORAL_TABLET | Freq: Two times a day (BID) | ORAL | Status: DC

## 2012-11-02 ENCOUNTER — Encounter: Payer: Self-pay | Admitting: *Deleted

## 2012-11-12 ENCOUNTER — Encounter: Payer: Self-pay | Admitting: Cardiology

## 2012-11-12 ENCOUNTER — Encounter: Payer: Medicare Other | Admitting: Cardiology

## 2012-11-12 ENCOUNTER — Ambulatory Visit (INDEPENDENT_AMBULATORY_CARE_PROVIDER_SITE_OTHER): Payer: Medicare Other | Admitting: Cardiology

## 2012-11-12 DIAGNOSIS — I442 Atrioventricular block, complete: Secondary | ICD-10-CM

## 2012-11-12 DIAGNOSIS — Z950 Presence of cardiac pacemaker: Secondary | ICD-10-CM

## 2012-11-12 LAB — PACEMAKER DEVICE OBSERVATION
ATRIAL PACING PM: 90
BATTERY VOLTAGE: 2.74 V
DEVICE MODEL PM: 1436713
VENTRICULAR PACING PM: 97

## 2012-11-12 NOTE — Progress Notes (Signed)
PPM check/device clinic only. See PaceArt report. 

## 2012-11-17 ENCOUNTER — Other Ambulatory Visit: Payer: Self-pay | Admitting: Internal Medicine

## 2012-12-05 ENCOUNTER — Other Ambulatory Visit: Payer: Self-pay | Admitting: Physician Assistant

## 2012-12-05 ENCOUNTER — Other Ambulatory Visit: Payer: Self-pay | Admitting: Internal Medicine

## 2012-12-15 ENCOUNTER — Encounter: Payer: Self-pay | Admitting: Internal Medicine

## 2012-12-22 IMAGING — CT CT CHEST W/O CM
2 of 5 series · 16 of 31 positions shown, 19 images · non-contrast
Comparison: Prior chest radiographs dating back to 11/28/2005.

CLINICAL DATA: Shortness of breath.  Cough.  History of sarcoid.

CT CHEST WITHOUT CONTRAST
TECHNIQUE: Multidetector CT imaging of the chest was performed
following the standard protocol without IV contrast.

[Series 400: coronals · coronal · 0.66mm/px · 7 of 72 slices shown]
[im 9/72  lung]
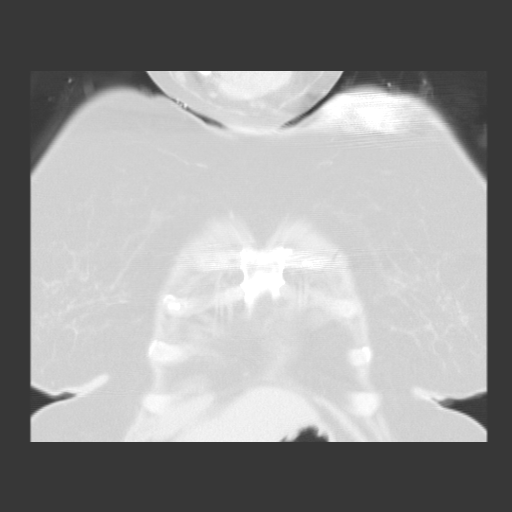
[im 18/72  lung]
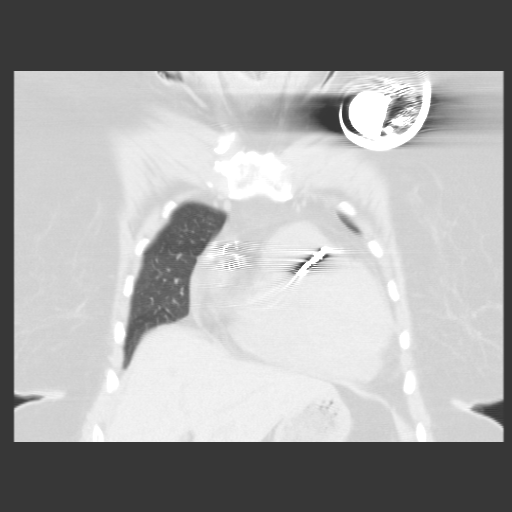
[im 27/72  lung]
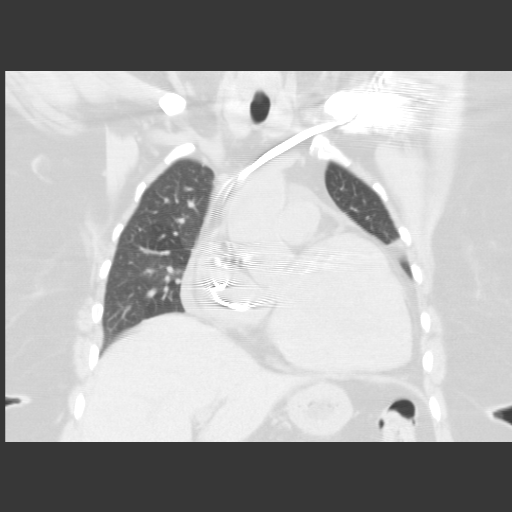
[im 36/72  lung]
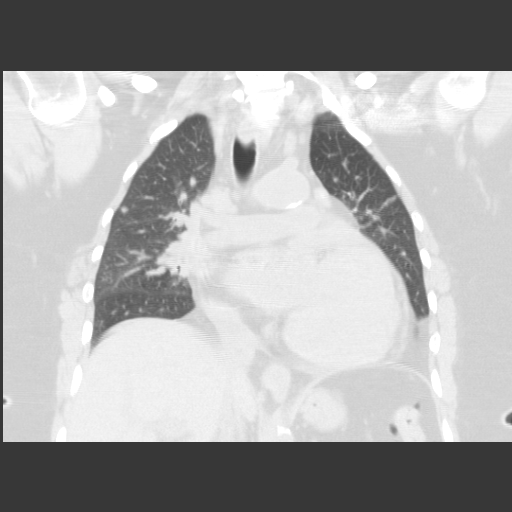
[im 45/72  lung]
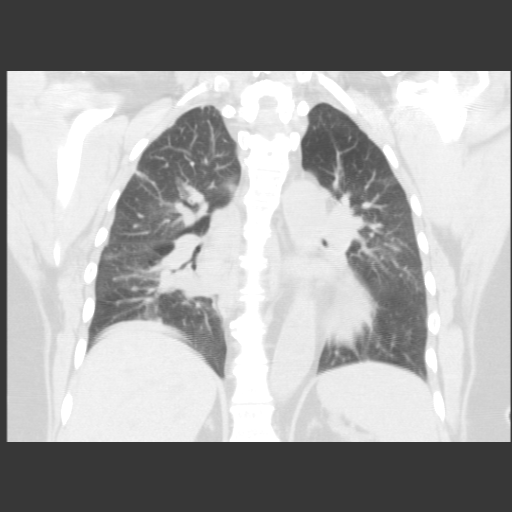
[im 54/72  lung]
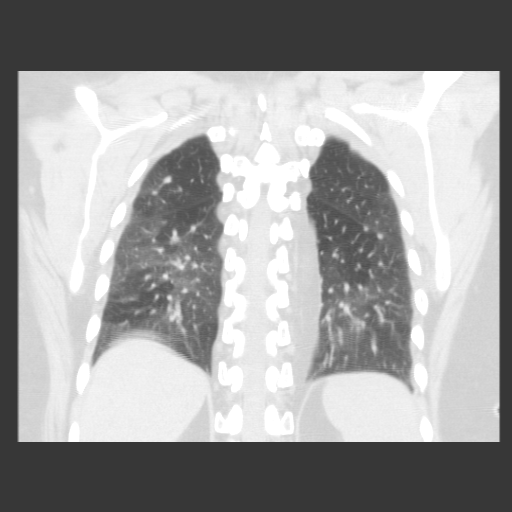
[im 63/72  lung]
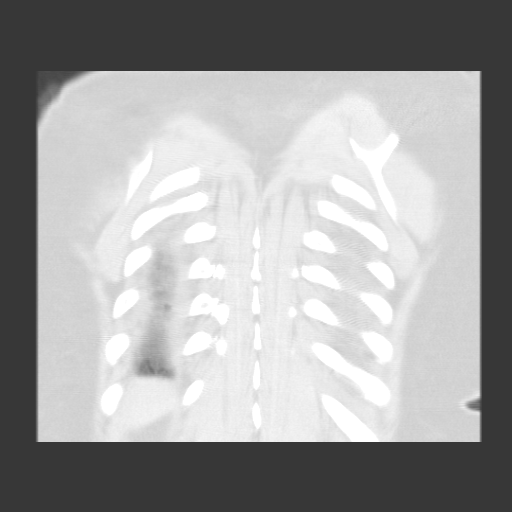

[Series 401: sagittals · sagittal · 0.66mm/px · 9 of 88 slices shown, 12 images]
[im 9/88  mediastinal]
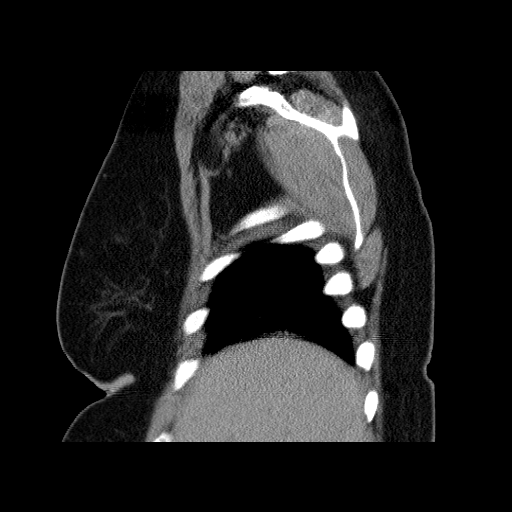
[im 9/88  lung]
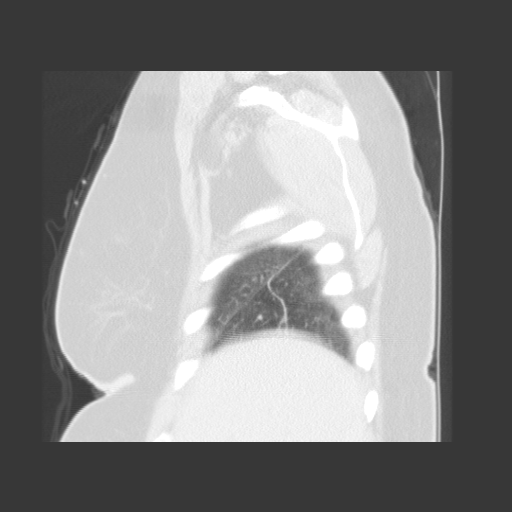
[im 18/88  lung]
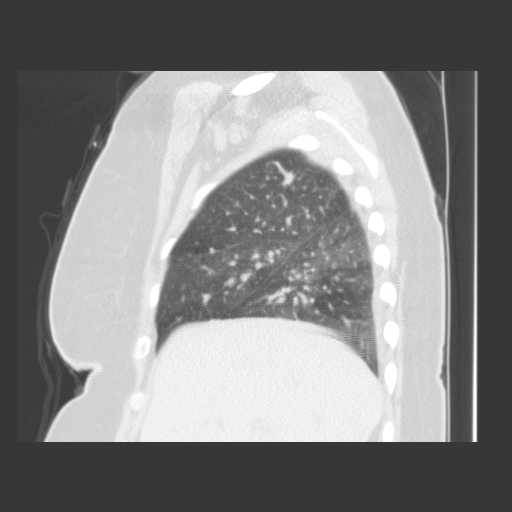
[im 27/88  lung]
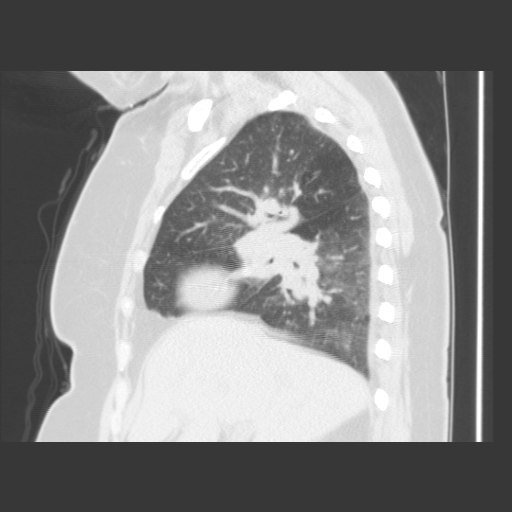
[im 35/88  lung]
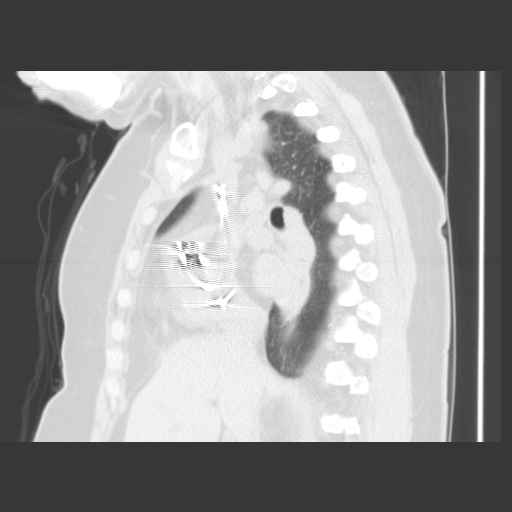
[im 44/88  mediastinal]
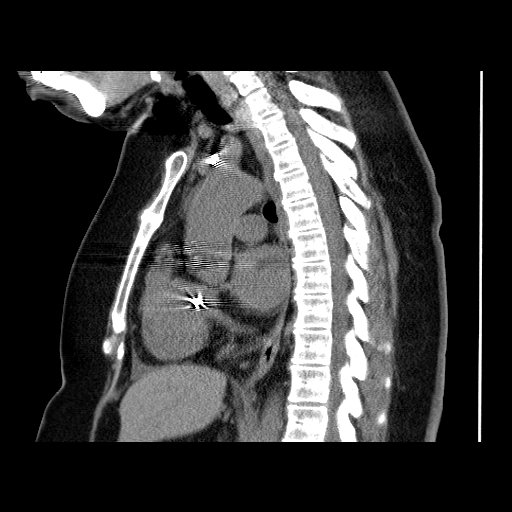
[im 44/88  lung]
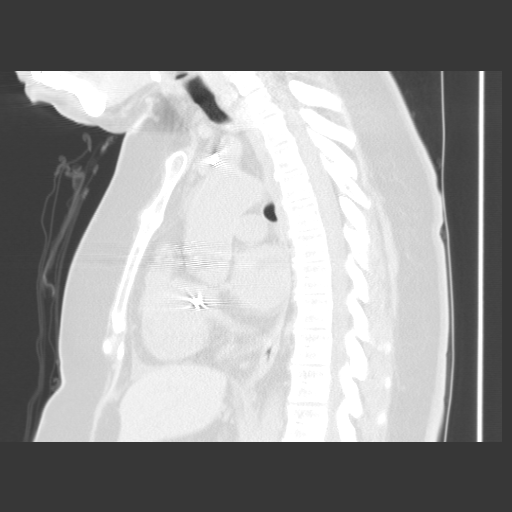
[im 53/88  lung]
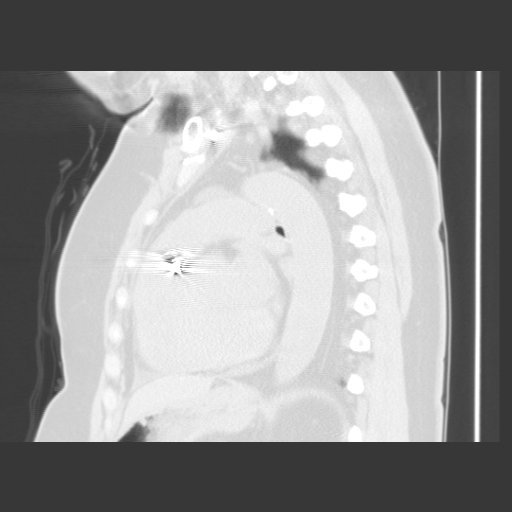
[im 61/88  lung]
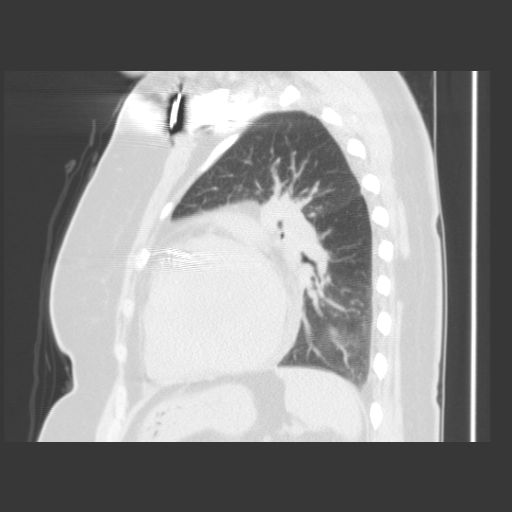
[im 70/88  lung]
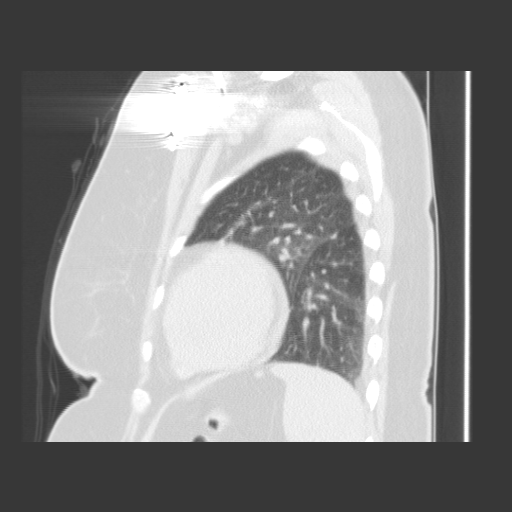
[im 79/88  mediastinal]
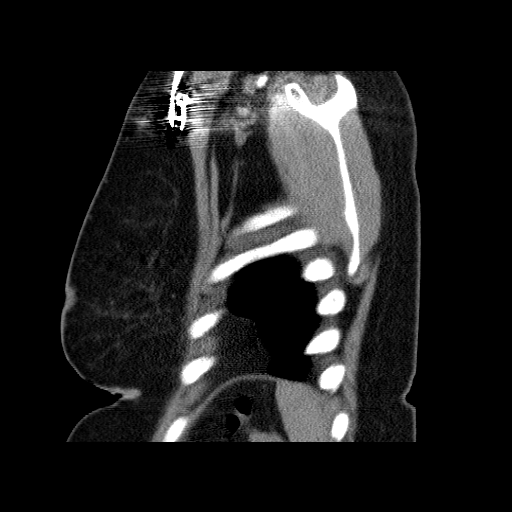
[im 79/88  lung]
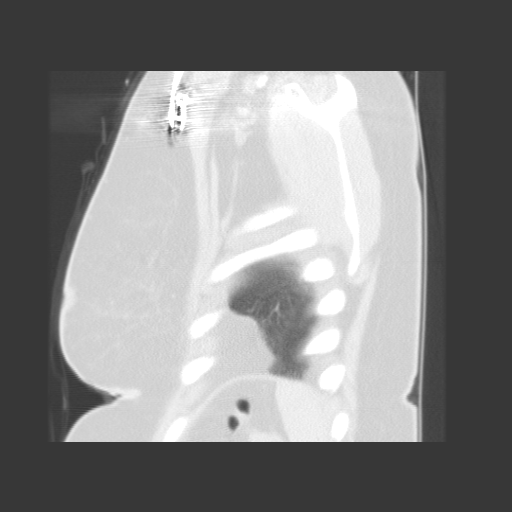

[16 of 31 positions shown; findings below may reference images not displayed]

FINDINGS: Mediastinal lymph nodes measure up to 14 mm in short axis
anterior to the right mainstem bronchus.  Hilar regions are
difficult to definitively evaluate without IV contrast but
adenopathy is suspected bilaterally.  No axillary adenopathy.
Heart is enlarged.  No pericardial effusion.

There is fairly diffuse peribronchovascular and perifissural
nodularity with mild associated septal thickening.  Tiny right
pleural effusion. Airway is unremarkable.  High resolution imaging
shows no subpleural reticulation, traction bronchiectasis or
honeycombing.  No air trapping on inspiratory and expiratory
imaging.

Incidental imaging of the upper abdomen shows low attenuation
lesions in the liver measuring up to 11 mm in the dome.  No
worrisome lytic or sclerotic lesions.
IMPRESSION: 1.  Mediastinal and suspected bihilar adenopathy with a
perilymphatic distribution of nodularity in the lungs bilaterally.
Findings are in keeping with the given history of sarcoid.
2.  Tiny right pleural effusion.

## 2012-12-28 ENCOUNTER — Other Ambulatory Visit: Payer: Self-pay | Admitting: Physician Assistant

## 2013-03-30 ENCOUNTER — Encounter: Payer: Self-pay | Admitting: Pulmonary Disease

## 2013-03-30 ENCOUNTER — Ambulatory Visit (INDEPENDENT_AMBULATORY_CARE_PROVIDER_SITE_OTHER): Payer: Medicaid Other | Admitting: Pulmonary Disease

## 2013-03-30 VITALS — BP 120/76 | HR 73 | Temp 98.5°F | Ht 63.0 in | Wt 197.0 lb

## 2013-03-30 DIAGNOSIS — D869 Sarcoidosis, unspecified: Secondary | ICD-10-CM

## 2013-03-30 DIAGNOSIS — D860 Sarcoidosis of lung: Secondary | ICD-10-CM

## 2013-03-30 DIAGNOSIS — J99 Respiratory disorders in diseases classified elsewhere: Secondary | ICD-10-CM

## 2013-03-30 NOTE — Assessment & Plan Note (Addendum)
She is doing well.  She is to follow up with rheumatology next month.  Will repeat her CT chest w/o contrast and PFT, and call her with results.

## 2013-03-30 NOTE — Patient Instructions (Signed)
Will arrange for CT chest and breathing test (PFT) >> will call with results Follow up in 6 months

## 2013-03-30 NOTE — Addendum Note (Signed)
Addended by: Orma Flaming D on: 03/30/2013 03:41 PM   Modules accepted: Orders

## 2013-03-30 NOTE — Progress Notes (Signed)
Chief Complaint  Patient presents with  . Sarcoidosis    Breathing has improved. Reports coughing from time to time. Denies SOB, chest tightness or wheezing.   CC: Dr. Glennie Hawk, Orlin Hilding, Casimiro Needle, Berton Mount, Daniel Bensimhon  History of Present Illness: [name] is a 67 y.o. 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  Her breathing is doing well.  She is not having skin rash or visual problems.  She denies wheeze, sputum, or chest pain.  She gets occasional cough.  Tests: Echo 01/05/12>>EF 35 to 40% PFT 02/13/12>>FEV1 1.03 (49%), FEV1% 76, TLC 3.30 (71%), DLCO 49%, positive BD response.  CT chest 01/21/12>>b/l hilar and mediastinal nodes, diffuse peribronchovascular and perifissural nodularity with mild associated septal thickening. 02/16/12 PPD negative  04/28/12 Start MTX >> stopped November 2013 due to pruritis CT chest 08/06/12>>mediastinal/hilar LAN, diffuse peribronchovascular and perfissural nodularity PFT 08/10/12>>FEV1 1.08 (52%), FEV1% 70, TLC 2.77 (59%), DLCO 44%, no BD.  She  has a past medical history of NICM (nonischemic cardiomyopathy); Pacemaker; Chronic systolic heart failure; HTN (hypertension); HLD (hyperlipidemia); Sarcoidosis; Complete heart block; Atrial tachycardia; Diverticulosis; CKD (chronic kidney disease); Iritis; and Glaucoma.  She  has past surgical history that includes AV node ablation; status post dual chamber pacer; Abdominal hysterectomy; and Tubal ligation.   Current Outpatient Prescriptions on File Prior to Visit  Medication Sig Dispense Refill  . albuterol (PROAIR HFA) 108 (90 BASE) MCG/ACT inhaler Inhale 2 puffs into the lungs every 6 (six) hours as needed for wheezing.  1 Inhaler  3  . aspirin 81 MG tablet Take 81 mg by mouth daily.      . bimatoprost (LUMIGAN) 0.01 % SOLN Place 1 drop into the right eye at bedtime.       . carvedilol (COREG) 25 MG tablet Take 0.5 tablets (12.5 mg total) by mouth 2 (two)  times daily.  30 tablet  5  . CRESTOR 10 MG tablet TAKE ONE TABLET BY MOUTH ONE TIME DAILY  30 tablet  0  . famotidine (PEPCID) 20 MG tablet TAKE ONE TABLET BY MOUTH TWICE DAILY  60 tablet  3  . furosemide (LASIX) 20 MG tablet TAKE 1 TABLET BY MOUTH DAILY AS NEEDED (TAKE IF WEIGHT INCREASED BY 2 LBS OR MORE, OR IF YOU NOTICE INCREASED SHORTNESS OF BREATH OR EDEMA)  30 tablet  4  . hydroxychloroquine (PLAQUENIL) 200 MG tablet Take 400 mg by mouth daily.      Marland Kitchen loteprednol (LOTEMAX) 0.5 % ophthalmic suspension Place 1 drop into both eyes 3 (three) times daily.        . methylcellulose (ARTIFICIAL TEARS) 1 % ophthalmic solution Place 1 drop into both eyes as needed. For dry eyes      . nitroGLYCERIN (NITROSTAT) 0.4 MG SL tablet Place 0.4 mg under the tongue every 5 (five) minutes as needed. For chest pain       No current facility-administered medications on file prior to visit.    Allergies  Allergen Reactions  . Codeine Itching  . Morphine And Related     Throat closing  . Penicillins Itching     Physical Exam:  General - Obese HEENT - No sinus tenderness, no oral exudate Cardiac - s1s2 no murmur Chest - no wheeze/rales Abdomen - soft, nontender Extremities - no edema Neurologic - normal strength Skin - no rashes Psychiatric - normal mood behavior  Assessment/Plan:  Coralyn Helling, MD [facility] Pulmonary/Critical Care 03/30/2013, 3:25 PM Pager:  [phone] After 3pm call: [phone]

## 2013-04-01 ENCOUNTER — Ambulatory Visit (INDEPENDENT_AMBULATORY_CARE_PROVIDER_SITE_OTHER)
Admission: RE | Admit: 2013-04-01 | Discharge: 2013-04-01 | Disposition: A | Discharge status: Home / Self Care | Payer: Medicare Other | Source: Ambulatory Visit | Attending: Pulmonary Disease | Admitting: Pulmonary Disease

## 2013-04-01 DIAGNOSIS — D869 Sarcoidosis, unspecified: Secondary | ICD-10-CM

## 2013-04-01 DIAGNOSIS — D860 Sarcoidosis of lung: Secondary | ICD-10-CM

## 2013-04-01 DIAGNOSIS — J99 Respiratory disorders in diseases classified elsewhere: Secondary | ICD-10-CM

## 2013-04-05 ENCOUNTER — Telehealth: Payer: Self-pay | Admitting: Pulmonary Disease

## 2013-04-05 NOTE — Telephone Encounter (Signed)
LMTCB

## 2013-04-05 NOTE — Telephone Encounter (Signed)
Ct Chest Wo Contrast  04/01/2013    *RADIOLOGY REPORT*   Clinical Data: Follow up sarcoid  CT CHEST WITHOUT CONTRAST   Technique:  Multidetector CT imaging of the chest was performed following the standard protocol without IV contrast.   Comparison: 08/06/2012   Findings: There is no pleural effusion identified.  Again identified is peribronchovascular and peri fissural nodularity with associated septal thickening, image 105/series 603.  No interstitial reticulation, honeycombing or bronchiectasis identified.  No specific features identified to suggest progression of disease.  The trachea appears patent and is midline.  Heart size is enlarged. No pericardial effusion identified.  Left chest wall pacer device is noted with leads in the left atrial appendage and left ventricle.  Again noted is mediastinal and hilar adenopathy.  Index right paratracheal lymph node measures 1.1 cm, image 20/series 2. This is compared with 1.5 cm previously.  The index subcarinal lymph node measures 1.3 cm, image number 27/series 2.  This is compared with the same previously.  There is no axillary or supraclavicular adenopathy.  Review of the visualized bony structures is unremarkable.  Incidental imaging through the upper abdomen demonstrates no acute findings.  There is a stable cyst within the caudate lobe of the liver, image 45/series 2.  Along the dome of the liver there is a 7 mm low attenuation structure, image number 38/series 2.  This is unchanged from previous exam likely represents a cyst.   IMPRESSION:   1.  No acute cardiopulmonary abnormalities.  2.  Stable CT appearance of the chest with findings compatible with sarcoidosis.    Original Report Authenticated By: Signa Kell, M.D.    Will have my nurse inform patient that CT chest shows some decrease in size of lymph glands in chest indicative of response to therapy for sarcoidosis.  No other new findings.  No change to current treatment plan.

## 2013-04-07 ENCOUNTER — Other Ambulatory Visit: Payer: Self-pay | Admitting: Family Medicine

## 2013-04-07 DIAGNOSIS — G459 Transient cerebral ischemic attack, unspecified: Secondary | ICD-10-CM

## 2013-04-07 NOTE — Telephone Encounter (Signed)
Pt is aware of results. 

## 2013-04-12 ENCOUNTER — Ambulatory Visit
Admission: RE | Admit: 2013-04-12 | Discharge: 2013-04-12 | Disposition: A | Discharge status: Home / Self Care | Payer: Medicare Other | Source: Ambulatory Visit | Attending: Family Medicine | Admitting: Family Medicine

## 2013-04-12 DIAGNOSIS — G459 Transient cerebral ischemic attack, unspecified: Secondary | ICD-10-CM

## 2013-04-19 ENCOUNTER — Other Ambulatory Visit: Payer: Self-pay | Admitting: *Deleted

## 2013-04-19 DIAGNOSIS — I5022 Chronic systolic (congestive) heart failure: Secondary | ICD-10-CM

## 2013-04-19 MED ORDER — CARVEDILOL 25 MG PO TABS: 12.5000 mg | ORAL_TABLET | Freq: Two times a day (BID) | ORAL | Status: DC

## 2013-04-21 ENCOUNTER — Ambulatory Visit (INDEPENDENT_AMBULATORY_CARE_PROVIDER_SITE_OTHER): Payer: Medicare Other | Admitting: Pulmonary Disease

## 2013-04-21 DIAGNOSIS — R0600 Dyspnea, unspecified: Secondary | ICD-10-CM

## 2013-04-21 DIAGNOSIS — R0609 Other forms of dyspnea: Secondary | ICD-10-CM

## 2013-04-21 LAB — PULMONARY FUNCTION TEST

## 2013-04-21 NOTE — Progress Notes (Signed)
PFT done today. 

## 2013-04-26 ENCOUNTER — Telehealth: Payer: Self-pay | Admitting: Pulmonary Disease

## 2013-04-26 NOTE — Telephone Encounter (Signed)
PFT 04/21/13 >> FEV1 1.05 (55%), FEV1% 75, TLC 2.70 (55%), DLCO 63%, no BD  PFT 08/10/12>>FEV1 1.08 (52%), FEV1% 70, TLC 2.77 (59%), DLCO 44%, no BD.  Will have my nurse inform pt that PFT shows some improvement compared to 08/10/12.  No change to current treatment plan.

## 2013-04-27 NOTE — Telephone Encounter (Signed)
lmtcb

## 2013-04-29 NOTE — Telephone Encounter (Signed)
Pt is aware of PFT results. 

## 2013-05-03 ENCOUNTER — Encounter: Payer: Self-pay | Admitting: *Deleted

## 2013-05-09 ENCOUNTER — Encounter: Payer: Self-pay | Admitting: Pulmonary Disease

## 2013-05-10 ENCOUNTER — Other Ambulatory Visit: Payer: Self-pay | Admitting: Internal Medicine

## 2013-05-12 ENCOUNTER — Ambulatory Visit (INDEPENDENT_AMBULATORY_CARE_PROVIDER_SITE_OTHER): Payer: Medicare Other | Admitting: *Deleted

## 2013-05-12 DIAGNOSIS — I471 Supraventricular tachycardia: Secondary | ICD-10-CM

## 2013-05-12 DIAGNOSIS — I498 Other specified cardiac arrhythmias: Secondary | ICD-10-CM

## 2013-05-12 DIAGNOSIS — Z950 Presence of cardiac pacemaker: Secondary | ICD-10-CM

## 2013-05-12 DIAGNOSIS — I495 Sick sinus syndrome: Secondary | ICD-10-CM

## 2013-05-12 DIAGNOSIS — I442 Atrioventricular block, complete: Secondary | ICD-10-CM

## 2013-05-12 LAB — PACEMAKER DEVICE OBSERVATION
AL THRESHOLD: 0.5 V
DEVICE MODEL PM: 1436713
RV LEAD THRESHOLD: 1 V

## 2013-05-12 NOTE — Progress Notes (Signed)
Pacer ck in clinic. Normal device function, no changes made, full details in [employer].  ROV w/ Dr. Graciela Husbands 07/14/13 @ 3:30

## 2013-05-23 ENCOUNTER — Encounter: Payer: Self-pay | Admitting: Internal Medicine

## 2013-06-06 ENCOUNTER — Other Ambulatory Visit: Payer: Self-pay | Admitting: Internal Medicine

## 2013-06-22 ENCOUNTER — Encounter: Payer: Self-pay | Admitting: Internal Medicine

## 2013-06-24 ENCOUNTER — Other Ambulatory Visit: Payer: Self-pay | Admitting: Internal Medicine

## 2013-07-14 ENCOUNTER — Encounter: Payer: Medicare Other | Admitting: Internal Medicine

## 2013-07-26 ENCOUNTER — Encounter: Payer: Self-pay | Admitting: Internal Medicine

## 2013-07-26 ENCOUNTER — Ambulatory Visit (INDEPENDENT_AMBULATORY_CARE_PROVIDER_SITE_OTHER): Payer: Medicare Other | Admitting: Internal Medicine

## 2013-07-26 VITALS — BP 119/75 | HR 67 | Ht 63.0 in | Wt 185.1 lb

## 2013-07-26 DIAGNOSIS — I442 Atrioventricular block, complete: Secondary | ICD-10-CM

## 2013-07-26 DIAGNOSIS — Z950 Presence of cardiac pacemaker: Secondary | ICD-10-CM

## 2013-07-26 DIAGNOSIS — I5022 Chronic systolic (congestive) heart failure: Secondary | ICD-10-CM

## 2013-07-26 DIAGNOSIS — I495 Sick sinus syndrome: Secondary | ICD-10-CM

## 2013-07-26 DIAGNOSIS — D869 Sarcoidosis, unspecified: Secondary | ICD-10-CM

## 2013-07-26 LAB — PACEMAKER DEVICE OBSERVATION
AL THRESHOLD: 0.75 V
BAMS-0001: 180 {beats}/min
DEVICE MODEL PM: 1436713
RV LEAD IMPEDENCE PM: 322 Ohm

## 2013-07-26 NOTE — Progress Notes (Signed)
HPI  [name] is a 67 y.o. 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     Myoview scanning demonstrated ejection fraction of 37% with no scar. This was consistent with prior testing. Echo April 2013 EF 35-40%.  She is 100% ventricularly paced with a QRS duration of 198 msec  She has very few symptoms. She is taking care of her brother and is doing quite well with that.  She has had no edema. With swelling she gets she thinks happened to her abdomen. There has been no syncope.         Past Medical History  Diagnosis Date  . NICM (nonischemic cardiomyopathy)     echo 1/11: EF 35-40%, mod LVH, Grade 1 diast dysfxn, mild BAE;    cath 2/06: luminal irregs;  Myoview 10/2:  no scar or ischemia, EF 37%   . Pacemaker     pacesetter. 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 Chronic systolic heart failure   . HTN (hypertension)   . HLD (hyperlipidemia)   . Sarcoidosis   . Complete heart block   . Atrial tachycardia   . Diverticulosis   . CKD (chronic kidney disease)   . Iritis   . Glaucoma     Past Surgical History  Procedure Laterality Date  . Av node ablation    . Status post dual chamber pacer      St. Jude model #5380, Identify  . Abdominal hysterectomy    . Tubal ligation      Current Outpatient Prescriptions  Medication Sig Dispense Refill  . albuterol (PROAIR HFA) 108 (90 BASE) MCG/ACT inhaler Inhale 2 puffs into the lungs every 6 (six) hours as needed for wheezing.  1 Inhaler  3  . aspirin 81 MG tablet Take 81 mg by mouth daily.      . bimatoprost (LUMIGAN) 0.01 % SOLN Place 1 drop into the right eye at bedtime.       . carvedilol (COREG) 25 MG tablet TAKE 1/2 TABLET BY MOUTH TWICE DAILY.*NEED OFFICE VISIT FOR MORE REFILL*  30 tablet  1  . clopidogrel (PLAVIX) 75 MG tablet       . CRESTOR 10 MG tablet TAKE ONE TABLET BY MOUTH ONE TIME DAILY  30 tablet  0  .  ezetimibe (ZETIA) 10 MG tablet Take 10 mg by mouth daily.      . famotidine (PEPCID) 20 MG tablet TAKE 1 TABLET BY MOUTH TWICE DAILY  60 tablet  0  . furosemide (LASIX) 20 MG tablet TAKE 1 TABLET BY MOUTH DAILY AS NEEDED (TAKE IF WEIGHT INCREASED BY 2 LBS OR MORE, OR IF YOU NOTICE INCREASED SHORTNESS OF BREATH OR EDEMA)  30 tablet  4  . hydroxychloroquine (PLAQUENIL) 200 MG tablet Take 400 mg by mouth daily.      Marland Kitchen loteprednol (LOTEMAX) 0.5 % ophthalmic suspension Place 1 drop into both eyes 3 (three) times daily.        . methylcellulose (ARTIFICIAL TEARS) 1 % ophthalmic solution Place 1 drop into both eyes as needed. For dry eyes      . nitroGLYCERIN (NITROSTAT) 0.4 MG SL tablet Place 0.4 mg under the tongue every 5 (five) minutes as needed. For chest pain       No current facility-administered medications for this visit.    Allergies  Allergen Reactions  . Codeine Itching  . Morphine And Related  Throat closing  . Penicillins Itching    Review of Systems negative except from HPI and PMH  Physical Exam BP 119/75  Pulse 67  Ht 5\' 3"  (1.6 m)  Wt 185 lb 1.9 oz (83.97 kg)  BMI 32.8 kg/m2 Well developed and well nourished in no acute distress HENT normal E scleral and icterus clear Neck Supple Clear to ausculation Device pocket well healed; without hematoma or erythema.  There is no tethering Regular rate and rhythm, no murmurs gallops or rub Soft with active bowel sounds No clubbing cyanosis none Edema Alert and oriented, grossly normal motor and sensory function Skin Warm and Dry  ECG demonstrates  AV pacing with a paced QRS of 200  assessment and  Plan

## 2013-07-26 NOTE — Patient Instructions (Addendum)
Your physician recommends that you schedule a follow-up appointment in: one month with device clinic   Your physician recommends that you schedule a follow-up appointment in: 3 months with Dr. Graciela Husbands.   Your physician recommends that you continue on your current medications as directed. Please refer to the Current Medication list given to you today.

## 2013-07-26 NOTE — Assessment & Plan Note (Signed)
She is euvolemic. Will continue on her current medications. The issue of her functional status is whether prompts CRT upgrade at the time of generator replacement. Given the paucity of symptoms, I think it would be appropriate to undertake C PX and we'll do that depending on what the ultrasound shows of her left ventricular function. We will obtain at her next visit in 3 months.

## 2013-07-26 NOTE — Assessment & Plan Note (Signed)
The presence of sarcoid raises the concern regarding sudden death and whether her pacer should be upgraded to an ICD generator replacement. We'll look at the left ventricular function assessment.

## 2013-07-26 NOTE — Assessment & Plan Note (Signed)
She is device dependent both  A nad V

## 2013-07-26 NOTE — Assessment & Plan Note (Signed)
As above.

## 2013-08-02 ENCOUNTER — Encounter: Payer: Self-pay | Admitting: Internal Medicine

## 2013-08-06 ENCOUNTER — Other Ambulatory Visit: Payer: Self-pay | Admitting: Internal Medicine

## 2013-08-11 ENCOUNTER — Other Ambulatory Visit: Payer: Self-pay | Admitting: Internal Medicine

## 2013-08-23 ENCOUNTER — Other Ambulatory Visit: Payer: Self-pay | Admitting: Internal Medicine

## 2013-08-31 ENCOUNTER — Ambulatory Visit (INDEPENDENT_AMBULATORY_CARE_PROVIDER_SITE_OTHER): Payer: Medicare Other | Admitting: *Deleted

## 2013-08-31 DIAGNOSIS — Z45010 Encounter for checking and testing of cardiac pacemaker pulse generator [battery]: Secondary | ICD-10-CM

## 2013-08-31 DIAGNOSIS — Z45018 Encounter for adjustment and management of other part of cardiac pacemaker: Secondary | ICD-10-CM

## 2013-08-31 LAB — MDC_IDC_ENUM_SESS_TYPE_INCLINIC
Battery Voltage: 2.61 V
Brady Statistic RA Percent Paced: 79 %
Lead Channel Impedance Value: 322 Ohm
Lead Channel Impedance Value: 419 Ohm

## 2013-08-31 NOTE — Progress Notes (Signed)
Battery check only. Battery at 2.61V, ERI = 2.5V.   ROV w/ device clinic 10/13/13.

## 2013-09-14 ENCOUNTER — Other Ambulatory Visit: Payer: Self-pay | Admitting: Internal Medicine

## 2013-09-20 ENCOUNTER — Other Ambulatory Visit: Payer: Self-pay | Admitting: Internal Medicine

## 2013-10-11 ENCOUNTER — Other Ambulatory Visit: Payer: Self-pay | Admitting: Internal Medicine

## 2013-10-13 ENCOUNTER — Ambulatory Visit (INDEPENDENT_AMBULATORY_CARE_PROVIDER_SITE_OTHER): Payer: Medicare Other | Admitting: *Deleted

## 2013-10-13 DIAGNOSIS — Z45010 Encounter for checking and testing of cardiac pacemaker pulse generator [battery]: Secondary | ICD-10-CM

## 2013-10-13 DIAGNOSIS — Z45018 Encounter for adjustment and management of other part of cardiac pacemaker: Secondary | ICD-10-CM

## 2013-10-13 LAB — MDC_IDC_ENUM_SESS_TYPE_INCLINIC
Battery Impedance: 17000 Ohm
Battery Remaining Longevity: 0 mo
Brady Statistic RA Percent Paced: 87 %
Brady Statistic RV Percent Paced: 99 %
Date Time Interrogation Session: 20150108095211
Implantable Pulse Generator Serial Number: 1436713
Lead Channel Setting Pacing Amplitude: 2.5 V
Lead Channel Setting Pacing Pulse Width: 0.8 ms
Lead Channel Setting Sensing Sensitivity: 5 mV
MDC IDC MSMT BATTERY VOLTAGE: 2.58 V
MDC IDC MSMT LEADCHNL RA IMPEDANCE VALUE: 432 Ohm
MDC IDC MSMT LEADCHNL RV IMPEDANCE VALUE: 333 Ohm
MDC IDC SET LEADCHNL RA PACING AMPLITUDE: 2 V

## 2013-10-13 NOTE — Progress Notes (Signed)
Battery check only, @ 2.58V, ERI = 2.5V. Remaining longevity <3 months. 784 AT/AF episodes, no EGMs, longest 70min 20sec.   ROV w/ device clinic 11/17/13 @ 3:00

## 2013-10-19 ENCOUNTER — Other Ambulatory Visit: Payer: Self-pay | Admitting: Internal Medicine

## 2013-10-26 ENCOUNTER — Other Ambulatory Visit: Payer: Self-pay | Admitting: Internal Medicine

## 2013-11-01 ENCOUNTER — Encounter: Payer: Self-pay | Admitting: Internal Medicine

## 2013-11-07 ENCOUNTER — Encounter: Payer: Self-pay | Admitting: Internal Medicine

## 2013-11-10 ENCOUNTER — Other Ambulatory Visit: Payer: Self-pay | Admitting: Internal Medicine

## 2013-11-17 ENCOUNTER — Other Ambulatory Visit: Payer: Self-pay | Admitting: Internal Medicine

## 2013-11-17 ENCOUNTER — Ambulatory Visit (INDEPENDENT_AMBULATORY_CARE_PROVIDER_SITE_OTHER): Payer: Medicare Other | Admitting: *Deleted

## 2013-11-17 DIAGNOSIS — I495 Sick sinus syndrome: Secondary | ICD-10-CM

## 2013-11-17 LAB — MDC_IDC_ENUM_SESS_TYPE_INCLINIC: MDC IDC PG SERIAL: 1436713

## 2013-11-17 NOTE — Progress Notes (Signed)
Interrogation only for battery longevity.  Battery @ ERI.  ROV 12/08/13 with Dr. [name] to discuss change out.

## 2013-12-08 ENCOUNTER — Encounter: Payer: Self-pay | Admitting: Internal Medicine

## 2013-12-08 ENCOUNTER — Ambulatory Visit (INDEPENDENT_AMBULATORY_CARE_PROVIDER_SITE_OTHER): Payer: Medicare Other | Admitting: Internal Medicine

## 2013-12-08 VITALS — BP 94/59 | HR 53 | Ht 63.0 in | Wt 182.0 lb

## 2013-12-08 DIAGNOSIS — Z950 Presence of cardiac pacemaker: Secondary | ICD-10-CM

## 2013-12-08 DIAGNOSIS — I442 Atrioventricular block, complete: Secondary | ICD-10-CM

## 2013-12-08 DIAGNOSIS — I5022 Chronic systolic (congestive) heart failure: Secondary | ICD-10-CM

## 2013-12-08 NOTE — Progress Notes (Signed)
Patient Care Team: Reginia Naas, MD as PCP - General (Family Medicine)   HPI  [name] is a 68 y.o. 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  Myoview scanning demonstrated ejection fraction of 37% with no scar. This was consistent with prior testing. Echo April 2013 EF 35-40%.   She is 100% ventricularly paced with a QRS duration of 198 msec   Extremely fatigued. She is not sleeping well. She is caring for her older brother who is quite ill. She denies chest pain. No significant edema.     Past Medical History  Diagnosis Date  . NICM (nonischemic cardiomyopathy)     echo 1/11: EF 35-40%, mod LVH, Grade 1 diast dysfxn, mild BAE;    cath 2/06: luminal irregs;  Myoview 10/2:  no scar or ischemia, EF 37%   . Pacemaker     pacesetter. Implanted [id number] secondary to possible cardiac sarcoid  . Chronic systolic heart failure   . HTN (hypertension)   . HLD (hyperlipidemia)   . Sarcoidosis   . Complete heart block   . Atrial tachycardia   . Diverticulosis   . CKD (chronic kidney disease)   . Iritis   . Glaucoma     Past Surgical History  Procedure Laterality Date  . Av node ablation    . Status post dual chamber pacer      St. Jude model #5380, Identify  . Abdominal hysterectomy    . Tubal ligation      Current Outpatient Prescriptions  Medication Sig Dispense Refill  . albuterol (PROAIR HFA) 108 (90 BASE) MCG/ACT inhaler Inhale 2 puffs into the lungs every 6 (six) hours as needed for wheezing.  1 Inhaler  3  . aspirin 81 MG tablet Take 81 mg by mouth daily.      . bimatoprost (LUMIGAN) 0.01 % SOLN Place 1 drop into the right eye at bedtime.       . carvedilol (COREG) 25 MG tablet TAKE 1/2 TABLET BY MOUTH TWICE DAILY WITH A MEAL  30 tablet  0  . clopidogrel (PLAVIX) 75 MG tablet       . CRESTOR 10 MG tablet TAKE ONE TABLET BY MOUTH ONE TIME DAILY  30 tablet  0  .  ezetimibe (ZETIA) 10 MG tablet Take 10 mg by mouth daily.      . famotidine (PEPCID) 20 MG tablet TAKE 1 TABLET BY MOUTH TWICE DAILY  60 tablet  0  . furosemide (LASIX) 20 MG tablet TAKE 1 TABLET BY MOUTH EVERY DAY AS NEEDED(TAKE IF WEIGHT INCREASD BY 2 LBS OR MORE, OR IF YOU NOTICE INCREASED SHORTNESS OF BREATH)  30 tablet  0  . hydroxychloroquine (PLAQUENIL) 200 MG tablet Take 400 mg by mouth daily.      Marland Kitchen loteprednol (LOTEMAX) 0.5 % ophthalmic suspension Place 1 drop into both eyes 3 (three) times daily.        . methylcellulose (ARTIFICIAL TEARS) 1 % ophthalmic solution Place 1 drop into both eyes as needed. For dry eyes      . nitroGLYCERIN (NITROSTAT) 0.4 MG SL tablet Place 0.4 mg under the tongue every 5 (five) minutes as needed. For chest pain       No current facility-administered medications for this visit.    Allergies  Allergen Reactions  . Codeine Itching  . Morphine And Related     Throat  closing  . Penicillins Itching    Review of Systems negative except from HPI and PMH  Physical Exam BP 94/59  Pulse 53  Ht 5\' 3"  (1.6 m)  Wt 182 lb (82.555 kg)  BMI 32.25 kg/m2 Well developed and nourished in no acute distress HENT normal Neck supple with JVP-flat Clear Regular rate and rhythm, no murmurs or gallops Abd-soft with active BS No Clubbing cyanosis edema Skin-warm and dry A & Oriented  Grossly normal sensory and motor function Device pocket well healed; without hematoma or erythema.  There is no tethering  ECG demonstrates    AV pacing  Assessment and  Plan  Nonischemic cardiomyopathy   Sarcoid heart disease   Complete heart block   Congestive heart failure-class III   Fatigue-sleep-disordered breathing   Pacemaker at [facility]  The patient's device has reached ERI. She has depressed left ventricular function. We will reassess that. There are no suggestions that patient's sarcoid heart disease are significant risk for sudden death with ventricular  arrhythmias and for consideration of ICD implant patient is appropriate.  Furthermore, with congestive heart failure and 100% ventricular pacing resynchronization is appropriate to consider a generator replacement.  With her significant fatigue, we'll also undertake a sleep study.

## 2013-12-08 NOTE — Patient Instructions (Addendum)
Your physician recommends that you continue on your current medications as directed. Please refer to the Current Medication list given to you today.  Your physician has requested that you have an echocardiogram. Echocardiography is a painless test that uses sound waves to create images of your heart. It provides your doctor with information about the size and shape of your heart and how well your heart's chambers and valves are working. This procedure takes approximately one hour. There are no restrictions for this procedure.  Please call Brendalyn Vallely [phone] after echo to schedule upgrade from pacemaker to CRT-D

## 2013-12-09 ENCOUNTER — Other Ambulatory Visit: Payer: Self-pay | Admitting: Cardiovascular Disease

## 2013-12-12 ENCOUNTER — Other Ambulatory Visit: Payer: Self-pay | Admitting: Internal Medicine

## 2013-12-13 ENCOUNTER — Telehealth: Payer: Self-pay | Admitting: Internal Medicine

## 2013-12-13 DIAGNOSIS — Z950 Presence of cardiac pacemaker: Secondary | ICD-10-CM

## 2013-12-13 DIAGNOSIS — Z01812 Encounter for preprocedural laboratory examination: Secondary | ICD-10-CM

## 2013-12-13 DIAGNOSIS — I442 Atrioventricular block, complete: Secondary | ICD-10-CM

## 2013-12-13 NOTE — Telephone Encounter (Signed)
New message ° ° ° ° ° ° ° ° ° °Pt returning nurses call °

## 2013-12-14 ENCOUNTER — Encounter: Payer: Self-pay | Admitting: Internal Medicine

## 2013-12-14 NOTE — Telephone Encounter (Signed)
Called patient to schedule PPM generator change. Pt agreed upon 3/30. I will arrange everything and call pt back to discuss details today/Friday.  Pt agreeable to plan.

## 2013-12-16 ENCOUNTER — Encounter (HOSPITAL_COMMUNITY): Payer: Self-pay | Admitting: Pharmacy Technician

## 2013-12-16 ENCOUNTER — Other Ambulatory Visit: Payer: Self-pay | Admitting: Internal Medicine

## 2013-12-20 ENCOUNTER — Encounter: Payer: Self-pay | Admitting: *Deleted

## 2013-12-20 NOTE — Telephone Encounter (Signed)
Went over procedure instructions with pt, letter of instructions left at front desk for pt pick up. Pre procedure labs scheduled for 3/23, when she comes in for her echo. Post op wound check scheduled for 4/9. Patient verbalized understanding and agreeable to plan.

## 2013-12-26 ENCOUNTER — Ambulatory Visit (HOSPITAL_COMMUNITY): Payer: Medicare Other | Attending: Internal Medicine | Admitting: Radiology

## 2013-12-26 ENCOUNTER — Other Ambulatory Visit (INDEPENDENT_AMBULATORY_CARE_PROVIDER_SITE_OTHER): Payer: Medicare Other

## 2013-12-26 DIAGNOSIS — Z01812 Encounter for preprocedural laboratory examination: Secondary | ICD-10-CM

## 2013-12-26 DIAGNOSIS — Z950 Presence of cardiac pacemaker: Secondary | ICD-10-CM

## 2013-12-26 DIAGNOSIS — I442 Atrioventricular block, complete: Secondary | ICD-10-CM

## 2013-12-26 DIAGNOSIS — I4430 Unspecified atrioventricular block: Secondary | ICD-10-CM | POA: Insufficient documentation

## 2013-12-26 DIAGNOSIS — I5022 Chronic systolic (congestive) heart failure: Secondary | ICD-10-CM

## 2013-12-26 LAB — CBC WITH DIFFERENTIAL/PLATELET
Basophils Absolute: 0 10*3/uL (ref 0.0–0.1)
Basophils Relative: 0.4 % (ref 0.0–3.0)
EOS PCT: 3 % (ref 0.0–5.0)
Eosinophils Absolute: 0.1 10*3/uL (ref 0.0–0.7)
HCT: 34.2 % — ABNORMAL LOW (ref 36.0–46.0)
Hemoglobin: 11.3 g/dL — ABNORMAL LOW (ref 12.0–15.0)
LYMPHS PCT: 16.4 % (ref 12.0–46.0)
Lymphs Abs: 0.7 10*3/uL (ref 0.7–4.0)
MCHC: 32.9 g/dL (ref 30.0–36.0)
MCV: 89.3 fl (ref 78.0–100.0)
MONOS PCT: 7.4 % (ref 3.0–12.0)
Monocytes Absolute: 0.3 10*3/uL (ref 0.1–1.0)
NEUTROS PCT: 72.8 % (ref 43.0–77.0)
Neutro Abs: 3.2 10*3/uL (ref 1.4–7.7)
PLATELETS: 163 10*3/uL (ref 150.0–400.0)
RBC: 3.83 Mil/uL — AB (ref 3.87–5.11)
RDW: 13.6 % (ref 11.5–14.6)
WBC: 4.4 10*3/uL — AB (ref 4.5–10.5)

## 2013-12-26 LAB — BASIC METABOLIC PANEL
BUN: 35 mg/dL — AB (ref 6–23)
CO2: 26 mEq/L (ref 19–32)
CREATININE: 2.3 mg/dL — AB (ref 0.4–1.2)
Calcium: 10 mg/dL (ref 8.4–10.5)
Chloride: 105 mEq/L (ref 96–112)
GFR: 27.74 mL/min — AB (ref >60.00)
Glucose, Bld: 83 mg/dL (ref 70–99)
POTASSIUM: 4.5 meq/L (ref 3.5–5.1)
Sodium: 140 mEq/L (ref 135–145)

## 2013-12-26 NOTE — Progress Notes (Signed)
Echocardiogram performed.  

## 2013-12-30 ENCOUNTER — Other Ambulatory Visit: Payer: Self-pay | Admitting: Internal Medicine

## 2013-12-30 DIAGNOSIS — I442 Atrioventricular block, complete: Secondary | ICD-10-CM

## 2014-01-01 MED ORDER — SODIUM CHLORIDE 0.9 % IR SOLN
80.0000 mg | Status: DC
  Filled 2014-01-01: qty 2

## 2014-01-01 MED ORDER — VANCOMYCIN HCL IN DEXTROSE 1-5 GM/200ML-% IV SOLN
1000.0000 mg | INTRAVENOUS | Status: DC
  Filled 2014-01-01: qty 200

## 2014-01-02 ENCOUNTER — Encounter (HOSPITAL_COMMUNITY)
Admission: RE | Disposition: A | Discharge status: Home / Self Care | Payer: Self-pay | Source: Ambulatory Visit | Attending: Internal Medicine

## 2014-01-02 ENCOUNTER — Ambulatory Visit (HOSPITAL_COMMUNITY)
Admission: RE | Admit: 2014-01-02 | Discharge: 2014-01-02 | Disposition: A | Discharge status: Home / Self Care | Payer: Medicare Other | Source: Ambulatory Visit | Attending: Internal Medicine | Admitting: Internal Medicine

## 2014-01-02 DIAGNOSIS — N189 Chronic kidney disease, unspecified: Secondary | ICD-10-CM | POA: Insufficient documentation

## 2014-01-02 DIAGNOSIS — I442 Atrioventricular block, complete: Secondary | ICD-10-CM

## 2014-01-02 DIAGNOSIS — I129 Hypertensive chronic kidney disease with stage 1 through stage 4 chronic kidney disease, or unspecified chronic kidney disease: Secondary | ICD-10-CM | POA: Insufficient documentation

## 2014-01-02 DIAGNOSIS — I498 Other specified cardiac arrhythmias: Secondary | ICD-10-CM | POA: Insufficient documentation

## 2014-01-02 DIAGNOSIS — H409 Unspecified glaucoma: Secondary | ICD-10-CM | POA: Insufficient documentation

## 2014-01-02 DIAGNOSIS — Z45018 Encounter for adjustment and management of other part of cardiac pacemaker: Secondary | ICD-10-CM | POA: Insufficient documentation

## 2014-01-02 DIAGNOSIS — E785 Hyperlipidemia, unspecified: Secondary | ICD-10-CM | POA: Insufficient documentation

## 2014-01-02 DIAGNOSIS — I43 Cardiomyopathy in diseases classified elsewhere: Secondary | ICD-10-CM | POA: Insufficient documentation

## 2014-01-02 DIAGNOSIS — D869 Sarcoidosis, unspecified: Secondary | ICD-10-CM | POA: Insufficient documentation

## 2014-01-02 DIAGNOSIS — I5022 Chronic systolic (congestive) heart failure: Secondary | ICD-10-CM | POA: Insufficient documentation

## 2014-01-02 DIAGNOSIS — H209 Unspecified iridocyclitis: Secondary | ICD-10-CM | POA: Insufficient documentation

## 2014-01-02 DIAGNOSIS — Z7982 Long term (current) use of aspirin: Secondary | ICD-10-CM | POA: Insufficient documentation

## 2014-01-02 DIAGNOSIS — Z79899 Other long term (current) drug therapy: Secondary | ICD-10-CM | POA: Insufficient documentation

## 2014-01-02 DIAGNOSIS — I509 Heart failure, unspecified: Secondary | ICD-10-CM | POA: Insufficient documentation

## 2014-01-02 DIAGNOSIS — Z7902 Long term (current) use of antithrombotics/antiplatelets: Secondary | ICD-10-CM | POA: Insufficient documentation

## 2014-01-02 DIAGNOSIS — K5730 Diverticulosis of large intestine without perforation or abscess without bleeding: Secondary | ICD-10-CM | POA: Insufficient documentation

## 2014-01-02 HISTORY — PX: PERMANENT PACEMAKER GENERATOR CHANGE: SHX6022

## 2014-01-02 LAB — SURGICAL PCR SCREEN
MRSA, PCR: NEGATIVE
Staphylococcus aureus: NEGATIVE

## 2014-01-02 SURGERY — PERMANENT PACEMAKER GENERATOR CHANGE
Anesthesia: LOCAL

## 2014-01-02 MED ORDER — SODIUM CHLORIDE 0.9 % IV SOLN: INTRAVENOUS | Status: AC

## 2014-01-02 MED ORDER — CARVEDILOL 25 MG PO TABS: 25.0000 mg | ORAL_TABLET | Freq: Two times a day (BID) | ORAL | Status: DC

## 2014-01-02 MED ORDER — SODIUM CHLORIDE 0.9 % IV SOLN
INTRAVENOUS | Status: DC
  Administered 2014-01-02: 07:00:00 via INTRAVENOUS

## 2014-01-02 MED ORDER — MUPIROCIN 2 % EX OINT
TOPICAL_OINTMENT | CUTANEOUS | Status: AC
  Filled 2014-01-02: qty 22

## 2014-01-02 MED ORDER — LIDOCAINE HCL (PF) 1 % IJ SOLN
INTRAMUSCULAR | Status: AC
  Filled 2014-01-02: qty 90

## 2014-01-02 MED ORDER — ACETAMINOPHEN 325 MG PO TABS
325.0000 mg | ORAL_TABLET | ORAL | Status: DC | PRN
  Filled 2014-01-02: qty 2

## 2014-01-02 MED ORDER — ONDANSETRON HCL 4 MG/2ML IJ SOLN: 4.0000 mg | Freq: Four times a day (QID) | INTRAMUSCULAR | Status: DC | PRN

## 2014-01-02 MED ORDER — MUPIROCIN 2 % EX OINT
TOPICAL_OINTMENT | Freq: Two times a day (BID) | CUTANEOUS | Status: DC
  Administered 2014-01-02: 07:00:00 via NASAL
  Filled 2014-01-02: qty 22

## 2014-01-02 NOTE — H&P (View-Only) (Signed)
Patient Care Team: Reginia Naas, MD as PCP - General (Family Medicine)   HPI  [name] is a 68 y.o. 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  Myoview scanning demonstrated ejection fraction of 37% with no scar. This was consistent with prior testing. Echo April 2013 EF 35-40%.   She is 100% ventricularly paced with a QRS duration of 198 msec   Extremely fatigued. She is not sleeping well. She is caring for her older brother who is quite ill. She denies chest pain. No significant edema.     Past Medical History  Diagnosis Date  . NICM (nonischemic cardiomyopathy)     echo 1/11: EF 35-40%, mod LVH, Grade 1 diast dysfxn, mild BAE;    cath 2/06: luminal irregs;  Myoview 10/2:  no scar or ischemia, EF 37%   . Pacemaker     pacesetter. Implanted [id number] secondary to possible cardiac sarcoid  . Chronic systolic heart failure   . HTN (hypertension)   . HLD (hyperlipidemia)   . Sarcoidosis   . Complete heart block   . Atrial tachycardia   . Diverticulosis   . CKD (chronic kidney disease)   . Iritis   . Glaucoma     Past Surgical History  Procedure Laterality Date  . Av node ablation    . Status post dual chamber pacer      St. Jude model #5380, Identify  . Abdominal hysterectomy    . Tubal ligation      Current Outpatient Prescriptions  Medication Sig Dispense Refill  . albuterol (PROAIR HFA) 108 (90 BASE) MCG/ACT inhaler Inhale 2 puffs into the lungs every 6 (six) hours as needed for wheezing.  1 Inhaler  3  . aspirin 81 MG tablet Take 81 mg by mouth daily.      . bimatoprost (LUMIGAN) 0.01 % SOLN Place 1 drop into the right eye at bedtime.       . carvedilol (COREG) 25 MG tablet TAKE 1/2 TABLET BY MOUTH TWICE DAILY WITH A MEAL  30 tablet  0  . clopidogrel (PLAVIX) 75 MG tablet       . CRESTOR 10 MG tablet TAKE ONE TABLET BY MOUTH ONE TIME DAILY  30 tablet  0  .  ezetimibe (ZETIA) 10 MG tablet Take 10 mg by mouth daily.      . famotidine (PEPCID) 20 MG tablet TAKE 1 TABLET BY MOUTH TWICE DAILY  60 tablet  0  . furosemide (LASIX) 20 MG tablet TAKE 1 TABLET BY MOUTH EVERY DAY AS NEEDED(TAKE IF WEIGHT INCREASD BY 2 LBS OR MORE, OR IF YOU NOTICE INCREASED SHORTNESS OF BREATH)  30 tablet  0  . hydroxychloroquine (PLAQUENIL) 200 MG tablet Take 400 mg by mouth daily.      Marland Kitchen loteprednol (LOTEMAX) 0.5 % ophthalmic suspension Place 1 drop into both eyes 3 (three) times daily.        . methylcellulose (ARTIFICIAL TEARS) 1 % ophthalmic solution Place 1 drop into both eyes as needed. For dry eyes      . nitroGLYCERIN (NITROSTAT) 0.4 MG SL tablet Place 0.4 mg under the tongue every 5 (five) minutes as needed. For chest pain       No current facility-administered medications for this visit.    Allergies  Allergen Reactions  . Codeine Itching  . Morphine And Related     Throat  closing  . Penicillins Itching    Review of Systems negative except from HPI and PMH  Physical Exam BP 94/59  Pulse 53  Ht 5\' 3"  (1.6 m)  Wt 182 lb (82.555 kg)  BMI 32.25 kg/m2 Well developed and nourished in no acute distress HENT normal Neck supple with JVP-flat Clear Regular rate and rhythm, no murmurs or gallops Abd-soft with active BS No Clubbing cyanosis edema Skin-warm and dry A & Oriented  Grossly normal sensory and motor function Device pocket well healed; without hematoma or erythema.  There is no tethering  ECG demonstrates    AV pacing  Assessment and  Plan  Nonischemic cardiomyopathy   Sarcoid heart disease   Complete heart block   Congestive heart failure-class III   Fatigue-sleep-disordered breathing   Pacemaker at [facility]  The patient's device has reached ERI. She has depressed left ventricular function. We will reassess that. There are no suggestions that patient's sarcoid heart disease are significant risk for sudden death with ventricular  arrhythmias and for consideration of ICD implant patient is appropriate.  Furthermore, with congestive heart failure and 100% ventricular pacing resynchronization is appropriate to consider a generator replacement.  With her significant fatigue, we'll also undertake a sleep study.

## 2014-01-02 NOTE — Discharge Instructions (Signed)
° °  Supplemental Discharge Instructions following  Generator Change  NO DRIVING for 3 days. WOUND CARE   Keep the wound area clean and dry.  You may shower but no soaking in tub bath, swimming pool or hot tub for 10-14 days until wound completely healed.    The Dermabond (glue) on your wound will fall off on its own; do not pull it off.  No bandage is needed on the site.  DO NOT apply any creams, oils, or ointments to the wound area.   If you notice any drainage or discharge from the wound, any swelling or bruising at the site, or you develop a fever > 101? F after you are discharged home, call the office at once.

## 2014-01-02 NOTE — CV Procedure (Signed)
Preoperative diagnosis  Complete heart block  Postoperative diagnosis same/   Procedure: Generator replacement     Following informed consent the patient was brought to the electrophysiology laboratory in place of the fluoroscopic table in the supine position after routine prep and drape lidocaine was infiltrated in the region of the previous incision and carried down to later the device pocket using sharp dissection and electrocautery. The pocket was opened the device was freed up and was explanted.  Interrogation of the previously implanted ventricular lead [employer] Jude   demonstrated an R wave of N/A  millivolts., and impedance of 350 ohms, and a pacing threshold of 1.0 volts at 0.8 msec.    The previously implanted atrial lead [employer] Jude  demonstrated a P-wave amplitude of N/A milllivolts  and impedance of  490 ohms, and a pacing threshold of 1.0 volts at  @ 0.30milliseconds.  The leads were inspected. The leads were then attached to a [employer]  pulse generator, serial number [id number].    The pocket was irrigated with antibiotic containing saline solution hemostasis was assured and the leads and the device were placed in the pocket. The wound was then closed in 2 layers in normal fashion.  The patient tolerated the procedure without apparent complication.  A CRT device with LV port Plugged because of concerns of LV dusfunction and potential upgrade to CRT  [employer]

## 2014-01-02 NOTE — Interval H&P Note (Signed)
History and Physical Interval Note:  01/02/2014 7:38 AM  [name]  has presented today for surgery, with the diagnosis of chb  The various methods of treatment have been discussed with the patient and family. After consideration of risks, benefits and other options for treatment, the patient has consented to  Procedure(s): PERMANENT PACEMAKER GENERATOR CHANGE (N/A) as a surgical intervention .  The patient's history has been reviewed, patient examined, no change in status, stable for surgery.  I have reviewed the patient's chart and labs.  Questions were answered to the patient's satisfaction.    There are equivocal indications for upgrading the device both to an ICD and CRT  She underwent CPX about 2 yrs ago and was found to have largely a pulmonary/obesity limitation  Her QRS is very broad notwithstanding.  For now will anticipate replacement of the current device  [employer]

## 2014-01-04 ENCOUNTER — Encounter (HOSPITAL_COMMUNITY): Payer: Self-pay | Admitting: *Deleted

## 2014-01-07 ENCOUNTER — Other Ambulatory Visit: Payer: Self-pay | Admitting: Internal Medicine

## 2014-01-12 ENCOUNTER — Ambulatory Visit (INDEPENDENT_AMBULATORY_CARE_PROVIDER_SITE_OTHER): Payer: 59 | Admitting: *Deleted

## 2014-01-12 DIAGNOSIS — D869 Sarcoidosis, unspecified: Secondary | ICD-10-CM

## 2014-01-12 DIAGNOSIS — I442 Atrioventricular block, complete: Secondary | ICD-10-CM

## 2014-01-12 LAB — MDC_IDC_ENUM_SESS_TYPE_INCLINIC
Battery Voltage: 3.11 V
Brady Statistic RA Percent Paced: 95 %
Brady Statistic RV Percent Paced: 99.95 %
Date Time Interrogation Session: 20150409122947
Implantable Pulse Generator Serial Number: 7610861
Lead Channel Impedance Value: 337.5 Ohm
Lead Channel Pacing Threshold Amplitude: 1 V
Lead Channel Pacing Threshold Amplitude: 1 V
Lead Channel Pacing Threshold Amplitude: 1.25 V
Lead Channel Pacing Threshold Pulse Width: 0.4 ms
Lead Channel Pacing Threshold Pulse Width: 0.5 ms
Lead Channel Setting Pacing Amplitude: 2 V
Lead Channel Setting Sensing Sensitivity: 8 mV
MDC IDC MSMT BATTERY REMAINING LONGEVITY: 96 mo
MDC IDC MSMT LEADCHNL RA IMPEDANCE VALUE: 462.5 Ohm
MDC IDC MSMT LEADCHNL RA PACING THRESHOLD PULSEWIDTH: 0.4 ms
MDC IDC MSMT LEADCHNL RA SENSING INTR AMPL: 5 mV
MDC IDC MSMT LEADCHNL RV PACING THRESHOLD AMPLITUDE: 1.25 V
MDC IDC MSMT LEADCHNL RV PACING THRESHOLD PULSEWIDTH: 0.5 ms
MDC IDC PG MODEL: 3242
MDC IDC SET LEADCHNL RV PACING AMPLITUDE: 2.5 V
MDC IDC SET LEADCHNL RV PACING PULSEWIDTH: 0.5 ms

## 2014-01-12 NOTE — Progress Notes (Signed)
Wound check appointment.  Wound without redness or edema. Incision edges approximated, wound well healed. Normal device function. Thresholds, sensing, and impedances consistent with implant measurements. Device programmed at 3.5V/auto capture programmed on for extra safety margin until 3 month visit. Histogram distribution appropriate for patient and level of activity. No mode switches or high ventricular rates noted. Patient educated about wound care, arm mobility, lifting restrictions. ROV in 3 months [employer].

## 2014-02-10 ENCOUNTER — Encounter: Payer: Self-pay | Admitting: Internal Medicine

## 2014-03-07 ENCOUNTER — Other Ambulatory Visit: Payer: Self-pay | Admitting: Internal Medicine

## 2014-03-14 IMAGING — US US CAROTID DUPLEX BILAT
1 series · 13 of 24 positions shown · non-contrast
Comparison: None.

CLINICAL DATA: TIA.

BILATERAL CAROTID DUPLEX ULTRASOUND
TECHNIQUE: Gray scale imaging, color Doppler and duplex ultrasound
were performed of bilateral carotid and vertebral arteries in the
neck.

[Series 1: us carotid duplex bilat · 0.08mm/px · 13 of 66 slices shown]
[im 1/66]
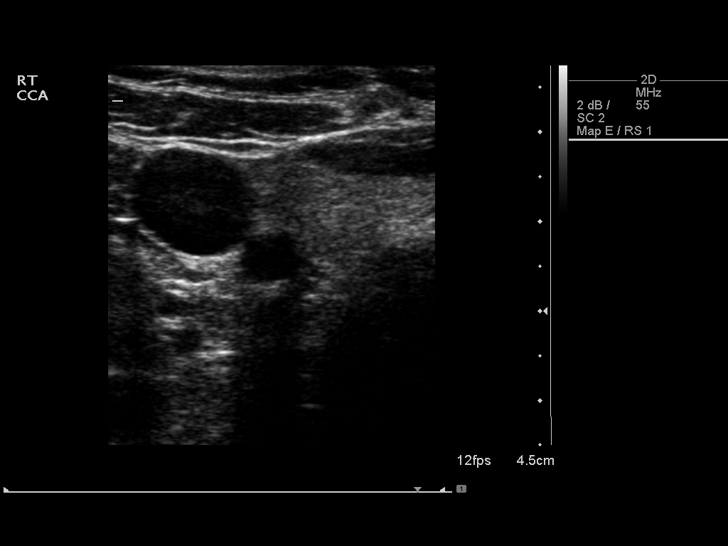
[im 6/66]
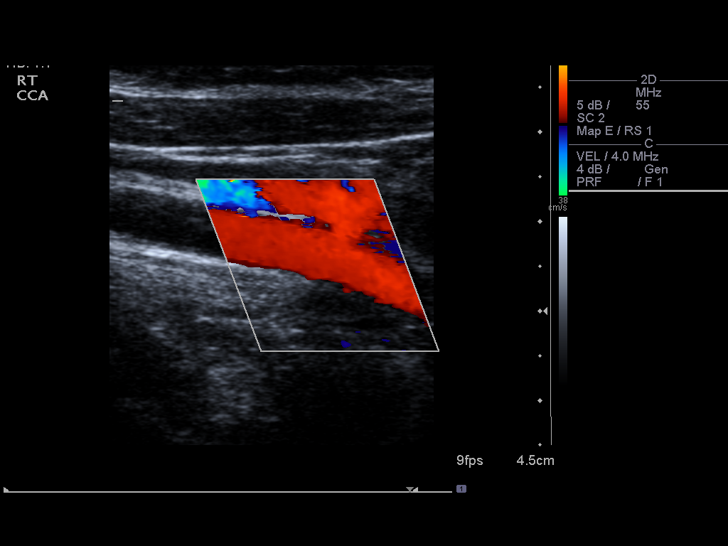
[im 12/66]
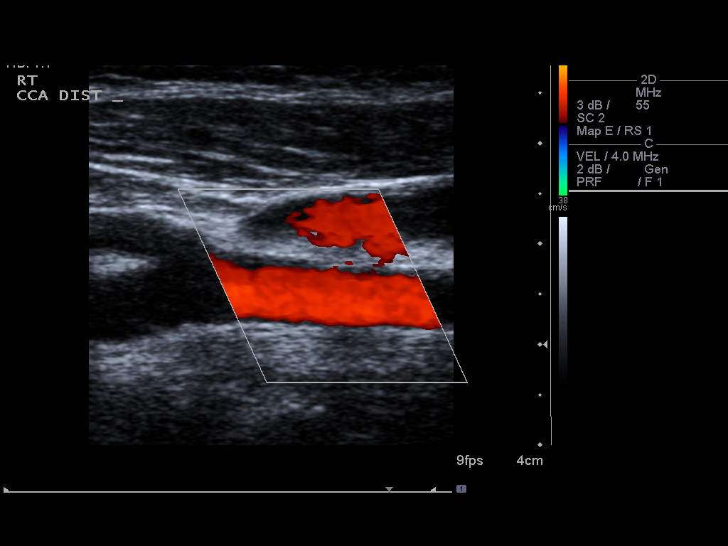
[im 17/66]
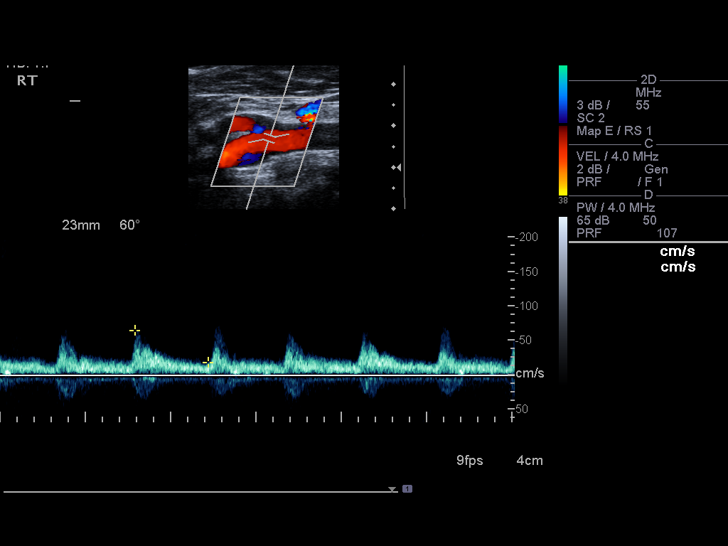
[im 23/66]
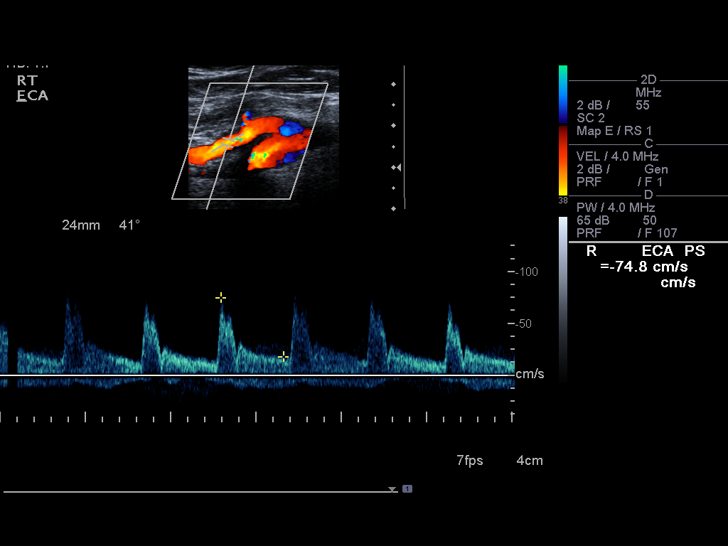
[im 29/66]
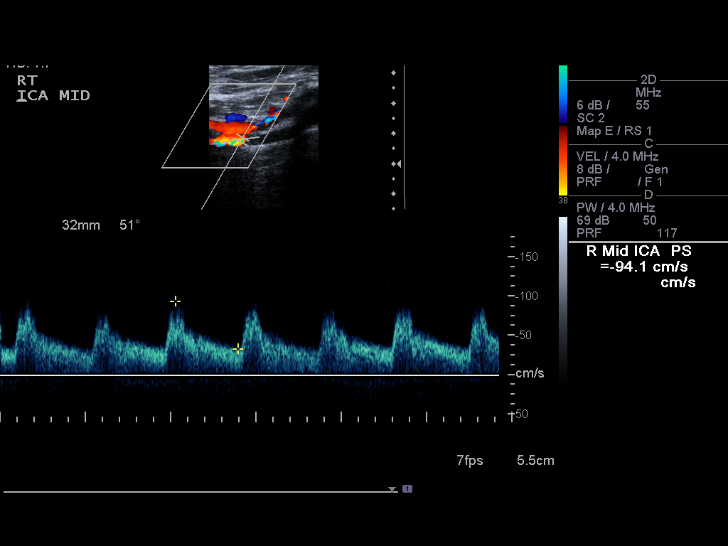
[im 34/66]
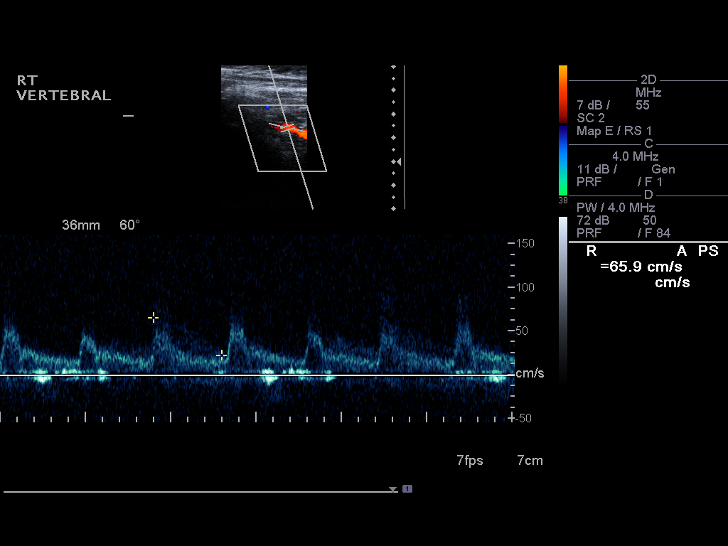
[im 37/66]
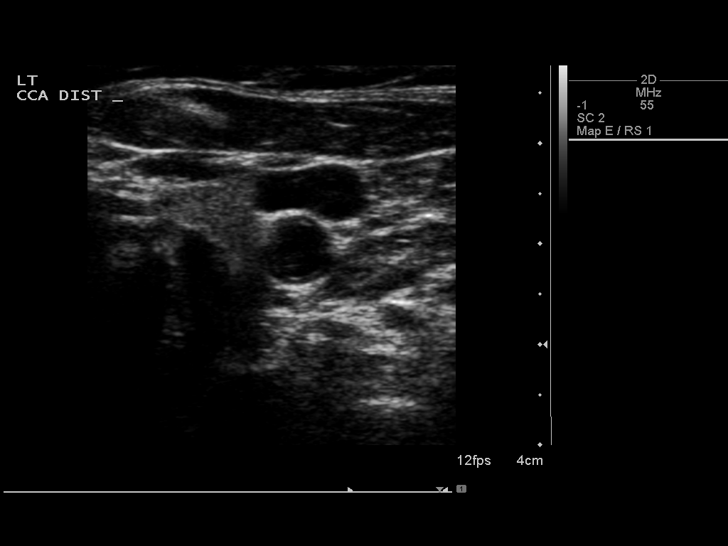
[im 43/66]
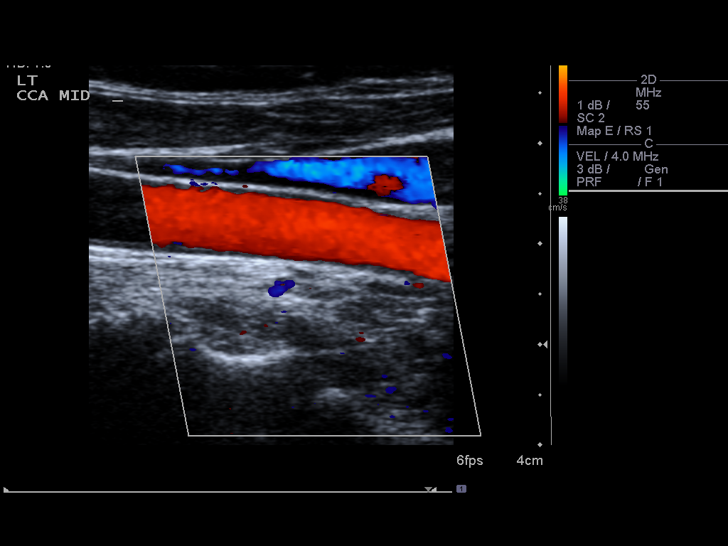
[im 49/66]
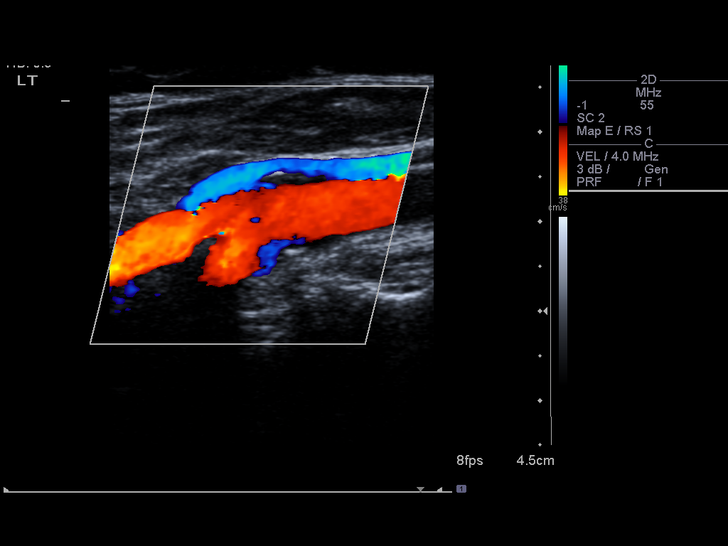
[im 54/66]
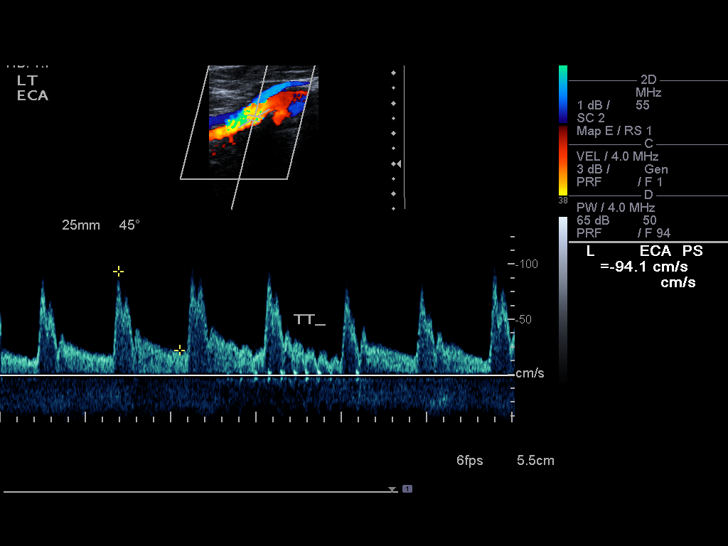
[im 60/66]
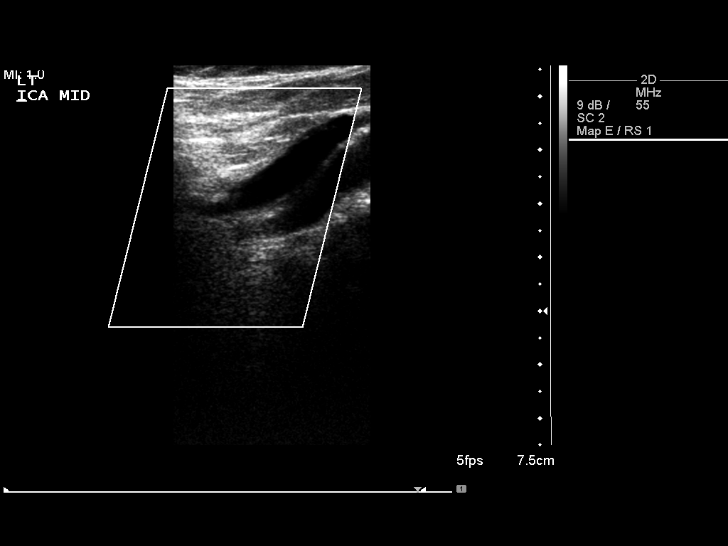
[im 66/66]
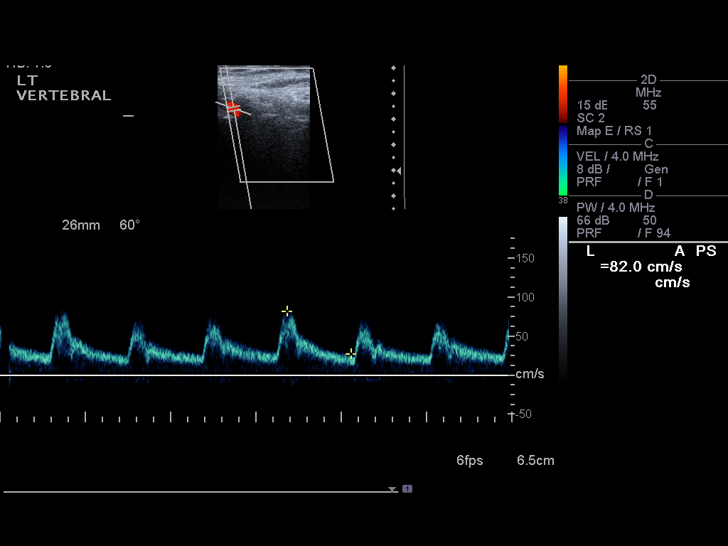

[13 of 24 positions shown; findings below may reference images not displayed]

Criteria:  Quantification of carotid stenosis is based on velocity
parameters that correlate the residual internal carotid diameter
with NASCET-based stenosis levels, using the diameter of the distal
internal carotid lumen as the denominator for stenosis measurement.

The following velocity measurements were obtained:

                 PEAK SYSTOLIC/END DIASTOLIC
RIGHT
ICA:                        94cm/sec
CCA:                        111cm/sec
SYSTOLIC ICA/CCA RATIO:
DIASTOLIC ICA/CCA RATIO:
ECA:                        76cm/sec

LEFT
ICA:                        82cm/sec
CCA:                        123cm/sec
SYSTOLIC ICA/CCA RATIO:
DIASTOLIC ICA/CCA RATIO:
ECA:                        94cm/sec
FINDINGS: RIGHT CAROTID ARTERY: Right carotid arteries are patent without
significant plaque.  No evidence for stenosis.

RIGHT VERTEBRAL ARTERY:  Antegrade flow and normal waveform in the
right vertebral artery.]

LEFT CAROTID ARTERY: There is mild plaque in the left carotid bulb.
Normal waveforms and velocities in the left internal carotid
artery.  No significant stenosis.

LEFT VERTEBRAL ARTERY:  Antegrade flow and normal waveform in the
left vertebral artery.]
IMPRESSION: Minimal atherosclerotic disease in the carotid arteries.  No
significant stenosis.  Estimated degree of stenosis in the internal
carotid arteries is less than 50% bilaterally.

## 2014-03-15 ENCOUNTER — Telehealth: Payer: Self-pay | Admitting: *Deleted

## 2014-03-15 NOTE — Telephone Encounter (Signed)
Left message to call back  (need to discuss if she is still having significant fatigue -- need to order sleep study)

## 2014-03-15 NOTE — Telephone Encounter (Signed)
Follow up ° ° ° ° ° ° °Returning [name]'s call °

## 2014-03-15 NOTE — Telephone Encounter (Signed)
Discussed with patient if she is still having significant fatigue/snoring. Pt states that she feels much better since pacemaker generator replacement and her fatigue has improved.  Advised to call us back is fatigue returns/worsens and we will be glad to arrange sleep study. She is agreeable to this.

## 2014-03-22 ENCOUNTER — Encounter: Payer: Self-pay | Admitting: Cardiology

## 2014-03-30 ENCOUNTER — Encounter: Payer: Self-pay | Admitting: *Deleted

## 2014-04-07 ENCOUNTER — Other Ambulatory Visit: Payer: Self-pay | Admitting: Internal Medicine

## 2014-04-11 ENCOUNTER — Encounter: Payer: 59 | Admitting: Cardiology

## 2014-04-21 ENCOUNTER — Ambulatory Visit (INDEPENDENT_AMBULATORY_CARE_PROVIDER_SITE_OTHER): Payer: 59 | Admitting: Internal Medicine

## 2014-04-21 ENCOUNTER — Encounter: Payer: Self-pay | Admitting: Internal Medicine

## 2014-04-21 VITALS — BP 126/59 | HR 60 | Ht 63.0 in | Wt 187.0 lb

## 2014-04-21 DIAGNOSIS — G4710 Hypersomnia, unspecified: Secondary | ICD-10-CM

## 2014-04-21 DIAGNOSIS — R400 Somnolence: Secondary | ICD-10-CM

## 2014-04-21 DIAGNOSIS — I428 Other cardiomyopathies: Secondary | ICD-10-CM

## 2014-04-21 DIAGNOSIS — I5022 Chronic systolic (congestive) heart failure: Secondary | ICD-10-CM

## 2014-04-21 DIAGNOSIS — I495 Sick sinus syndrome: Secondary | ICD-10-CM

## 2014-04-21 DIAGNOSIS — Z950 Presence of cardiac pacemaker: Secondary | ICD-10-CM

## 2014-04-21 DIAGNOSIS — I442 Atrioventricular block, complete: Secondary | ICD-10-CM

## 2014-04-21 LAB — MDC_IDC_ENUM_SESS_TYPE_INCLINIC
Battery Remaining Longevity: 93.6 mo
Brady Statistic RA Percent Paced: 88 %
Date Time Interrogation Session: 20150717114031
Lead Channel Impedance Value: 350 Ohm
Lead Channel Impedance Value: 450 Ohm
Lead Channel Pacing Threshold Amplitude: 0.75 V
Lead Channel Pacing Threshold Amplitude: 0.75 V
Lead Channel Pacing Threshold Amplitude: 1.25 V
Lead Channel Pacing Threshold Pulse Width: 0.4 ms
Lead Channel Pacing Threshold Pulse Width: 0.5 ms
Lead Channel Pacing Threshold Pulse Width: 0.5 ms
Lead Channel Sensing Intrinsic Amplitude: 5 mV
Lead Channel Setting Pacing Amplitude: 2.5 V
Lead Channel Setting Sensing Sensitivity: 8 mV
MDC IDC MSMT BATTERY VOLTAGE: 3.02 V
MDC IDC MSMT LEADCHNL RA PACING THRESHOLD PULSEWIDTH: 0.4 ms
MDC IDC MSMT LEADCHNL RV PACING THRESHOLD AMPLITUDE: 1.25 V
MDC IDC PG MODEL: 3242
MDC IDC PG SERIAL: 7610861
MDC IDC SET LEADCHNL RA PACING AMPLITUDE: 2 V
MDC IDC SET LEADCHNL RV PACING PULSEWIDTH: 0.5 ms
MDC IDC STAT BRADY RV PERCENT PACED: 99.99 %

## 2014-04-21 NOTE — Patient Instructions (Addendum)
Your physician recommends that you continue on your current medications as directed. Please refer to the Current Medication list given to you today.  Your physician has recommended that you have a sleep study. This test records several body functions during sleep, including: brain activity, eye movement, oxygen and carbon dioxide blood levels, heart rate and rhythm, breathing rate and rhythm, the flow of air through your mouth and nose, snoring, body muscle movements, and chest and belly movement.  Remote monitoring is used to monitor your Pacemaker of ICD from home. This monitoring reduces the number of office visits required to check your device to one time per year. It allows [place] to keep an eye on the functioning of your device to ensure it is working properly. You are scheduled for a device check from home on 07/24/14. You may send your transmission at any time that day. If you have a wireless device, the transmission will be sent automatically. After your physician reviews your transmission, you will receive a postcard with your next transmission date.  Your physician wants you to follow-up in: 9 months with Dr. [name].  You will receive a reminder letter in the mail two months in advance. If you don't receive a letter, please call our office to schedule the follow-up appointment.

## 2014-04-21 NOTE — Progress Notes (Signed)
Patient Care Team: Reginia Naas, MD as PCP - General (Family Medicine)   HPI  [name] is a 68 y.o. 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  Myoview scanning demonstrated ejection fraction of 37% with no scar. This was consistent with prior testing. Echo April 2013 EF 35-40%. In March 2015 it was 40-45%.   She was 100% ventricularly paced with a QRS duration of 198 msec   Her device had reached ERI; because the ejection fraction was about 40%, she received a CRT can but no LV lead was placed.  Functional status is relatively stable without complaints of shortness of breath. She does have chronic fatigue. Her care for her brother is alleviated by day care for him     Past Medical History  Diagnosis Date  . NICM (nonischemic cardiomyopathy)     echo 1/11: EF 35-40%, mod LVH, Grade 1 diast dysfxn, mild BAE;    cath 2/06: luminal irregs;  Myoview 10/2:  no scar or ischemia, EF 37%   . Chronic systolic heart failure   . HTN (hypertension)   . HLD (hyperlipidemia)   . Sarcoidosis   . Complete heart block     s/p STJ dual chamber pacemaker  . Atrial tachycardia   . Diverticulosis   . CKD (chronic kidney disease)   . Iritis   . Glaucoma   . Other primary cardiomyopathies     Past Surgical History  Procedure Laterality Date  . Av node ablation    . Pacemaker insertion  1999; 12/26/13    STJ dual chamber pacemaker implanted 1999; gen change 12/2013 by Dr [name] - STJ   . Abdominal hysterectomy    . Tubal ligation    . US echocardiography      EF was 40-45% with left atrial enlargement. test was done for possible TIA. She had mild LVH 1.3 cm     Current Outpatient Prescriptions  Medication Sig Dispense Refill  . albuterol (PROAIR HFA) 108 (90 BASE) MCG/ACT inhaler Inhale 2 puffs into the lungs every 6 (six) hours as needed for wheezing.  1 Inhaler  3  . aspirin EC 81 MG  tablet Take 81 mg by mouth daily.      . bimatoprost (LUMIGAN) 0.01 % SOLN Place 1 drop into the right eye at bedtime.       . carvedilol (COREG) 25 MG tablet Take 1 tablet (25 mg total) by mouth 2 (two) times daily with a meal.  25 tablet  6  . clopidogrel (PLAVIX) 75 MG tablet Take 75 mg by mouth once.       . ezetimibe (ZETIA) 10 MG tablet Take 10 mg by mouth daily.      . famotidine (PEPCID) 20 MG tablet Take 20 mg by mouth 2 (two) times daily.      . furosemide (LASIX) 20 MG tablet TAKE 1 TABLET BY MOUTH EVERY DAY AS NEEDED IF WEIGHT INCREASED BY 2 LBS OR MORE, INCREASED SHORTNESS OF BREATH  30 tablet  0  . hydroxychloroquine (PLAQUENIL) 200 MG tablet Take 200 mg by mouth 2 (two) times daily.       Marland Kitchen loteprednol (LOTEMAX) 0.5 % ophthalmic suspension Place 1 drop into both eyes 4 (four) times daily.       . methylcellulose (ARTIFICIAL TEARS) 1 % ophthalmic solution Place 1 drop into both eyes daily as needed. For  dry eyes      . Multiple Vitamins-Minerals (HAIR/SKIN/NAILS PO) Take 2 tablets by mouth daily.      . nitroGLYCERIN (NITROSTAT) 0.4 MG SL tablet Place 0.4 mg under the tongue every 5 (five) minutes as needed for chest pain. For chest pain      . rosuvastatin (CRESTOR) 10 MG tablet Take 10 mg by mouth daily.       No current facility-administered medications for this visit.    Allergies  Allergen Reactions  . Codeine Itching  . Morphine And Related     Throat closing  . Penicillins Itching    Review of Systems negative except from HPI and PMH  Physical Exam BP 126/59  Pulse 60  Ht 5\' 3"  (1.6 m)  Wt 187 lb (84.823 kg)  BMI 33.13 kg/m2 Well developed and nourished in no acute distress HENT normal Neck supple with JVP-flat Clear Regular rate and rhythm, no murmurs or gallops Abd-soft with active BS No Clubbing cyanosis edema Skin-warm and dry A & Oriented  Grossly normal sensory and motor function Device pocket well healed; without hematoma or erythema.  There is no  tethering  ECG demonstrates    AV pacing  Assessment and  Plan  Nonischemic cardiomyopathy   Sarcoid heart disease   Complete heart block   Sinus node dysfunction  Congestive heart failure-class II   Fatigue-sleep-disordered breathing   Pacemaker  St Jude The patient's device was interrogated and the information was fully reviewed.  The device was reprogrammed to  activate rate response   She is euvolemic  we'll continue current medications.   With her significant fatigue, we'll also undertake a sleep study.  She has significant sinus node dysfunction. Her rate response was activated  In the event that she develops more shortness of breath, I would have a relatively low threshold for pursuing CRT upgrade

## 2014-04-30 ENCOUNTER — Other Ambulatory Visit: Payer: Self-pay | Admitting: Internal Medicine

## 2014-05-07 ENCOUNTER — Other Ambulatory Visit: Payer: Self-pay | Admitting: Internal Medicine

## 2014-06-19 ENCOUNTER — Ambulatory Visit (HOSPITAL_BASED_OUTPATIENT_CLINIC_OR_DEPARTMENT_OTHER): Payer: PRIVATE HEALTH INSURANCE | Attending: Internal Medicine

## 2014-06-19 VITALS — Ht 63.0 in | Wt 189.0 lb

## 2014-06-19 DIAGNOSIS — G4733 Obstructive sleep apnea (adult) (pediatric): Secondary | ICD-10-CM | POA: Diagnosis not present

## 2014-06-19 DIAGNOSIS — G4710 Hypersomnia, unspecified: Secondary | ICD-10-CM | POA: Diagnosis not present

## 2014-06-19 DIAGNOSIS — R400 Somnolence: Secondary | ICD-10-CM

## 2014-06-23 DIAGNOSIS — G4730 Sleep apnea, unspecified: Secondary | ICD-10-CM

## 2014-06-23 DIAGNOSIS — G4710 Hypersomnia, unspecified: Secondary | ICD-10-CM

## 2014-06-23 NOTE — Sleep Study (Signed)
   NAME: [name] DATE OF BIRTH:  [birth year] MEDICAL RECORD NUMBER [id number]  LOCATION: [facility]  PHYSICIAN: Kathee Delton  DATE OF STUDY: 06/19/2014  SLEEP STUDY TYPE: Nocturnal Polysomnogram               REFERRING PHYSICIAN: Deboraha Sprang, MD  INDICATION FOR STUDY: Hypersomnia with sleep apnea  EPWORTH SLEEPINESS SCORE:  8 HEIGHT: 5\' 3"  (160 cm)  WEIGHT: 189 lb (85.73 kg)    Body mass index is 33.49 kg/(m^2).  NECK SIZE: 14 in.  MEDICATIONS: Reviewed in the sleep record  SLEEP ARCHITECTURE: The patient had a total sleep time of 237 minutes, with no slow-wave sleep and only 41 minutes of REM. Sleep onset latency was prolonged at 59 minutes, and REM onset was normal. Sleep efficiency was very poor at 59%.  RESPIRATORY DATA: The patient was found to have 7 apneas and 29 obstructive hypopneas, giving her an AHI of 9 events per hour. The events occurred primarily in the supine position, and there was loud snoring noted throughout. She did not meet split-night protocol secondary to the small numbers of events.  OXYGEN DATA: There was oxygen desaturation as low as 83% with the patient's obstructive events  CARDIAC DATA:  Paced rhythm noted  MOVEMENT/PARASOMNIA: No significant periodic limb movements or abnormal behaviors were seen  IMPRESSION/ RECOMMENDATION:    1) mild obstructive sleep apnea/hypopnea syndrome, with an AHI of 9 events per hour and oxygen desaturation transiently as low as 83%. Treatment for this degree of sleep apnea can include a trial of weight loss alone, upper airway surgery, dental appliance, and CPAP. Clinical correlation is suggested.     [place], American Board of Sleep Medicine  ELECTRONICALLY SIGNED ON:  06/23/2014, 6:31 PM [place] PH: [phone]   FX: [phone] [place]

## 2014-07-24 ENCOUNTER — Encounter: Payer: Self-pay | Admitting: Internal Medicine

## 2014-07-24 LAB — MDC_IDC_ENUM_SESS_TYPE_REMOTE
Brady Statistic AP VS Percent: 1 %
Brady Statistic AS VP Percent: 16 %
Brady Statistic RA Percent Paced: 88 %
Brady Statistic RV Percent Paced: 99 %
Date Time Interrogation Session: 20151019060014
Implantable Pulse Generator Model: 3242
Implantable Pulse Generator Serial Number: 7610861
Lead Channel Impedance Value: 350 Ohm
Lead Channel Impedance Value: 460 Ohm
Lead Channel Pacing Threshold Amplitude: 0.75 V
Lead Channel Pacing Threshold Pulse Width: 0.4 ms
Lead Channel Sensing Intrinsic Amplitude: 5 mV
Lead Channel Setting Pacing Pulse Width: 0.5 ms
Lead Channel Setting Sensing Sensitivity: 8 mV
MDC IDC MSMT BATTERY REMAINING LONGEVITY: 95 mo
MDC IDC MSMT BATTERY REMAINING PERCENTAGE: 95.5 %
MDC IDC MSMT BATTERY VOLTAGE: 3.01 V
MDC IDC MSMT LEADCHNL RV PACING THRESHOLD AMPLITUDE: 1.25 V
MDC IDC MSMT LEADCHNL RV PACING THRESHOLD PULSEWIDTH: 0.5 ms
MDC IDC SET LEADCHNL RA PACING AMPLITUDE: 2 V
MDC IDC SET LEADCHNL RV PACING AMPLITUDE: 2.5 V
MDC IDC STAT BRADY AP VP PERCENT: 84 %
MDC IDC STAT BRADY AS VS PERCENT: 1 %

## 2014-07-25 ENCOUNTER — Ambulatory Visit (INDEPENDENT_AMBULATORY_CARE_PROVIDER_SITE_OTHER): Payer: 59 | Admitting: *Deleted

## 2014-07-25 DIAGNOSIS — I442 Atrioventricular block, complete: Secondary | ICD-10-CM

## 2014-07-25 DIAGNOSIS — I5022 Chronic systolic (congestive) heart failure: Secondary | ICD-10-CM

## 2014-07-25 NOTE — Progress Notes (Signed)
Remote pacemaker transmission.   

## 2014-08-09 ENCOUNTER — Encounter: Payer: Self-pay | Admitting: Cardiology

## 2014-08-09 ENCOUNTER — Other Ambulatory Visit: Payer: Self-pay | Admitting: Internal Medicine

## 2014-08-09 MED ORDER — CARVEDILOL 25 MG PO TABS: 25.0000 mg | ORAL_TABLET | Freq: Two times a day (BID) | ORAL | Status: DC

## 2014-09-14 ENCOUNTER — Encounter (HOSPITAL_COMMUNITY): Payer: Self-pay | Admitting: Internal Medicine

## 2014-10-30 ENCOUNTER — Ambulatory Visit (INDEPENDENT_AMBULATORY_CARE_PROVIDER_SITE_OTHER): Payer: PRIVATE HEALTH INSURANCE | Admitting: *Deleted

## 2014-10-30 ENCOUNTER — Telehealth: Payer: Self-pay | Admitting: Cardiology

## 2014-10-30 DIAGNOSIS — I5022 Chronic systolic (congestive) heart failure: Secondary | ICD-10-CM

## 2014-10-30 DIAGNOSIS — I442 Atrioventricular block, complete: Secondary | ICD-10-CM

## 2014-10-30 NOTE — Telephone Encounter (Signed)
Spoke with pt and reminded pt of remote transmission that is due today. Pt verbalized understanding.   

## 2014-10-30 NOTE — Progress Notes (Signed)
Remote pacemaker transmission.   

## 2014-10-31 LAB — MDC_IDC_ENUM_SESS_TYPE_REMOTE
Brady Statistic RA Percent Paced: 83 %
Brady Statistic RV Percent Paced: 99 %
Lead Channel Sensing Intrinsic Amplitude: 5 mV
Lead Channel Setting Pacing Amplitude: 2.5 V
MDC IDC MSMT LEADCHNL RA IMPEDANCE VALUE: 480 Ohm
MDC IDC MSMT LEADCHNL RV IMPEDANCE VALUE: 350 Ohm
MDC IDC PG MODEL: 3242
MDC IDC PG SERIAL: 7610861
MDC IDC SET LEADCHNL RA PACING AMPLITUDE: 2 V
MDC IDC SET LEADCHNL RV PACING PULSEWIDTH: 0.5 ms
MDC IDC SET LEADCHNL RV SENSING SENSITIVITY: 8 mV

## 2014-11-29 ENCOUNTER — Other Ambulatory Visit: Payer: Self-pay | Admitting: Internal Medicine

## 2014-12-14 ENCOUNTER — Encounter: Payer: Self-pay | Admitting: *Deleted

## 2014-12-21 ENCOUNTER — Encounter: Payer: Self-pay | Admitting: Internal Medicine

## 2015-01-24 ENCOUNTER — Other Ambulatory Visit: Payer: Self-pay | Admitting: Internal Medicine

## 2015-01-29 ENCOUNTER — Ambulatory Visit (INDEPENDENT_AMBULATORY_CARE_PROVIDER_SITE_OTHER): Payer: Medicare Other | Admitting: Internal Medicine

## 2015-01-29 ENCOUNTER — Encounter: Payer: Self-pay | Admitting: Internal Medicine

## 2015-01-29 VITALS — BP 118/72 | HR 60 | Ht 63.0 in | Wt 192.6 lb

## 2015-01-29 DIAGNOSIS — I5022 Chronic systolic (congestive) heart failure: Secondary | ICD-10-CM

## 2015-01-29 DIAGNOSIS — Z45018 Encounter for adjustment and management of other part of cardiac pacemaker: Secondary | ICD-10-CM

## 2015-01-29 DIAGNOSIS — I495 Sick sinus syndrome: Secondary | ICD-10-CM | POA: Diagnosis not present

## 2015-01-29 DIAGNOSIS — Z950 Presence of cardiac pacemaker: Secondary | ICD-10-CM | POA: Diagnosis not present

## 2015-01-29 DIAGNOSIS — I442 Atrioventricular block, complete: Secondary | ICD-10-CM

## 2015-01-29 LAB — MDC_IDC_ENUM_SESS_TYPE_INCLINIC
Battery Remaining Longevity: 98.4 mo
Battery Voltage: 3.01 V
Brady Statistic RA Percent Paced: 83 %
Brady Statistic RV Percent Paced: 99.98 %
Date Time Interrogation Session: 20160425105911
Implantable Pulse Generator Model: 3242
Lead Channel Impedance Value: 475 Ohm
Lead Channel Pacing Threshold Amplitude: 0.75 V
Lead Channel Pacing Threshold Amplitude: 1 V
Lead Channel Pacing Threshold Amplitude: 1.125 V
Lead Channel Pacing Threshold Pulse Width: 0.4 ms
Lead Channel Pacing Threshold Pulse Width: 0.4 ms
Lead Channel Pacing Threshold Pulse Width: 0.5 ms
Lead Channel Pacing Threshold Pulse Width: 0.5 ms
Lead Channel Sensing Intrinsic Amplitude: 5 mV
Lead Channel Setting Pacing Amplitude: 2.125
MDC IDC MSMT LEADCHNL RA PACING THRESHOLD AMPLITUDE: 0.75 V
MDC IDC MSMT LEADCHNL RV IMPEDANCE VALUE: 350 Ohm
MDC IDC PG SERIAL: 7610861
MDC IDC SET LEADCHNL RA PACING AMPLITUDE: 2 V
MDC IDC SET LEADCHNL RV PACING PULSEWIDTH: 0.5 ms
MDC IDC SET LEADCHNL RV SENSING SENSITIVITY: 8 mV

## 2015-01-29 NOTE — Progress Notes (Signed)
Patient Care Team: Carol Ada, MD as PCP - General (Family Medicine)   HPI  [name] is a 69 y.o. 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  Myoview scanning demonstrated ejection fraction of 37% with no scar. This was consistent with prior testing. Echo April 2013 EF 35-40%. In March 2015 it was 40-45%.   She was 100% ventricularly paced with a QRS duration of 198 msec   Her device had reached ERI; because the ejection fraction was about 40%, she received a CRT can but no LV lead was placed.  Functional status is relatively stable without complaints of shortness of breath. She does have chronic fatigue  and a sleep study 4/16 demonstrated an AHI of 9.8.  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 She has aches and pains.    Past Medical History  Diagnosis Date  . NICM (nonischemic cardiomyopathy)     echo 1/11: EF 35-40%, mod LVH, Grade 1 diast dysfxn, mild BAE;    cath 2/06: luminal irregs;  Myoview 10/2:  no scar or ischemia, EF 37%   . Chronic systolic heart failure   . HTN (hypertension)   . HLD (hyperlipidemia)   . Sarcoidosis   . Complete heart block     s/p STJ dual chamber pacemaker  . Atrial tachycardia   . Diverticulosis   . CKD (chronic kidney disease)   . Iritis   . Glaucoma   . Other primary cardiomyopathies     Past Surgical History  Procedure Laterality Date  . Av node ablation    . Pacemaker insertion  1999; 12/26/13    STJ dual chamber pacemaker implanted 1999; gen change 12/2013 by Dr [name] - STJ   . Abdominal hysterectomy    . Tubal ligation    . US echocardiography      EF was 40-45% with left atrial enlargement. test was done for possible TIA. She had mild LVH 1.3 cm   . Permanent pacemaker generator change N/A 01/02/2014    Procedure: PERMANENT PACEMAKER GENERATOR CHANGE;  Surgeon: Deboraha Sprang, MD;  Location: [facility]  CATH LAB;  Service: Cardiovascular;  Laterality: N/A;    Current Outpatient Prescriptions  Medication Sig Dispense Refill  . albuterol (PROAIR HFA) 108 (90 BASE) MCG/ACT inhaler Inhale 2 puffs into the lungs every 6 (six) hours as needed for wheezing. 1 Inhaler 3  . bimatoprost (LUMIGAN) 0.01 % SOLN Place 1 drop into the right eye at bedtime.     . carvedilol (COREG) 25 MG tablet TAKE 1 TABLET BY MOUTH TWICE DAILY WITH A MEAL 60 tablet 0  . clopidogrel (PLAVIX) 75 MG tablet Take 75 mg by mouth once.     . ezetimibe (ZETIA) 10 MG tablet Take 10 mg by mouth daily.    . famotidine (PEPCID) 20 MG tablet TAKE 1 TABLET BY MOUTH TWICE DAILY 60 tablet 9  . furosemide (LASIX) 20 MG tablet TAKE 1 TABLET BY MOUTH EVERY DAY AS NEEDED IF WEIGHT INCREASED BY 2 LBS OR MORE, INCREASED SHORTNESS OF BREATH 30 tablet 0  . hydroxychloroquine (PLAQUENIL) 200 MG tablet Take 200 mg by mouth 2 (two) times daily.     Marland Kitchen loteprednol (LOTEMAX) 0.5 % ophthalmic suspension Place 1 drop into both eyes 4 (four) times daily.     . methylcellulose (ARTIFICIAL TEARS) 1 % ophthalmic solution Place 1 drop into  both eyes daily as needed. For dry eyes    . Multiple Vitamins-Minerals (HAIR/SKIN/NAILS PO) Take 2 tablets by mouth daily.    . nitroGLYCERIN (NITROSTAT) 0.4 MG SL tablet Place 0.4 mg under the tongue every 5 (five) minutes as needed for chest pain. For chest pain    . rosuvastatin (CRESTOR) 10 MG tablet Take 10 mg by mouth daily.     No current facility-administered medications for this visit.    Allergies  Allergen Reactions  . Codeine Itching  . Morphine And Related     Throat closing  . Penicillins Itching    Review of Systems negative except from HPI and PMH  Physical Exam BP 118/72 mmHg  Pulse 60  Ht 5\' 3"  (1.6 m)  Wt 192 lb 9.6 oz (87.363 kg)  BMI 34.13 kg/m2 Well developed and nourished in no acute distress HENT normal Neck supple with JVP-flat Clear Regular rate and rhythm, no murmurs or  gallops Abd-soft with active BS No Clubbing cyanosis edema Skin-warm and dry A & Oriented  Grossly normal sensory and motor function Device pocket well healed; without hematoma or erythema.  There is no tethering  ECG demonstrates AV pacing 60  Assessment and  Plan  Nonischemic cardiomyopathy   Sarcoid heart disease   Complete heart block   Renal Insufficiency  Grade 4   Sinus node dysfunction  Congestive heart failure-class II   Fatigue-sleep-disordered breathing   Pacemaker  St Jude The patient's device was interrogated and the information was fully reviewed.  The device was reprogrammed to  activate rate response  No interval Labs here but renal function is followed by Dr. Florene Glen.  She is euvolemic  we'll continue current medications.   She has significant sinus node dysfunction. Her rate response was activated with good resposne

## 2015-01-29 NOTE — Patient Instructions (Signed)
Medication Instructions:  Your physician recommends that you continue on your current medications as directed. Please refer to the Current Medication list given to you today.  Labwork: None  Testing/Procedures: None  Follow-Up: Remote monitoring is used to monitor your Pacemaker of ICD from home. This monitoring reduces the number of office visits required to check your device to one time per year. It allows [place] to keep an eye on the functioning of your device to ensure it is working properly. You are scheduled for a device check from home on 04/30/15. You may send your transmission at any time that day. If you have a wireless device, the transmission will be sent automatically. After your physician reviews your transmission, you will receive a postcard with your next transmission date.  Your physician wants you to follow-up in: 1 year with Dr. [name].  You will receive a reminder letter in the mail two months in advance. If you don't receive a letter, please call our office to schedule the follow-up appointment.   Any Other Special Instructions Will Be Listed Below (If Applicable).

## 2015-02-21 ENCOUNTER — Other Ambulatory Visit: Payer: Self-pay | Admitting: Internal Medicine

## 2015-03-09 ENCOUNTER — Other Ambulatory Visit: Payer: Self-pay | Admitting: Internal Medicine

## 2015-03-09 NOTE — Telephone Encounter (Signed)
Per note 4.25.16 

## 2015-04-30 ENCOUNTER — Ambulatory Visit (INDEPENDENT_AMBULATORY_CARE_PROVIDER_SITE_OTHER): Payer: Medicare Other | Admitting: *Deleted

## 2015-04-30 ENCOUNTER — Encounter: Payer: Self-pay | Admitting: Internal Medicine

## 2015-04-30 DIAGNOSIS — I442 Atrioventricular block, complete: Secondary | ICD-10-CM | POA: Diagnosis not present

## 2015-04-30 DIAGNOSIS — Z950 Presence of cardiac pacemaker: Secondary | ICD-10-CM | POA: Diagnosis not present

## 2015-04-30 DIAGNOSIS — I5022 Chronic systolic (congestive) heart failure: Secondary | ICD-10-CM

## 2015-04-30 DIAGNOSIS — I495 Sick sinus syndrome: Secondary | ICD-10-CM | POA: Diagnosis not present

## 2015-05-08 LAB — CUP PACEART REMOTE DEVICE CHECK
Battery Remaining Longevity: 103 mo
Battery Remaining Percentage: 95.5 %
Battery Voltage: 3.01 V
Brady Statistic AS VS Percent: 1 %
Brady Statistic RA Percent Paced: 86 %
Brady Statistic RV Percent Paced: 99 %
Lead Channel Impedance Value: 360 Ohm
Lead Channel Impedance Value: 480 Ohm
Lead Channel Pacing Threshold Amplitude: 0.75 V
Lead Channel Pacing Threshold Amplitude: 1.375 V
Lead Channel Pacing Threshold Pulse Width: 0.4 ms
Lead Channel Setting Pacing Amplitude: 2 V
Lead Channel Setting Pacing Amplitude: 2.375
Lead Channel Setting Pacing Pulse Width: 0.5 ms
Lead Channel Setting Sensing Sensitivity: 8 mV
MDC IDC MSMT LEADCHNL RA SENSING INTR AMPL: 5 mV
MDC IDC MSMT LEADCHNL RV PACING THRESHOLD PULSEWIDTH: 0.5 ms
MDC IDC SESS DTM: 20160725060015
MDC IDC STAT BRADY AP VP PERCENT: 81 %
MDC IDC STAT BRADY AP VS PERCENT: 1 %
MDC IDC STAT BRADY AS VP PERCENT: 19 %
Pulse Gen Model: 3242
Pulse Gen Serial Number: 7610861

## 2015-05-08 NOTE — Progress Notes (Signed)
Pacemaker remote check. Device function reviewed. Impedance, sensing, auto capture thresholds consistent with previous measurements. Histograms appropriate for patient and level of activity. All other diagnostic data reviewed and is appropriate and stable for patient. Real time/magnet EGM shows appropriate sensing and capture. No ventricular high rate episodes. 5 mode switch episodes, longest 39 minutes, atrial flutter, CHADS2VASC is at least 4. Dr [name] to review. Estimated longevity 8.9 years. Plan to follow in 3 months remotely, to see in office annually.

## 2015-05-16 ENCOUNTER — Encounter (HOSPITAL_COMMUNITY): Payer: Self-pay | Admitting: *Deleted

## 2015-05-16 ENCOUNTER — Emergency Department (HOSPITAL_COMMUNITY)
Admission: EM | Admit: 2015-05-16 | Discharge: 2015-05-16 | Disposition: A | Discharge status: Home / Self Care | Payer: Medicare Other | Attending: Emergency Medicine | Admitting: Emergency Medicine

## 2015-05-16 DIAGNOSIS — Z87891 Personal history of nicotine dependence: Secondary | ICD-10-CM | POA: Insufficient documentation

## 2015-05-16 DIAGNOSIS — I129 Hypertensive chronic kidney disease with stage 1 through stage 4 chronic kidney disease, or unspecified chronic kidney disease: Secondary | ICD-10-CM | POA: Diagnosis not present

## 2015-05-16 DIAGNOSIS — Z79899 Other long term (current) drug therapy: Secondary | ICD-10-CM | POA: Insufficient documentation

## 2015-05-16 DIAGNOSIS — I5022 Chronic systolic (congestive) heart failure: Secondary | ICD-10-CM | POA: Diagnosis not present

## 2015-05-16 DIAGNOSIS — D239 Other benign neoplasm of skin, unspecified: Secondary | ICD-10-CM | POA: Diagnosis not present

## 2015-05-16 DIAGNOSIS — Z950 Presence of cardiac pacemaker: Secondary | ICD-10-CM | POA: Insufficient documentation

## 2015-05-16 DIAGNOSIS — N189 Chronic kidney disease, unspecified: Secondary | ICD-10-CM | POA: Diagnosis not present

## 2015-05-16 DIAGNOSIS — E785 Hyperlipidemia, unspecified: Secondary | ICD-10-CM | POA: Diagnosis not present

## 2015-05-16 DIAGNOSIS — H409 Unspecified glaucoma: Secondary | ICD-10-CM | POA: Insufficient documentation

## 2015-05-16 DIAGNOSIS — L0211 Cutaneous abscess of neck: Secondary | ICD-10-CM | POA: Insufficient documentation

## 2015-05-16 DIAGNOSIS — D369 Benign neoplasm, unspecified site: Secondary | ICD-10-CM

## 2015-05-16 DIAGNOSIS — Z880 Allergy status to penicillin: Secondary | ICD-10-CM | POA: Insufficient documentation

## 2015-05-16 DIAGNOSIS — L0291 Cutaneous abscess, unspecified: Secondary | ICD-10-CM

## 2015-05-16 DIAGNOSIS — Z8719 Personal history of other diseases of the digestive system: Secondary | ICD-10-CM | POA: Insufficient documentation

## 2015-05-16 MED ORDER — OXYCODONE-ACETAMINOPHEN 5-325 MG PO TABS
2.0000 | ORAL_TABLET | Freq: Once | ORAL | Status: AC
  Administered 2015-05-16: 2 via ORAL
  Filled 2015-05-16: qty 2

## 2015-05-16 MED ORDER — LIDOCAINE-EPINEPHRINE 1 %-1:100000 IJ SOLN
20.0000 mL | Freq: Once | INTRAMUSCULAR | Status: AC
  Administered 2015-05-16: 20 mL
  Filled 2015-05-16: qty 1

## 2015-05-16 MED ORDER — OXYCODONE-ACETAMINOPHEN 5-325 MG PO TABS: 1.0000 | ORAL_TABLET | ORAL | Status: DC | PRN

## 2015-05-16 NOTE — ED Notes (Signed)
Pt states that she has had a neck abscess for over a year. States that her PCP told her not to worry about it unless it began to bother her. States it has been bothering her over the past couple days.

## 2015-05-16 NOTE — ED Provider Notes (Signed)
CSN: [id number]     Arrival date & time 05/16/15  2020 History  This chart was scribed for Delsa Grana, PA-C, working with Noemi Chapel, MD by Steva Colder, ED Scribe. The patient was seen in room TR05C/TR05C at 9:24 PM.    Chief Complaint  Patient presents with  . Abscess     The history is provided by the patient. No language interpreter was used.    HPI Comments: [name] is a 69 y.o. female who presents to the Emergency Department complaining of neck abscess onset 1 year worsening x 3 days. Pt went to her PCP who would not drain the area unless it was bothering her. Pt notes that her PCP would remove the abscess when it was bothering her. Pt has never been placed on abx for her abscess. Pt reports that the area has changed in size and it is painful with movement. She states that the area was initially hard and has since softened. She states that she has tried alcohol and OTC cream with mild relief for her symptoms. She denies fever, chills, n/v, and any other symptoms. Pt denies being on anti-coagulants at this time. Pt PCP is Dr. Carol Ada.    Past Medical History  Diagnosis Date  . NICM (nonischemic cardiomyopathy)     echo 1/11: EF 35-40%, mod LVH, Grade 1 diast dysfxn, mild BAE;    cath 2/06: luminal irregs;  Myoview 10/2:  no scar or ischemia, EF 37%   . Chronic systolic heart failure   . HTN (hypertension)   . HLD (hyperlipidemia)   . Sarcoidosis   . Complete heart block     s/p STJ dual chamber pacemaker  . Atrial tachycardia   . Diverticulosis   . CKD (chronic kidney disease)   . Iritis   . Glaucoma   . Other primary cardiomyopathies    Past Surgical History  Procedure Laterality Date  . Av node ablation    . Pacemaker insertion  1999; 12/26/13    STJ dual chamber pacemaker implanted 1999; gen change 12/2013 by Dr [name] - STJ   . Abdominal hysterectomy    . Tubal ligation    . US echocardiography      EF was 40-45% with left atrial enlargement. test was  done for possible TIA. She had mild LVH 1.3 cm   . Permanent pacemaker generator change N/A 01/02/2014    Procedure: PERMANENT PACEMAKER GENERATOR CHANGE;  Surgeon: Deboraha Sprang, MD;  Location: [facility] CATH LAB;  Service: Cardiovascular;  Laterality: N/A;   Family History  Problem Relation Age of Onset  . Heart disease Father   . Asthma Father   . Diabetes Sister   . Rheum arthritis Mother   . Colon cancer Brother   . Colon cancer Brother   . Coronary artery disease Sister   . Coronary artery disease Sister    Social History  Substance Use Topics  . Smoking status: Former Smoker -- 0.50 packs/day for 8 years    Types: Cigarettes    Quit date: 10/06/1988  . Smokeless tobacco: None  . Alcohol Use: No   OB History    No data available     Review of Systems  Constitutional: Negative for fever and chills.  Gastrointestinal: Negative for nausea and vomiting.  Skin:       Abscess to back right sided neck      Allergies  Codeine; Morphine and related; and Penicillins  Home Medications   Prior to  Admission medications   Medication Sig Start Date End Date Taking? Authorizing Provider  albuterol (PROAIR HFA) 108 (90 BASE) MCG/ACT inhaler Inhale 2 puffs into the lungs every 6 (six) hours as needed for wheezing. 08/04/12   [name], MD  bimatoprost (LUMIGAN) 0.01 % SOLN Place 1 drop into the right eye at bedtime.     Historical Provider, MD  carvedilol (COREG) 25 MG tablet TAKE 1 TABLET BY MOUTH TWICE DAILY WITH A MEAL 02/21/15   Deboraha Sprang, MD  clopidogrel (PLAVIX) 75 MG tablet Take 75 mg by mouth once.  06/29/13   Historical Provider, MD  ezetimibe (ZETIA) 10 MG tablet Take 10 mg by mouth daily.    Historical Provider, MD  famotidine (PEPCID) 20 MG tablet TAKE 1 TABLET BY MOUTH TWICE DAILY 03/09/15   Deboraha Sprang, MD  furosemide (LASIX) 20 MG tablet TAKE 1 TABLET BY MOUTH EVERY DAY AS NEEDED IF WEIGHT INCREASED BY 2 POUNDS OR MORE, INCREASED SHORTNESS OF BREATH 02/21/15   Deboraha Sprang, MD  hydroxychloroquine (PLAQUENIL) 200 MG tablet Take 200 mg by mouth 2 (two) times daily.  11/12/12   Historical Provider, MD  loteprednol (LOTEMAX) 0.5 % ophthalmic suspension Place 1 drop into both eyes 4 (four) times daily.     Historical Provider, MD  methylcellulose (ARTIFICIAL TEARS) 1 % ophthalmic solution Place 1 drop into both eyes daily as needed. For dry eyes    Historical Provider, MD  Multiple Vitamins-Minerals (HAIR/SKIN/NAILS PO) Take 2 tablets by mouth daily.    Historical Provider, MD  nitroGLYCERIN (NITROSTAT) 0.4 MG SL tablet Place 0.4 mg under the tongue every 5 (five) minutes as needed for chest pain. For chest pain    Historical Provider, MD  rosuvastatin (CRESTOR) 10 MG tablet Take 10 mg by mouth daily.    Historical Provider, MD   BP 158/79 mmHg  Pulse 76  Temp(Src) 99 F (37.2 C) (Oral)  Resp 18  SpO2 96% Physical Exam  Constitutional: She is oriented to person, place, and time. She appears well-developed and well-nourished. No distress.  HENT:  Head: Normocephalic and atraumatic.  Right Ear: External ear normal.  Left Ear: External ear normal.  Nose: Nose normal.  Mouth/Throat: Oropharynx is clear and moist. No oropharyngeal exudate.  Eyes: Conjunctivae and EOM are normal. Pupils are equal, round, and reactive to light. Right eye exhibits no discharge. Left eye exhibits no discharge. No scleral icterus.  Neck: Normal range of motion. Neck supple. No JVD present. No tracheal deviation present.  Cardiovascular: Normal rate and regular rhythm.   Pulmonary/Chest: Effort normal and breath sounds normal. No stridor. No respiratory distress.  Musculoskeletal: Normal range of motion. She exhibits no edema.  Lymphadenopathy:    She has no cervical adenopathy.  Neurological: She is alert and oriented to person, place, and time. She exhibits normal muscle tone. Coordination normal.  Skin: Skin is warm and dry. No rash noted. She is not diaphoretic. There is  erythema. No pallor.  5 cm in diameter mobile firm nodule to posterior midline neck. Mild erythema and no surrounding erythema. TTP.  Psychiatric: She has a normal mood and affect. Her behavior is normal. Judgment and thought content normal.    ED Course  Procedures (including critical care time) DIAGNOSTIC STUDIES: Oxygen Saturation is 96% on RA, nl by my interpretation.    COORDINATION OF CARE: 10:40 PM Discussed treatment plan with pt at bedside and pt agreed to plan.  EMERGENCY DEPARTMENT US SOFT TISSUE INTERPRETATION "  Study: Limited Ultrasound of the noted body part in comments below"  INDICATIONS: evaluation of nodule/abscess for I&D Multiple views of the body part are obtained with a multi-frequency linear probe  PERFORMED BY:  Myself  IMAGES ARCHIVED?: Yes  SIDE:Midline  BODY PART:Neck posterior  FINDINGS: Abcess present and Other well circumscribed circular abscess with echogenic materia in center  LIMITATIONS: none  INTERPRETATION:  No abcess noted  INCISION AND DRAINAGE Performed by: Delsa Grana Consent: Verbal consent obtained. Risks and benefits: risks, benefits and alternatives were discussed Time out performed prior to procedure Type: abscess Body area: posterior midline neck Anesthesia: local infiltration Needle aspiration with 18g needle - serous to purulent fluid return - I&D indicated Incision was made with a scalpel. Local anesthetic: lidocaine 1 % w epinephrine Anesthetic total: 8 mL Complexity: complex Blunt dissection to break up loculations, solid waxy yellow to gray material removed from abscess Drainage: purulent Drainage amount: moderate Packing material: 1/4" iodoform gauze Dressing:  Packing secured with multiple gauze layers, secured with paper tape Patient tolerance: Patient tolerated the procedure well with no immediate complications.   Labs Review Labs Reviewed - No data to display  Imaging Review No results found.   EKG  Interpretation None      MDM   Final diagnoses:  None    Pt with cyst/abscess to midline posterior neck Nodule was firm, without fluctuance, and mobile, deep Evaluated by [place] for ability to I&D, and to evaluate depth and surrounding structures Pt was anesthetized, needle aspiration first to confirm need for I&D, then incision was made- likely a dermoid or sebaceous cyst that became infected.  Packed and pt was given instruction on how to dress the wound.  Was instructed to f/up in 2 days for packing to come out and to reevaluate for need of further packing. Given pain meds prior to procedure.  Pt agrees to f/up with PCP, and return precautions given and verbally acknowledged.  Pt vitals reviewed.  Pt was d/c home.    I personally performed the services described in this documentation, which was scribed in my presence. The recorded information has been reviewed and is accurate.    Delsa Grana, PA-C 05/18/15 0301  Noemi Chapel, MD 05/21/15 [phone]

## 2015-05-16 NOTE — Discharge Instructions (Signed)
Abscess  An abscess is an infected area that contains a collection of pus and debris.It can occur in almost any part of the body. An abscess is also known as a furuncle or boil.  CAUSES   An abscess occurs when tissue gets infected. This can occur from blockage of oil or sweat glands, infection of hair follicles, or a minor injury to the skin. As the body tries to fight the infection, pus collects in the area and creates pressure under the skin. This pressure causes pain. People with weakened immune systems have difficulty fighting infections and get certain abscesses more often.   SYMPTOMS  Usually an abscess develops on the skin and becomes a painful mass that is red, warm, and tender. If the abscess forms under the skin, you may feel a moveable soft area under the skin. Some abscesses break open (rupture) on their own, but most will continue to get worse without care. The infection can spread deeper into the body and eventually into the bloodstream, causing you to feel ill.   DIAGNOSIS   Your caregiver will take your medical history and perform a physical exam. A sample of fluid may also be taken from the abscess to determine what is causing your infection.  TREATMENT   Your caregiver may prescribe antibiotic medicines to fight the infection. However, taking antibiotics alone usually does not cure an abscess. Your caregiver may need to make a small cut (incision) in the abscess to drain the pus. In some cases, gauze is packed into the abscess to reduce pain and to continue draining the area.  HOME CARE INSTRUCTIONS    Only take over-the-counter or prescription medicines for pain, discomfort, or fever as directed by your caregiver.   If you were prescribed antibiotics, take them as directed. Finish them even if you start to feel better.   If gauze is used, follow your caregiver's directions for changing the gauze.   To avoid spreading the infection:   Keep your draining abscess covered with a  bandage.   Wash your hands well.   Do not share personal care items, towels, or whirlpools with others.   Avoid skin contact with others.   Keep your skin and clothes clean around the abscess.   Keep all follow-up appointments as directed by your caregiver.  SEEK MEDICAL CARE IF:    You have increased pain, swelling, redness, fluid drainage, or bleeding.   You have muscle aches, chills, or a general ill feeling.   You have a fever.  MAKE SURE YOU:    Understand these instructions.   Will watch your condition.   Will get help right away if you are not doing well or get worse.  Document Released: 07/02/2005 Document Revised: 03/23/2012 Document Reviewed: 12/05/2011  ExitCare Patient Information 2015 [employer]. This information is not intended to replace advice given to you by your health care provider. Make sure you discuss any questions you have with your health care provider.  Incision and Drainage  Incision and drainage is a procedure in which a sac-like structure (cystic structure) is opened and drained. The area to be drained usually contains material such as pus, fluid, or blood.   LET YOUR CAREGIVER KNOW ABOUT:    Allergies to medicine.   Medicines taken, including vitamins, herbs, eyedrops, over-the-counter medicines, and creams.   Use of steroids (by mouth or creams).   Previous problems with anesthetics or numbing medicines.   History of bleeding problems or blood clots.     Previous surgery.   Other health problems, including diabetes and kidney problems.   Possibility of pregnancy, if this applies.  RISKS AND COMPLICATIONS   Pain.   Bleeding.   Scarring.   Infection.  BEFORE THE PROCEDURE   You may need to have an ultrasound or other imaging tests to see how large or deep your cystic structure is. Blood tests may also be used to determine if you have an infection or how severe the infection is. You may need to have a tetanus shot.  PROCEDURE   The affected area is cleaned with a  cleaning fluid. The cyst area will then be numbed with a medicine (local anesthetic). A small incision will be made in the cystic structure. A syringe or catheter may be used to drain the contents of the cystic structure, or the contents may be squeezed out. The area will then be flushed with a cleansing solution. After cleansing the area, it is often gently packed with a gauze or another wound dressing. Once it is packed, it will be covered with gauze and tape or some other type of wound dressing.  AFTER THE PROCEDURE    Often, you will be allowed to go home right after the procedure.   You may be given antibiotic medicine to prevent or heal an infection.   If the area was packed with gauze or some other wound dressing, you will likely need to come back in 1 to 2 days to get it removed.   The area should heal in about 14 days.  Document Released: 03/18/2001 Document Revised: 03/23/2012 Document Reviewed: 11/17/2011  ExitCare Patient Information 2015 [employer]. This information is not intended to replace advice given to you by your health care provider. Make sure you discuss any questions you have with your health care provider.

## 2015-05-22 ENCOUNTER — Encounter: Payer: Self-pay | Admitting: Cardiology

## 2015-08-07 ENCOUNTER — Ambulatory Visit (INDEPENDENT_AMBULATORY_CARE_PROVIDER_SITE_OTHER): Payer: Medicare Other | Admitting: *Deleted

## 2015-08-07 DIAGNOSIS — I442 Atrioventricular block, complete: Secondary | ICD-10-CM | POA: Diagnosis not present

## 2015-08-08 NOTE — Progress Notes (Signed)
Remote pacemaker transmission.   

## 2015-08-23 ENCOUNTER — Encounter: Payer: Self-pay | Admitting: Cardiology

## 2015-08-23 LAB — CUP PACEART REMOTE DEVICE CHECK
Battery Remaining Longevity: 100 mo
Battery Voltage: 3.01 V
Brady Statistic AS VP Percent: 15 %
Brady Statistic AS VS Percent: 1 %
Brady Statistic RA Percent Paced: 89 %
Implantable Lead Implant Date: 19990414
Implantable Lead Implant Date: 19990414
Implantable Lead Location: 753859
Lead Channel Impedance Value: 330 Ohm
Lead Channel Impedance Value: 460 Ohm
Lead Channel Pacing Threshold Amplitude: 0.75 V
Lead Channel Pacing Threshold Amplitude: 1.25 V
Lead Channel Pacing Threshold Pulse Width: 0.5 ms
Lead Channel Setting Pacing Amplitude: 2 V
Lead Channel Setting Pacing Amplitude: 2.25 V
Lead Channel Setting Pacing Pulse Width: 0.5 ms
MDC IDC LEAD LOCATION: 753860
MDC IDC MSMT BATTERY REMAINING PERCENTAGE: 95.5 %
MDC IDC MSMT LEADCHNL RA PACING THRESHOLD PULSEWIDTH: 0.4 ms
MDC IDC MSMT LEADCHNL RA SENSING INTR AMPL: 5 mV
MDC IDC SESS DTM: 20161101060013
MDC IDC SET LEADCHNL RV SENSING SENSITIVITY: 8 mV
MDC IDC STAT BRADY AP VP PERCENT: 85 %
MDC IDC STAT BRADY AP VS PERCENT: 1 %
MDC IDC STAT BRADY RV PERCENT PACED: 99 %
Pulse Gen Model: 3242
Pulse Gen Serial Number: 7610861

## 2015-10-19 ENCOUNTER — Telehealth: Payer: Self-pay | Admitting: Internal Medicine

## 2015-10-19 ENCOUNTER — Other Ambulatory Visit: Payer: Self-pay | Admitting: *Deleted

## 2015-10-19 MED ORDER — CARVEDILOL 25 MG PO TABS: ORAL_TABLET | ORAL | Status: DC

## 2015-10-19 NOTE — Telephone Encounter (Signed)
*  STAT* If patient is at the pharmacy, call can be transferred to refill team.   1. Which medications need to be refilled? (please list name of each medication and dose if known) carvedilol 25mg   2. Which pharmacy/location (including street and city if local pharmacy) is medication to be sent to? [facility]- [address]-   3. Do they need a 30 day or 90 day supply? [place]

## 2015-11-06 ENCOUNTER — Ambulatory Visit (INDEPENDENT_AMBULATORY_CARE_PROVIDER_SITE_OTHER): Payer: Medicare Other | Admitting: *Deleted

## 2015-11-06 DIAGNOSIS — I442 Atrioventricular block, complete: Secondary | ICD-10-CM | POA: Diagnosis not present

## 2015-11-06 LAB — CUP PACEART REMOTE DEVICE CHECK
Battery Remaining Longevity: 100 mo
Battery Remaining Percentage: 95.5 %
Brady Statistic AP VP Percent: 85 %
Brady Statistic AP VS Percent: 1 %
Brady Statistic RA Percent Paced: 89 %
Brady Statistic RV Percent Paced: 99 %
Date Time Interrogation Session: 20170131072658
Implantable Lead Implant Date: 19990414
Implantable Lead Location: 753859
Implantable Lead Location: 753860
Lead Channel Impedance Value: 330 Ohm
Lead Channel Pacing Threshold Amplitude: 0.75 V
Lead Channel Pacing Threshold Amplitude: 1.25 V
Lead Channel Pacing Threshold Pulse Width: 0.4 ms
Lead Channel Setting Pacing Amplitude: 2 V
Lead Channel Setting Pacing Amplitude: 2.25 V
Lead Channel Setting Sensing Sensitivity: 8 mV
MDC IDC LEAD IMPLANT DT: 19990414
MDC IDC MSMT BATTERY VOLTAGE: 2.99 V
MDC IDC MSMT LEADCHNL RA IMPEDANCE VALUE: 450 Ohm
MDC IDC MSMT LEADCHNL RA SENSING INTR AMPL: 5 mV
MDC IDC MSMT LEADCHNL RV PACING THRESHOLD PULSEWIDTH: 0.5 ms
MDC IDC PG SERIAL: 7610861
MDC IDC SET LEADCHNL RV PACING PULSEWIDTH: 0.5 ms
MDC IDC STAT BRADY AS VP PERCENT: 15 %
MDC IDC STAT BRADY AS VS PERCENT: 1 %
Pulse Gen Model: 3242

## 2015-11-06 NOTE — Progress Notes (Signed)
Remote pacemaker transmission.   

## 2015-11-30 ENCOUNTER — Encounter: Payer: Self-pay | Admitting: Cardiology

## 2015-12-07 ENCOUNTER — Encounter: Payer: Self-pay | Admitting: Internal Medicine

## 2016-01-18 ENCOUNTER — Other Ambulatory Visit: Payer: Self-pay | Admitting: Internal Medicine

## 2016-02-13 ENCOUNTER — Encounter: Payer: Self-pay | Admitting: Internal Medicine

## 2016-02-13 ENCOUNTER — Ambulatory Visit (INDEPENDENT_AMBULATORY_CARE_PROVIDER_SITE_OTHER): Payer: Medicare Other | Admitting: Internal Medicine

## 2016-02-13 ENCOUNTER — Other Ambulatory Visit: Payer: Self-pay | Admitting: Internal Medicine

## 2016-02-13 VITALS — BP 142/84 | HR 60 | Ht 66.0 in | Wt 188.4 lb

## 2016-02-13 DIAGNOSIS — I495 Sick sinus syndrome: Secondary | ICD-10-CM | POA: Diagnosis not present

## 2016-02-13 DIAGNOSIS — I442 Atrioventricular block, complete: Secondary | ICD-10-CM | POA: Diagnosis not present

## 2016-02-13 DIAGNOSIS — I5022 Chronic systolic (congestive) heart failure: Secondary | ICD-10-CM | POA: Diagnosis not present

## 2016-02-13 DIAGNOSIS — I428 Other cardiomyopathies: Secondary | ICD-10-CM

## 2016-02-13 DIAGNOSIS — Z950 Presence of cardiac pacemaker: Secondary | ICD-10-CM

## 2016-02-13 DIAGNOSIS — I429 Cardiomyopathy, unspecified: Secondary | ICD-10-CM | POA: Diagnosis not present

## 2016-02-13 MED ORDER — FUROSEMIDE 20 MG PO TABS: ORAL_TABLET | ORAL | Status: DC

## 2016-02-13 NOTE — Patient Instructions (Signed)
Medication Instructions: - Your physician recommends that you continue on your current medications as directed. Please refer to the Current Medication list given to you today.  Labwork: - none  Procedures/Testing: - none  Follow-Up: - Remote monitoring is used to monitor your Pacemaker of ICD from home. This monitoring reduces the number of office visits required to check your device to one time per year. It allows [place] to keep an eye on the functioning of your device to ensure it is working properly. You are scheduled for a device check from home on 05/14/16. You may send your transmission at any time that day. If you have a wireless device, the transmission will be sent automatically. After your physician reviews your transmission, you will receive a postcard with your next transmission date.  - Your physician wants you to follow-up in: 1 year with Dr. [name]. You will receive a reminder letter in the mail two months in advance. If you don't receive a letter, please call our office to schedule the follow-up appointment.  Any Additional Special Instructions Will Be Listed Below (If Applicable).     If you need a refill on your cardiac medications before your next appointment, please call your pharmacy.

## 2016-02-13 NOTE — Progress Notes (Signed)
Patient Care Team: Brenda Ada, MD as PCP - General (Family Medicine)   HPI  [name] is a 70 y.o. 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  Myoview scanning demonstrated ejection fraction of 37% with no scar. This was consistent with prior testing. Echo April 2013 EF 35-40%. In March 2015 it was 40-45%.   She was 100% ventricularly paced with a QRS duration of 198 msec   She has interval cough productive of sputum   Functional status is relatively stable without complaints of shortness of breath. She does have chronic fatigue  and a sleep study 4/16 demonstrated an AHI of 9.8.     Past Medical History  Diagnosis Date  . NICM (nonischemic cardiomyopathy)     echo 1/11: EF 35-40%, mod LVH, Grade 1 diast dysfxn, mild BAE;    cath 2/06: luminal irregs;  Myoview 10/2:  no scar or ischemia, EF 37%   . Chronic systolic heart failure   . HTN (hypertension)   . HLD (hyperlipidemia)   . Sarcoidosis   . Complete heart block     s/p STJ dual chamber pacemaker  . Atrial tachycardia   . Diverticulosis   . CKD (chronic kidney disease)   . Iritis   . Glaucoma   . Other primary cardiomyopathies     Past Surgical History  Procedure Laterality Date  . Av node ablation    . Pacemaker insertion  1999; 12/26/13    STJ dual chamber pacemaker implanted 1999; gen change 12/2013 by Dr [name] - STJ   . Abdominal hysterectomy    . Tubal ligation    . US echocardiography      EF was 40-45% with left atrial enlargement. test was done for possible TIA. She had mild LVH 1.3 cm   . Permanent pacemaker generator change N/A 01/02/2014    Procedure: PERMANENT PACEMAKER GENERATOR CHANGE;  Surgeon: Brenda Sprang, MD;  Location: [facility] CATH LAB;  Service: Cardiovascular;  Laterality: N/A;    Current Outpatient Prescriptions  Medication Sig Dispense Refill  . albuterol (PROAIR HFA) 108 (90 BASE) MCG/ACT  inhaler Inhale 2 puffs into the lungs every 6 (six) hours as needed for wheezing. 1 Inhaler 3  . bimatoprost (LUMIGAN) 0.01 % SOLN Place 1 drop into the right eye at bedtime.     . Biotin (BIOTIN 5000) 5 MG CAPS Take 500 mg by mouth daily.    . carvedilol (COREG) 25 MG tablet TAKE 1 TABLET BY MOUTH TWICE DAILY WITH A MEAL 180 tablet 0  . clopidogrel (PLAVIX) 75 MG tablet Take 75 mg by mouth once.     . ezetimibe (ZETIA) 10 MG tablet Take 10 mg by mouth daily.    . famotidine (PEPCID) 20 MG tablet TAKE 1 TABLET BY MOUTH TWICE DAILY 60 tablet 6  . furosemide (LASIX) 20 MG tablet TAKE 1 TABLET BY MOUTH EVERY DAY AS NEEDED IF WEIGHT INCREASED BY 2 POUNDS OR MORE, INCREASED SHORTNESS OF BREATH 30 tablet 6  . hydroxychloroquine (PLAQUENIL) 200 MG tablet Take 200 mg by mouth 2 (two) times daily.     Marland Kitchen losartan (COZAAR) 25 MG tablet Take 1 tablet by mouth daily.    Marland Kitchen loteprednol (LOTEMAX) 0.5 % ophthalmic suspension Place 1 drop into both eyes 4 (four) times daily.     . meloxicam (MOBIC) 15 MG tablet Take 1 tablet by mouth daily.  0  . methylcellulose (ARTIFICIAL TEARS) 1 % ophthalmic solution Place 1 drop into both eyes daily as needed. For dry eyes    . Multiple Vitamins-Minerals (HAIR/SKIN/NAILS PO) Take 2 tablets by mouth daily.    . nitroGLYCERIN (NITROSTAT) 0.4 MG SL tablet Place 0.4 mg under the tongue every 5 (five) minutes as needed for chest pain. For chest pain    . oxyCODONE-acetaminophen (PERCOCET) 5-325 MG per tablet Take 1 tablet by mouth every 4 (four) hours as needed. 6 tablet 0  . rosuvastatin (CRESTOR) 10 MG tablet Take 10 mg by mouth daily.     No current facility-administered medications for this visit.    Allergies  Allergen Reactions  . Codeine Itching  . Morphine And Related     Throat closing  . Penicillins Itching    Review of Systems negative except from HPI and PMH  Physical Exam BP 162/92 mmHg  Pulse 60  Ht 5\' 6"  (1.676 m)  Wt 188 lb 6.4 oz (85.458 kg)  BMI  30.42 kg/m2  SpO2 96% Well developed and nourished in no acute distress HENT normal Neck supple with JVP-flat Clear Regular rate and rhythm, no murmurs or gallops Abd-soft with active BS No Clubbing cyanosis edema Skin-warm and dry A & Oriented  Grossly normal sensory and motor function Device pocket well healed; without hematoma or erythema.  There is no tethering  ECG demonstrates AV pacing 60  Assessment and  Plan  Nonischemic cardiomyopathy   Sarcoid heart disease   Hypertension  Complete heart block   Renal Insufficiency  Grade 4   Sinus node dysfunction  Congestive heart failure-class II  [name] for a second blood pressure and for [name] [id number] Place thank you 8 a think if this is declined  Pacemaker  [employer] Jude The patient's device was interrogated and the information was fully reviewed.  The device was reprogrammed to  activate rate response  No interval Labs here but renal function is followed by [name].  She is euvolemic  we'll continue current medications.   Repeat blood pressure was 142 continue current medications  She has had interval atrial fibrillation -- CHADSVASc score prompts need for anticoagulation the value attenuated by renal dysfunction and plaquenil  Will speak with her PCP

## 2016-02-19 ENCOUNTER — Telehealth: Payer: Self-pay | Admitting: *Deleted

## 2016-02-19 DIAGNOSIS — I480 Paroxysmal atrial fibrillation: Secondary | ICD-10-CM

## 2016-02-19 LAB — CUP PACEART INCLINIC DEVICE CHECK
Battery Remaining Longevity: 96 mo
Battery Voltage: 3.01 V
Date Time Interrogation Session: 20170510163552
Implantable Lead Implant Date: 19990414
Implantable Lead Location: 753860
Lead Channel Impedance Value: 325 Ohm
Lead Channel Pacing Threshold Pulse Width: 0.4 ms
Lead Channel Pacing Threshold Pulse Width: 0.5 ms
Lead Channel Setting Pacing Amplitude: 2 V
Lead Channel Setting Pacing Amplitude: 2 V
Lead Channel Setting Pacing Pulse Width: 0.5 ms
Lead Channel Setting Sensing Sensitivity: 8 mV
MDC IDC LEAD IMPLANT DT: 19990414
MDC IDC LEAD LOCATION: 753859
MDC IDC MSMT LEADCHNL RA IMPEDANCE VALUE: 450 Ohm
MDC IDC MSMT LEADCHNL RA PACING THRESHOLD AMPLITUDE: 0.75 V
MDC IDC MSMT LEADCHNL RA SENSING INTR AMPL: 5 mV
MDC IDC MSMT LEADCHNL RV PACING THRESHOLD AMPLITUDE: 1 V
MDC IDC PG MODEL: 3242
MDC IDC STAT BRADY RA PERCENT PACED: 89 %
MDC IDC STAT BRADY RV PERCENT PACED: 99.99 %
Pulse Gen Serial Number: 7610861

## 2016-02-19 MED ORDER — APIXABAN 5 MG PO TABS: 5.0000 mg | ORAL_TABLET | Freq: Two times a day (BID) | ORAL | Status: DC

## 2016-02-19 NOTE — Telephone Encounter (Signed)
I spoke with the patient. She is aware to start Eliquis 5 mg BID. Samples pulled and placed at the front desk for her along with a 30-day free trial card and RX. She will come on 03/24/16 for follow up labs- BMP/ CBC

## 2016-02-19 NOTE — Telephone Encounter (Signed)
-----   Message from Deboraha Sprang, MD sent at 02/18/2016  6:04 PM EDT ----- Spoke with her PCP. Most recent creatinine 1.6. Go ahead and start her on apixoban 5 mg twice daily. Weight is 88 kg age 71 thanks

## 2016-02-29 ENCOUNTER — Other Ambulatory Visit: Payer: Self-pay | Admitting: Physical Medicine and Rehabilitation

## 2016-02-29 DIAGNOSIS — M5412 Radiculopathy, cervical region: Secondary | ICD-10-CM

## 2016-03-11 ENCOUNTER — Ambulatory Visit
Admission: RE | Admit: 2016-03-11 | Discharge: 2016-03-11 | Disposition: A | Discharge status: Home / Self Care | Payer: Medicare Other | Source: Ambulatory Visit | Attending: Physical Medicine and Rehabilitation | Admitting: Physical Medicine and Rehabilitation

## 2016-03-11 DIAGNOSIS — M5412 Radiculopathy, cervical region: Secondary | ICD-10-CM

## 2016-03-24 ENCOUNTER — Other Ambulatory Visit (INDEPENDENT_AMBULATORY_CARE_PROVIDER_SITE_OTHER): Payer: Medicare Other | Admitting: *Deleted

## 2016-03-24 ENCOUNTER — Other Ambulatory Visit: Payer: Self-pay | Admitting: Internal Medicine

## 2016-03-24 DIAGNOSIS — I480 Paroxysmal atrial fibrillation: Secondary | ICD-10-CM | POA: Diagnosis not present

## 2016-03-24 LAB — CBC WITH DIFFERENTIAL/PLATELET

## 2016-03-24 LAB — BASIC METABOLIC PANEL
BUN: 25 mg/dL (ref 7–25)
CHLORIDE: 109 mmol/L (ref 98–110)
CO2: 24 mmol/L (ref 20–31)
CREATININE: 1.82 mg/dL — AB (ref 0.50–0.99)
Calcium: 9.5 mg/dL (ref 8.6–10.4)
Glucose, Bld: 90 mg/dL (ref 65–99)
POTASSIUM: 5.2 mmol/L (ref 3.5–5.3)
Sodium: 141 mmol/L (ref 135–146)

## 2016-03-25 LAB — CBC WITH DIFFERENTIAL/PLATELET
BASOS ABS: 0 {cells}/uL (ref 0–200)
Basophils Relative: 0 %
EOS PCT: 11 %
Eosinophils Absolute: 506 cells/uL — ABNORMAL HIGH (ref 15–500)
HCT: 35.7 % (ref 35.0–45.0)
HEMOGLOBIN: 11.3 g/dL — AB (ref 11.7–15.5)
LYMPHS ABS: 1150 {cells}/uL (ref 850–3900)
Lymphocytes Relative: 25 %
MCH: 28.8 pg (ref 27.0–33.0)
MCHC: 31.7 g/dL — ABNORMAL LOW (ref 32.0–36.0)
MCV: 91.1 fL (ref 80.0–100.0)
MONO ABS: 368 {cells}/uL (ref 200–950)
MPV: 10.5 fL (ref 7.5–12.5)
Monocytes Relative: 8 %
NEUTROS ABS: 2576 {cells}/uL (ref 1500–7800)
NEUTROS PCT: 56 %
Platelets: 214 10*3/uL (ref 140–400)
RBC: 3.92 MIL/uL (ref 3.80–5.10)
RDW: 13.9 % (ref 11.0–15.0)
WBC: 4.6 10*3/uL (ref 3.8–10.8)

## 2016-04-16 ENCOUNTER — Telehealth: Payer: Self-pay

## 2016-04-16 NOTE — Telephone Encounter (Signed)
Prior auth obtained for Eliquis 5mg  through [facility]. [id number]. Local pharmacy notified.

## 2016-04-29 ENCOUNTER — Other Ambulatory Visit: Payer: Self-pay | Admitting: Internal Medicine

## 2016-05-14 ENCOUNTER — Ambulatory Visit (INDEPENDENT_AMBULATORY_CARE_PROVIDER_SITE_OTHER): Payer: Medicare Other | Admitting: *Deleted

## 2016-05-14 DIAGNOSIS — I495 Sick sinus syndrome: Secondary | ICD-10-CM | POA: Diagnosis not present

## 2016-05-14 DIAGNOSIS — I442 Atrioventricular block, complete: Secondary | ICD-10-CM

## 2016-05-14 NOTE — Progress Notes (Signed)
Remote pacemaker transmission.   

## 2016-05-15 ENCOUNTER — Encounter: Payer: Self-pay | Admitting: Cardiology

## 2016-05-21 LAB — CUP PACEART REMOTE DEVICE CHECK
Battery Voltage: 2.99 V
Brady Statistic AP VP Percent: 87 %
Brady Statistic AS VP Percent: 13 %
Brady Statistic AS VS Percent: 1 %
Brady Statistic RV Percent Paced: 99 %
Date Time Interrogation Session: 20170809140951
Implantable Lead Implant Date: 19990414
Implantable Lead Location: 753859
Lead Channel Impedance Value: 330 Ohm
Lead Channel Impedance Value: 450 Ohm
Lead Channel Pacing Threshold Pulse Width: 0.4 ms
Lead Channel Pacing Threshold Pulse Width: 0.5 ms
Lead Channel Sensing Intrinsic Amplitude: 5 mV
Lead Channel Setting Pacing Amplitude: 2.375
Lead Channel Setting Pacing Pulse Width: 0.5 ms
MDC IDC LEAD IMPLANT DT: 19990414
MDC IDC LEAD LOCATION: 753860
MDC IDC MSMT BATTERY REMAINING LONGEVITY: 100 mo
MDC IDC MSMT BATTERY REMAINING PERCENTAGE: 95.5 %
MDC IDC MSMT LEADCHNL RA PACING THRESHOLD AMPLITUDE: 0.75 V
MDC IDC MSMT LEADCHNL RV PACING THRESHOLD AMPLITUDE: 1.375 V
MDC IDC SET LEADCHNL RA PACING AMPLITUDE: 2 V
MDC IDC SET LEADCHNL RV SENSING SENSITIVITY: 8 mV
MDC IDC STAT BRADY AP VS PERCENT: 1 %
MDC IDC STAT BRADY RA PERCENT PACED: 90 %
Pulse Gen Model: 3242
Pulse Gen Serial Number: 7610861

## 2016-07-29 ENCOUNTER — Encounter: Payer: Self-pay | Admitting: Pulmonary Disease

## 2016-07-29 ENCOUNTER — Ambulatory Visit (INDEPENDENT_AMBULATORY_CARE_PROVIDER_SITE_OTHER): Payer: Medicare Other | Admitting: Pulmonary Disease

## 2016-07-29 ENCOUNTER — Ambulatory Visit (INDEPENDENT_AMBULATORY_CARE_PROVIDER_SITE_OTHER)
Admission: RE | Admit: 2016-07-29 | Discharge: 2016-07-29 | Disposition: A | Discharge status: Home / Self Care | Payer: Medicare Other | Source: Ambulatory Visit | Attending: Pulmonary Disease | Admitting: Pulmonary Disease

## 2016-07-29 VITALS — BP 140/86 | HR 61 | Ht 63.0 in | Wt 190.8 lb

## 2016-07-29 DIAGNOSIS — D869 Sarcoidosis, unspecified: Secondary | ICD-10-CM

## 2016-07-29 DIAGNOSIS — R059 Cough, unspecified: Secondary | ICD-10-CM

## 2016-07-29 DIAGNOSIS — G4733 Obstructive sleep apnea (adult) (pediatric): Secondary | ICD-10-CM

## 2016-07-29 DIAGNOSIS — R05 Cough: Secondary | ICD-10-CM | POA: Diagnosis not present

## 2016-07-29 DIAGNOSIS — R058 Other specified cough: Secondary | ICD-10-CM

## 2016-07-29 NOTE — Progress Notes (Signed)
Past surgical history She  has a past surgical history that includes AV node ablation; Pacemaker insertion (1999; 12/26/13); Abdominal hysterectomy; Tubal ligation; US ECHOCARDIOGRAPHY; and Permanent pacemaker generator change (N/A, 01/02/2014).  Family history Her family history includes Asthma in her father; Colon cancer in her brother and brother; Coronary artery disease in her sister and sister; Diabetes in her sister; Heart disease in her father; Rheum arthritis in her mother.  Social history She  reports that she quit smoking about 27 years ago. Her smoking use included Cigarettes. She has a 4.00 pack-year smoking history. She has never used smokeless tobacco. She reports that she does not drink alcohol or use drugs.  Allergies  Allergen Reactions  . Codeine Itching  . Morphine And Related     Throat closing  . Penicillins Itching   Review of Systems  Constitutional: Negative for fever and unexpected weight change.  HENT: Negative for congestion, dental problem, ear pain, nosebleeds, postnasal drip, rhinorrhea, sinus pressure, sneezing, sore throat and trouble swallowing.   Eyes: Negative for redness and itching.  Respiratory: Positive for cough. Negative for chest tightness, shortness of breath and wheezing.   Cardiovascular: Negative for palpitations and leg swelling.  Gastrointestinal: Negative for nausea and vomiting.  Genitourinary: Negative for dysuria.  Musculoskeletal: Negative for joint swelling.  Skin: Negative for rash.  Neurological: Negative for headaches.  Hematological: Does not bruise/bleed easily.  Psychiatric/Behavioral: Negative for dysphoric mood. The patient is not nervous/anxious.    Current Outpatient Prescriptions on File Prior to Visit  Medication Sig  . albuterol (PROAIR HFA) 108 (90 BASE) MCG/ACT inhaler Inhale 2 puffs into the lungs every 6 (six) hours as needed for wheezing.  Marland Kitchen apixaban (ELIQUIS) 5 MG TABS tablet Take 1 tablet (5 mg total) by mouth 2  (two) times daily.  . bimatoprost (LUMIGAN) 0.01 % SOLN Place 1 drop into the right eye at bedtime.   . Biotin (BIOTIN 5000) 5 MG CAPS Take 500 mg by mouth daily.  . carvedilol (COREG) 25 MG tablet TAKE 1 TABLET BY MOUTH TWICE DAILY WITH A MEAL  . clopidogrel (PLAVIX) 75 MG tablet Take 75 mg by mouth once.   . ezetimibe (ZETIA) 10 MG tablet Take 10 mg by mouth daily.  . famotidine (PEPCID) 20 MG tablet TAKE 1 TABLET BY MOUTH TWICE DAILY  . furosemide (LASIX) 20 MG tablet TAKE 1 TABLET FOR PAIN EVERY DAY AS NEEDED IF WEIGHT INCREASED BY 2 POUNDS OR MORE, INCREASED SHORTNESS OF BREATH  . hydroxychloroquine (PLAQUENIL) 200 MG tablet Take 200 mg by mouth 2 (two) times daily.   Marland Kitchen loteprednol (LOTEMAX) 0.5 % ophthalmic suspension Place 1 drop into both eyes 4 (four) times daily.   . methylcellulose (ARTIFICIAL TEARS) 1 % ophthalmic solution Place 1 drop into both eyes daily as needed. For dry eyes  . Multiple Vitamins-Minerals (HAIR/SKIN/NAILS PO) Take 2 tablets by mouth daily.  . nitroGLYCERIN (NITROSTAT) 0.4 MG SL tablet Place 0.4 mg under the tongue every 5 (five) minutes as needed for chest pain. For chest pain  . oxyCODONE-acetaminophen (PERCOCET) 5-325 MG per tablet Take 1 tablet by mouth every 4 (four) hours as needed.   No current facility-administered medications on file prior to visit.     Chief Complaint  Patient presents with  . Pulmonary Consult    Former [facility] patient, last seen 2014 for Sarcoid. Pt states that she has had a cough for several months, dry hacky cough - here to [employer].     Cardiac tests  Echo 12/26/13 >> EF 40 to 45%, grade 2 DD  Pulmonary tests Lung biopsy 1984 >> consistent with sarcoidosis PFT 02/13/12>>FEV1 1.03 (49%), FEV1% 76, TLC 3.30 (71%), DLCO 49%, positive BD response.  02/16/12 PPD negative  04/28/12 Start MTX >> stopped November 2013 due to pruritis PFT 08/10/12>>FEV1 1.08 (52%), FEV1% 70, TLC 2.77 (59%), DLCO 44%, no BD PFT 04/21/13 >> FEV1 1.05  (55%), FEV1% 75, TLC 2.70 (55%), DLCO 63%, no BD   CT chests CT chest 01/21/12>>b/l hilar and mediastinal nodes, diffuse peribronchovascular and perifissural nodularity with mild associated septal thickening. CT chest 08/06/12>>mediastinal/hilar LAN, diffuse peribronchovascular and perfissural nodularity  Sleep tests PSG 06/19/14 >> AHI 9.1, SpO2 low 74%  Past medical history She  has a past medical history of Atrial tachycardia ([name]); Chronic systolic heart failure ([name]); CKD (chronic kidney disease); Complete heart block ([place]); Diverticulosis; Glaucoma; HLD (hyperlipidemia); HTN (hypertension); Iritis; NICM (nonischemic cardiomyopathy) ([name]); Other primary cardiomyopathies; and Sarcoidosis ([name]).  Vital signs BP 140/86 (BP Location: Left Arm, Cuff Size: Normal)   Pulse 61   Ht 5\' 3"  (1.6 m)   Wt 190 lb 12.8 oz (86.5 kg)   SpO2 99%   BMI 33.80 kg/m   History of present illness [name] is a 70 y.o. female with cough and history of systemic sarcoidosis.  I last saw her in June 2014.  She was previously on MTX, but this was stopped due to pruritis.  She has been followed by Dr. Lenna Kline, and has been on plaquenil.    She comes back today because she has persistent cough.  She feels like something is stuck in her throat and she tries to cough this up.  She sometimes brings up sputum that is clear.  She gets allergies, and has sinus drainage at times.  Her cough happens mostly at night when she is laying flat.  She had sleep study in 2015 >> this showed mild sleep apnea, but she is not aware of any follow up after sleep study was done.  She does still have snoring and wakes up with a cough.  She has an albuterol inhaler, but this doesn't seem to help.  She is not having trouble with reflux.  She denies hemoptysis, wheeze, chest pain, palpitations, skin rash, joint pain, leg swelling, or gland swelling.  She does not feel like her breathing limits her activities.  She is from [place], but also lived in [place] for 10 years.  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  She had pneumonia 4 years ago.  She is not aware of having tuberculosis.  She denies any significant occupational or animal exposures.  She smoked 1/2 pack per day, and quit in 1990.  Physical exam  General - No distress ENT - No sinus tenderness, clear nasal drainage, TM clear b/l,  no oral exudate, no LAN, no thyromegaly, TM clear, pupils equal/reactive, scalloped tongue, MP 4 Cardiac - s1s2 regular, no murmur, pulses symmetric Chest - No wheeze/rales/dullness, good air entry, normal respiratory excursion Back - No focal tenderness Abd - Soft, non-tender, no organomegaly, + bowel sounds Ext - No edema Neuro - Normal strength, cranial nerves intact Skin - No rashes Psych - Normal mood, and behavior   CMP Latest Ref Rng & Units 03/24/2016 12/26/2013 11/14/2011  Glucose 65 - 99 mg/dL 90 83 82  BUN 7 - 25 mg/dL 25 35(H) 27(H)  Creatinine 0.50 - 0.99 mg/dL 1.82(H) 2.3(H) 1.7(H)  Sodium 135 - 146  mmol/L 141 140 137  Potassium 3.5 - 5.3 mmol/L 5.2 4.5 4.6  Chloride 98 - 110 mmol/L 109 105 105  CO2 20 - 31 mmol/L 24 26 28   Calcium 8.6 - 10.4 mg/dL 9.5 10.0 9.7  Total Protein 6.0 - 8.3 g/dL - - -  Total Bilirubin 0.3 - 1.2 mg/dL - - -  Alkaline Phos 39 - 117 U/L - - -  AST 0 - 37 U/L - - -  ALT 0 - 35 U/L - - -     CBC Latest Ref Rng & Units 03/24/2016 03/24/2016 12/26/2013  WBC 3.8 - 10.8 K/uL 4.6 CANCELED 4.4(L)  Hemoglobin 11.7 - 15.5 g/dL 11.3(L) CANCELED 11.3(L)  Hematocrit 35.0 - 45.0 % 35.7 CANCELED 34.2(L)  Platelets 140 - 400 K/uL 214 CANCELED 163.0    Discussion She has persistent cough.  Her symptoms happen mostly at night.  She reports globus sensation, and has post nasal drip.  She also has untreated obstructive sleep apnea.  I think her current cough symptoms are related to upper airway cough syndrome with post nasal drip and untreated OSA.  She does have history of  pulmonary sarcoidosis, but this seems stable clinically.  I am less concerned that her sarcoidosis has flared up and causing her current symptoms of cough.  Assessment/plan  Upper airway cough syndrome. - will have her use nasal irrigation and flonase - advised her to avoid forcing cough  Obstructive sleep apnea. - will arrange for home sleep study to further assess  Hx of pulmonary sarcoidosis. - will check chest xray today   Patient Instructions  Saline nasal spray daily Flonase 1 spray each nostril daily  Chest xray today  Will schedule home sleep study  Will call to schedule follow up after home sleep study reviewed    [name], MD [facility] Pager:  [phone] 07/29/2016, 11:21 AM

## 2016-07-29 NOTE — Patient Instructions (Signed)
Saline nasal spray daily Flonase 1 spray each nostril daily  Chest xray today  Will schedule home sleep study  Will call to schedule follow up after home sleep study reviewed

## 2016-07-29 NOTE — Progress Notes (Signed)
   Subjective:    Patient ID: [name], female    DOB: 06-Jan-1946, 70 y.o.   MRN: [id number]  HPI    Review of Systems  Constitutional: Negative for fever and unexpected weight change.  HENT: Negative for congestion, dental problem, ear pain, nosebleeds, postnasal drip, rhinorrhea, sinus pressure, sneezing, sore throat and trouble swallowing.   Eyes: Negative for redness and itching.  Respiratory: Positive for cough. Negative for chest tightness, shortness of breath and wheezing.   Cardiovascular: Negative for palpitations and leg swelling.  Gastrointestinal: Negative for nausea and vomiting.  Genitourinary: Negative for dysuria.  Musculoskeletal: Negative for joint swelling.  Skin: Negative for rash.  Neurological: Negative for headaches.  Hematological: Does not bruise/bleed easily.  Psychiatric/Behavioral: Negative for dysphoric mood. The patient is not nervous/anxious.        Objective:   Physical Exam        Assessment & Plan:

## 2016-08-01 ENCOUNTER — Telehealth: Payer: Self-pay | Admitting: Pulmonary Disease

## 2016-08-01 NOTE — Telephone Encounter (Signed)
Dg Chest 2 View  Result Date: 07/29/2016 CLINICAL DATA:  Cough.  Sarcoidosis. EXAM: CHEST  2 VIEW COMPARISON:  Radiographs of November 11, 2011. FINDINGS: Stable cardiomediastinal silhouette. No pneumothorax or pleural effusion is noted. Left-sided pacemaker is unchanged in position. Both lungs are clear. The visualized skeletal structures are unremarkable. IMPRESSION: No active cardiopulmonary disease. Electronically Signed   By: Marijo Conception, M.D.   On: 07/29/2016 14:13     Will have my nurse inform pt that CXR looked okay.  No evidence that sarcoidosis is active issue.

## 2016-08-06 NOTE — Telephone Encounter (Signed)
Pt is aware of results. Nothing further was needed. 

## 2016-08-06 NOTE — Telephone Encounter (Signed)
Pt returning call.[name] ° °

## 2016-08-06 NOTE — Telephone Encounter (Signed)
lmtcb x1 for pt. 

## 2016-08-13 ENCOUNTER — Ambulatory Visit (INDEPENDENT_AMBULATORY_CARE_PROVIDER_SITE_OTHER): Payer: Medicare Other | Admitting: *Deleted

## 2016-08-13 DIAGNOSIS — I495 Sick sinus syndrome: Secondary | ICD-10-CM

## 2016-08-13 NOTE — Progress Notes (Signed)
Remote pacemaker transmission.   

## 2016-08-14 ENCOUNTER — Encounter: Payer: Self-pay | Admitting: Cardiology

## 2016-08-20 DIAGNOSIS — G4733 Obstructive sleep apnea (adult) (pediatric): Secondary | ICD-10-CM | POA: Diagnosis not present

## 2016-08-21 ENCOUNTER — Encounter: Payer: Self-pay | Admitting: Pulmonary Disease

## 2016-08-21 ENCOUNTER — Telehealth: Payer: Self-pay | Admitting: Pulmonary Disease

## 2016-08-21 DIAGNOSIS — G4733 Obstructive sleep apnea (adult) (pediatric): Secondary | ICD-10-CM

## 2016-08-21 HISTORY — DX: Obstructive sleep apnea (adult) (pediatric): G47.33

## 2016-08-21 NOTE — Telephone Encounter (Signed)
HST 08/20/16 >> AHI 13.6, SaO2 low 75%   Will have my nurse inform pt that sleep study shows mild sleep apnea.  Options are 1) CPAP now, 2) ROV first.  If She is agreeable to CPAP, then please send order for auto CPAP range 5 to 15 cm H2O with heated humidity and mask of choice.  Have download sent 1 month after starting CPAP and set up ROV 2 months after starting CPAP.  ROV can be with me or NP.

## 2016-08-22 ENCOUNTER — Other Ambulatory Visit: Payer: Self-pay | Admitting: *Deleted

## 2016-08-22 DIAGNOSIS — G4733 Obstructive sleep apnea (adult) (pediatric): Secondary | ICD-10-CM

## 2016-09-04 NOTE — Telephone Encounter (Signed)
LM x 1 

## 2016-09-09 LAB — CUP PACEART REMOTE DEVICE CHECK
Battery Remaining Longevity: 100 mo
Battery Remaining Percentage: 95.5 %
Battery Voltage: 2.99 V
Brady Statistic AP VS Percent: 1 %
Brady Statistic AS VS Percent: 1 %
Date Time Interrogation Session: 20171108135103
Implantable Lead Implant Date: 19990414
Implantable Lead Location: 753859
Implantable Lead Location: 753860
Implantable Pulse Generator Implant Date: 20150330
Lead Channel Impedance Value: 460 Ohm
Lead Channel Pacing Threshold Amplitude: 0.75 V
Lead Channel Pacing Threshold Amplitude: 1.375 V
Lead Channel Pacing Threshold Pulse Width: 0.5 ms
Lead Channel Sensing Intrinsic Amplitude: 5 mV
Lead Channel Setting Pacing Amplitude: 2 V
Lead Channel Setting Pacing Pulse Width: 0.5 ms
Lead Channel Setting Sensing Sensitivity: 8 mV
MDC IDC LEAD IMPLANT DT: 19990414
MDC IDC MSMT LEADCHNL RA PACING THRESHOLD PULSEWIDTH: 0.4 ms
MDC IDC MSMT LEADCHNL RV IMPEDANCE VALUE: 340 Ohm
MDC IDC PG SERIAL: 7610861
MDC IDC SET LEADCHNL RV PACING AMPLITUDE: 2.375
MDC IDC STAT BRADY AP VP PERCENT: 88 %
MDC IDC STAT BRADY AS VP PERCENT: 12 %
MDC IDC STAT BRADY RA PERCENT PACED: 91 %
MDC IDC STAT BRADY RV PERCENT PACED: 99 %
Pulse Gen Model: 3242

## 2016-09-26 ENCOUNTER — Telehealth: Payer: Self-pay | Admitting: Pulmonary Disease

## 2016-09-26 NOTE — Telephone Encounter (Signed)
LMTCB

## 2016-09-26 NOTE — Telephone Encounter (Signed)
See the phone note from 08/2016. Nothing further is needed.

## 2016-09-26 NOTE — Telephone Encounter (Signed)
Pt called back and she is aware of sleep study results. Pt chose to have an appt first with [facility] to discuss.  appt has been scheduled for feb 2018.  Nothing further is needed

## 2016-11-12 ENCOUNTER — Ambulatory Visit (INDEPENDENT_AMBULATORY_CARE_PROVIDER_SITE_OTHER): Payer: Medicare Other | Admitting: *Deleted

## 2016-11-12 DIAGNOSIS — I495 Sick sinus syndrome: Secondary | ICD-10-CM | POA: Diagnosis not present

## 2016-11-12 NOTE — Progress Notes (Signed)
Remote pacemaker transmission.   

## 2016-11-14 ENCOUNTER — Encounter: Payer: Self-pay | Admitting: Cardiology

## 2016-11-17 ENCOUNTER — Encounter: Payer: Self-pay | Admitting: Pulmonary Disease

## 2016-11-17 ENCOUNTER — Ambulatory Visit (INDEPENDENT_AMBULATORY_CARE_PROVIDER_SITE_OTHER): Payer: Medicare Other | Admitting: Pulmonary Disease

## 2016-11-17 VITALS — BP 102/70 | HR 69 | Ht 63.0 in | Wt 192.4 lb

## 2016-11-17 DIAGNOSIS — G4733 Obstructive sleep apnea (adult) (pediatric): Secondary | ICD-10-CM

## 2016-11-17 DIAGNOSIS — R058 Other specified cough: Secondary | ICD-10-CM

## 2016-11-17 DIAGNOSIS — R05 Cough: Secondary | ICD-10-CM

## 2016-11-17 DIAGNOSIS — D869 Sarcoidosis, unspecified: Secondary | ICD-10-CM | POA: Diagnosis not present

## 2016-11-17 NOTE — Progress Notes (Signed)
Current Outpatient Prescriptions on File Prior to Visit  Medication Sig  . albuterol (PROAIR HFA) 108 (90 BASE) MCG/ACT inhaler Inhale 2 puffs into the lungs every 6 (six) hours as needed for wheezing.  Marland Kitchen apixaban (ELIQUIS) 5 MG TABS tablet Take 1 tablet (5 mg total) by mouth 2 (two) times daily.  . bimatoprost (LUMIGAN) 0.01 % SOLN Place 1 drop into the right eye at bedtime.   . Biotin (BIOTIN 5000) 5 MG CAPS Take 500 mg by mouth daily.  . carvedilol (COREG) 25 MG tablet TAKE 1 TABLET BY MOUTH TWICE DAILY WITH A MEAL  . clopidogrel (PLAVIX) 75 MG tablet Take 75 mg by mouth once.   . ezetimibe (ZETIA) 10 MG tablet Take 10 mg by mouth daily.  . famotidine (PEPCID) 20 MG tablet TAKE 1 TABLET BY MOUTH TWICE DAILY  . furosemide (LASIX) 20 MG tablet TAKE 1 TABLET FOR PAIN EVERY DAY AS NEEDED IF WEIGHT INCREASED BY 2 POUNDS OR MORE, INCREASED SHORTNESS OF BREATH  . hydroxychloroquine (PLAQUENIL) 200 MG tablet Take 200 mg by mouth 2 (two) times daily.   Marland Kitchen loteprednol (LOTEMAX) 0.5 % ophthalmic suspension Place 1 drop into both eyes 4 (four) times daily.   . methylcellulose (ARTIFICIAL TEARS) 1 % ophthalmic solution Place 1 drop into both eyes daily as needed. For dry eyes  . Multiple Vitamins-Minerals (HAIR/SKIN/NAILS PO) Take 2 tablets by mouth daily.  . nitroGLYCERIN (NITROSTAT) 0.4 MG SL tablet Place 0.4 mg under the tongue every 5 (five) minutes as needed for chest pain. For chest pain  . oxyCODONE-acetaminophen (PERCOCET) 5-325 MG per tablet Take 1 tablet by mouth every 4 (four) hours as needed.   No current facility-administered medications on file prior to visit.      Chief Complaint  Patient presents with  . Follow-up    Discuss sleep study/OSA treatment.     Cardiac tests Echo 12/26/13 >> EF 40 to 45%, grade 2 DD  Pulmonary tests Lung biopsy 1984 >> consistent with sarcoidosis PFT 02/13/12>>FEV1 1.03 (49%), FEV1% 76, TLC 3.30 (71%), DLCO 49%, positive BD response.  05/13/13PPD  negative  07/24/13Start MTX >>stopped November 2013 due to pruritis PFT 08/10/12>>FEV1 1.08 (52%), FEV1% 70, TLC 2.77 (59%), DLCO 44%, no BD PFT 04/21/13>>FEV1 1.05 (55%), FEV1% 75, TLC 2.70 (55%), DLCO 63%, no BD   CT chests CT chest 01/21/12>>b/l hilar and mediastinal nodes, diffuse peribronchovascular and perifissural nodularity with mild associated septal thickening. CT chest 08/06/12>>mediastinal/hilar LAN, diffuse peribronchovascular and perfissural nodularity  Sleep tests PSG 06/19/14 >> AHI 9.1, SpO2 low 74% HST 08/20/16 >> AHI 13.6, SaO2 low 75%  Past medical history Atrial tachycardia, systolic CHF, diastolic CHF, CKD, CHB s/p PM, Glaucoma, HLD, HTN  Past surgical history, Family history, Social history, Allergies all reviewed  Vital Signs BP 102/70 (BP Location: Left Arm, Cuff Size: Normal)   Pulse 69   Ht 5\' 3"  (1.6 m)   Wt 192 lb 6.4 oz (87.3 kg)   SpO2 96%   BMI 34.08 kg/m   History of Present Illness [name] is a 71 y.o. female former smoker with cough, UACS, sarcoidosis, OSA.  She is here to review home sleep study.  This showed mild to moderate sleep apnea.  She doesn't want to try CPAP.    She is not having as much cough. She denies wheeze, chest pain, sputum, fever, swelling, or skin rash.  Physical Exam  General - No distress ENT - No sinus tenderness, no oral exudate, no LAN, MP  3, scalloped tongue Cardiac - s1s2 regular, no murmur Chest - No wheeze/rales/dullness Back - No focal tenderness Abd - Soft, non-tender Ext - No edema Neuro - Normal strength Skin - No rashes Psych - normal mood, and behavior   Assessment/Plan  Obstructive sleep apnea. - will arrange for referral to Dr. Ron Parker to assess for oral appliance  Upper airway cough syndrome. - improved - monitor clinically  Sarcoidosis - she is on plaquenil per Dr. Trudie Reed with rheumatology   Patient Instructions  Will arrange for referral to Dr. Oneal Grout to assess for  oral appliance to treat obstructive sleep apnea  Follow up in 6 months    [name], MD [facility]/Sleep Pager:  [phone] 11/18/2067, 1:56 PM

## 2016-11-17 NOTE — Patient Instructions (Signed)
Will arrange for referral to Dr. Mark Katz to assess for oral appliance to treat obstructive sleep apnea  Follow up in 6 months 

## 2016-11-18 LAB — CUP PACEART REMOTE DEVICE CHECK
Battery Remaining Percentage: 95 %
Brady Statistic RV Percent Paced: 99 %
Date Time Interrogation Session: 20180213090547
Implantable Lead Location: 753859
Implantable Lead Location: 753860
Implantable Pulse Generator Implant Date: 20150330
Lead Channel Impedance Value: 350 Ohm
Lead Channel Impedance Value: 460 Ohm
Lead Channel Pacing Threshold Amplitude: 1.25 V
Lead Channel Pacing Threshold Pulse Width: 0.5 ms
Lead Channel Sensing Intrinsic Amplitude: 5 mV
Lead Channel Setting Pacing Amplitude: 2 V
Lead Channel Setting Pacing Pulse Width: 0.5 ms
MDC IDC LEAD IMPLANT DT: 19990414
MDC IDC LEAD IMPLANT DT: 19990414
MDC IDC SET LEADCHNL RA PACING AMPLITUDE: 2 V
MDC IDC SET LEADCHNL RV SENSING SENSITIVITY: 8 mV
MDC IDC STAT BRADY RA PERCENT PACED: 89 %
Pulse Gen Model: 3242
Pulse Gen Serial Number: 7610861

## 2016-12-02 ENCOUNTER — Other Ambulatory Visit: Payer: Self-pay | Admitting: Internal Medicine

## 2017-02-07 ENCOUNTER — Other Ambulatory Visit: Payer: Self-pay | Admitting: Internal Medicine

## 2017-02-18 ENCOUNTER — Encounter: Payer: Self-pay | Admitting: Internal Medicine

## 2017-02-18 ENCOUNTER — Ambulatory Visit (INDEPENDENT_AMBULATORY_CARE_PROVIDER_SITE_OTHER): Payer: Medicare Other | Admitting: Internal Medicine

## 2017-02-18 VITALS — BP 120/68 | HR 63 | Ht 63.0 in | Wt 195.0 lb

## 2017-02-18 DIAGNOSIS — I442 Atrioventricular block, complete: Secondary | ICD-10-CM | POA: Diagnosis not present

## 2017-02-18 DIAGNOSIS — I480 Paroxysmal atrial fibrillation: Secondary | ICD-10-CM | POA: Diagnosis not present

## 2017-02-18 DIAGNOSIS — I428 Other cardiomyopathies: Secondary | ICD-10-CM | POA: Diagnosis not present

## 2017-02-18 DIAGNOSIS — I5022 Chronic systolic (congestive) heart failure: Secondary | ICD-10-CM | POA: Diagnosis not present

## 2017-02-18 DIAGNOSIS — Z950 Presence of cardiac pacemaker: Secondary | ICD-10-CM

## 2017-02-18 DIAGNOSIS — I495 Sick sinus syndrome: Secondary | ICD-10-CM

## 2017-02-18 LAB — CUP PACEART INCLINIC DEVICE CHECK
Battery Voltage: 2.99 V
Brady Statistic RA Percent Paced: 89 %
Date Time Interrogation Session: 20180516100300
Implantable Lead Implant Date: 19990414
Implantable Lead Implant Date: 19990414
Implantable Lead Location: 753860
Implantable Pulse Generator Implant Date: 20150330
Lead Channel Impedance Value: 325 Ohm
Lead Channel Impedance Value: 462.5 Ohm
Lead Channel Pacing Threshold Amplitude: 0.5 V
Lead Channel Pacing Threshold Amplitude: 1 V
Lead Channel Pacing Threshold Pulse Width: 0.4 ms
Lead Channel Pacing Threshold Pulse Width: 0.4 ms
Lead Channel Pacing Threshold Pulse Width: 0.5 ms
Lead Channel Pacing Threshold Pulse Width: 0.5 ms
Lead Channel Setting Pacing Amplitude: 2 V
Lead Channel Setting Pacing Pulse Width: 0.5 ms
Lead Channel Setting Sensing Sensitivity: 8 mV
MDC IDC LEAD LOCATION: 753859
MDC IDC MSMT LEADCHNL RA PACING THRESHOLD AMPLITUDE: 0.5 V
MDC IDC MSMT LEADCHNL RV PACING THRESHOLD AMPLITUDE: 1 V
MDC IDC SET LEADCHNL RV PACING AMPLITUDE: 2.375
MDC IDC STAT BRADY RV PERCENT PACED: 99.99 %
Pulse Gen Model: 3242
Pulse Gen Serial Number: 7610861

## 2017-02-18 LAB — TSH: TSH: 2.54 u[IU]/mL (ref 0.450–4.500)

## 2017-02-18 LAB — CBC
Hematocrit: 33.1 % — ABNORMAL LOW (ref 34.0–46.6)
Hemoglobin: 10.8 g/dL — ABNORMAL LOW (ref 11.1–15.9)
MCH: 30 pg (ref 26.6–33.0)
MCHC: 32.6 g/dL (ref 31.5–35.7)
MCV: 92 fL (ref 79–97)
Platelets: 198 10*3/uL (ref 150–379)
RBC: 3.6 x10E6/uL — ABNORMAL LOW (ref 3.77–5.28)
RDW: 13.7 % (ref 12.3–15.4)
WBC: 3.6 10*3/uL (ref 3.4–10.8)

## 2017-02-18 LAB — MAGNESIUM: MAGNESIUM: 2.3 mg/dL (ref 1.6–2.3)

## 2017-02-18 MED ORDER — FUROSEMIDE 20 MG PO TABS: ORAL_TABLET | ORAL | 3 refills | Status: DC

## 2017-02-18 NOTE — Addendum Note (Signed)
Addended by: Drue Novel I on: 02/18/2017 10:13 AM   Modules accepted: Orders

## 2017-02-18 NOTE — Patient Instructions (Addendum)
Medication Instructions:  INCREASE lasix to 40 mg daily for 3 days   Labwork: LABS TODAY: CBC, Magnesium, TSH  Testing/Procedures: Your physician has requested that you have an echocardiogram. Echocardiography is a painless test that uses sound waves to create images of your heart. It provides your doctor with information about the size and shape of your heart and how well your heart's chambers and valves are working. This procedure takes approximately one hour. There are no restrictions for this procedure.   Follow-Up: Remote monitoring is used to monitor your Pacemaker from home. This monitoring reduces the number of office visits required to check your device to one time per year. It allows [place] to keep an eye on the functioning of your device to ensure it is working properly. You are scheduled for a device check from home on 05/20/17. You may send your transmission at any time that day. If you have a wireless device, the transmission will be sent automatically. After your physician reviews your transmission, you will receive a postcard with your next transmission date.    Your physician wants you to follow-up in: 1 year with Dr. [name]. You will receive a reminder letter in the mail two months in advance. If you don't receive a letter, please call our office to schedule the follow-up appointment.   Any Other Special Instructions Will Be Listed Below (If Applicable).  Echocardiogram An echocardiogram, or echocardiography, uses sound waves (ultrasound) to produce an image of your heart. The echocardiogram is simple, painless, obtained within a short period of time, and offers valuable information to your health care provider. The images from an echocardiogram can provide information such as:  Evidence of coronary artery disease (CAD).  Heart size.  Heart muscle function.  Heart valve function.  Aneurysm detection.  Evidence of a past heart attack.  Fluid buildup around the  heart.  Heart muscle thickening.  Assess heart valve function. Tell a health care provider about:  Any allergies you have.  All medicines you are taking, including vitamins, herbs, eye drops, creams, and over-the-counter medicines.  Any problems you or family members have had with anesthetic medicines.  Any blood disorders you have.  Any surgeries you have had.  Any medical conditions you have.  Whether you are pregnant or may be pregnant. What happens before the procedure? No special preparation is needed. Eat and drink normally. What happens during the procedure?  In order to produce an image of your heart, gel will be applied to your chest and a wand-like tool (transducer) will be moved over your chest. The gel will help transmit the sound waves from the transducer. The sound waves will harmlessly bounce off your heart to allow the heart images to be captured in real-time motion. These images will then be recorded.  You may need an IV to receive a medicine that improves the quality of the pictures. What happens after the procedure? You may return to your normal schedule including diet, activities, and medicines, unless your health care provider tells you otherwise. This information is not intended to replace advice given to you by your health care provider. Make sure you discuss any questions you have with your health care provider. Document Released: 09/19/2000 Document Revised: 05/10/2016 Document Reviewed: 05/30/2013 Elsevier Interactive Patient Education  2017 [employer].    If you need a refill on your cardiac medications before your next appointment, please call your pharmacy.

## 2017-02-18 NOTE — Progress Notes (Signed)
Patient Care Team: Carol Ada, MD as PCP - General (Family Medicine)   HPI  [name] is a 71 y.o. 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    She is having increasing problems with shortness of breath. She has nocturnal dyspnea and orthopnea. She's had dyspnea on exertion relieved by rest. It is unassociated with chest discomfort;  She's had abdominal bloating   She is also having icreasing fatigue. In this regard is no were reviewed she had a negative sleep study  2016  She also reports having had bleeding from her bowels associated with clots. Some body told her it might have been kidney stones.  She has no prior history of GI bleeding. Does not have a history of kidney stones.    DATE TEST    4/13  Echo   EF 35-40 %   3/15 Echo   EF 40-45 %         She was 100% ventricularly paced with a QRS duration of 198 msec     Date Cr K Mg CBC  6/15  2.3 4.5     6/17  1.82* 5.2     4/18  1.91 5.1            Above labs obtained and personally reviewed       Past Medical History:  Diagnosis Date  . Atrial tachycardia ([place])   . Chronic systolic heart failure ([place])   . CKD (chronic kidney disease)   . Complete heart block (HCC)    s/p STJ dual chamber pacemaker  . Diverticulosis   . Glaucoma   . HLD (hyperlipidemia)   . HTN (hypertension)   . Iritis   . NICM (nonischemic cardiomyopathy) ([place])    echo 1/11: EF 35-40%, mod LVH, Grade 1 diast dysfxn, mild BAE;    cath 2/06: luminal irregs;  Myoview 10/2:  no scar or ischemia, EF 37%   . OSA (obstructive sleep apnea) 08/21/2016  . Other primary cardiomyopathies   . Sarcoidosis     Past Surgical History:  Procedure Laterality Date  . ABDOMINAL HYSTERECTOMY    . AV NODE ABLATION    . PACEMAKER INSERTION  1999; 12/26/13   STJ dual chamber pacemaker implanted 1999; gen change 12/2013 by Dr [name] - STJ   . PERMANENT PACEMAKER  GENERATOR CHANGE N/A 01/02/2014   Procedure: PERMANENT PACEMAKER GENERATOR CHANGE;  Surgeon: Deboraha Sprang, MD;  Location: [facility] CATH LAB;  Service: Cardiovascular;  Laterality: N/A;  . TUBAL LIGATION    . US ECHOCARDIOGRAPHY     EF was 40-45% with left atrial enlargement. test was done for possible TIA. She had mild LVH 1.3 cm     Current Outpatient Prescriptions  Medication Sig Dispense Refill  . albuterol (PROAIR HFA) 108 (90 BASE) MCG/ACT inhaler Inhale 2 puffs into the lungs every 6 (six) hours as needed for wheezing. 1 Inhaler 3  . bimatoprost (LUMIGAN) 0.01 % SOLN Place 1 drop into the right eye at bedtime.     . carvedilol (COREG) 25 MG tablet TAKE 1 TABLET BY MOUTH TWICE DAILY WITH A MEAL 180 tablet 0  . clindamycin (CLEOCIN) 150 MG capsule Take 150 mg by mouth. Prior to dental procedures    . clopidogrel (PLAVIX) 75 MG tablet Take 75 mg by mouth once.     . cycloSPORINE (RESTASIS) 0.05 % ophthalmic emulsion Place  2 drops into both eyes 2 (two) times daily.    Marland Kitchen ELIQUIS 5 MG TABS tablet TAKE 1 TABLET BY MOUTH TWICE DAILY 60 tablet 5  . ezetimibe (ZETIA) 10 MG tablet Take 10 mg by mouth daily.    . famotidine (PEPCID) 20 MG tablet TAKE 1 TABLET BY MOUTH TWICE DAILY 60 tablet 6  . furosemide (LASIX) 20 MG tablet TAKE 1 TABLET FOR PAIN EVERY DAY AS NEEDED IF WEIGHT INCREASED BY 2 POUNDS OR MORE, INCREASED SHORTNESS OF BREATH 90 tablet 3  . hydroxychloroquine (PLAQUENIL) 200 MG tablet Take 200 mg by mouth 2 (two) times daily.     Marland Kitchen loteprednol (LOTEMAX) 0.5 % ophthalmic suspension Place 1 drop into both eyes 4 (four) times daily.     . methylcellulose (ARTIFICIAL TEARS) 1 % ophthalmic solution Place 1 drop into both eyes daily as needed. For dry eyes    . Multiple Vitamins-Minerals (HAIR/SKIN/NAILS PO) Take 2 tablets by mouth daily.    . Multiple Vitamins-Minerals (PRESERVISION/LUTEIN) CAPS Take 1 capsule by mouth daily.    . nitroGLYCERIN (NITROSTAT) 0.4 MG SL tablet Place 0.4 mg under the  tongue every 5 (five) minutes as needed for chest pain. For chest pain     No current facility-administered medications for this visit.     Allergies  Allergen Reactions  . Codeine Itching  . Morphine And Related     Throat closing  . Penicillins Itching    Review of Systems negative except from HPI and PMH  Physical Exam BP 120/68   Pulse 63   Ht 5\' 3"  (1.6 m)   Wt 195 lb (88.5 kg)   SpO2 97%   BMI 34.54 kg/m  Well developed and nourished in no acute distress HENT normal Neck supple with JVP-8 Clear Device pocket well healed; without hematoma or erythema.  There is no tethering  Regular rate and rhythm, no murmurs or gallops Abd-soft with active BS No Clubbing cyanosis edema Skin-warm and dry A & Oriented  Grossly normal sensory and motor function   ECG demonstrates AV  pacing at 60 Intervals 16/23/52  Assessment and  Plan  Nonischemic cardiomyopathy   Sarcoid heart disease   Hypertension  Complete heart block   Renal Insufficiency  Grade 4   Sinus node dysfunction  Congestive Heart failure acute chronic 3  GI bleeding    Pacemaker  St Jude CRT The patient's device was interrogated and the information was fully reviewed.  The device was reprogrammed to  activate rate response   The patient is having worsening congestive heart failure in the context of long-term ventricular pacing and a previously known mildly impaired left ventricle. 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 We will undertake an echocardiogram.  She also has significant renal insufficiency-grade 4 which may limit our ability to useguidelines directed therapy; we will start today increasing her furosemide from 20--40 for 3 days  Once we have her echo back we can discuss with Dr. Powellmedication strategies. I suspect she will end up on BiDil and that undertaking CRT upgrade will be the lowest renal risk procedure to improve functional status  I have spoken with Dr. Tamala Julian  regarding her GI bleeding she will be seen there this afternoon area  Blood pressure is well-controlled   More than 50% of 45 min was spent in counseling related to the above

## 2017-02-19 ENCOUNTER — Other Ambulatory Visit: Payer: Self-pay

## 2017-02-19 ENCOUNTER — Other Ambulatory Visit: Payer: Self-pay | Admitting: Internal Medicine

## 2017-02-19 MED ORDER — FUROSEMIDE 20 MG PO TABS: ORAL_TABLET | ORAL | 3 refills | Status: DC

## 2017-03-04 ENCOUNTER — Other Ambulatory Visit: Payer: Self-pay

## 2017-03-04 ENCOUNTER — Ambulatory Visit (HOSPITAL_COMMUNITY): Payer: Medicare Other | Attending: Cardiovascular Disease

## 2017-03-04 DIAGNOSIS — G4733 Obstructive sleep apnea (adult) (pediatric): Secondary | ICD-10-CM | POA: Insufficient documentation

## 2017-03-04 DIAGNOSIS — I5022 Chronic systolic (congestive) heart failure: Secondary | ICD-10-CM | POA: Diagnosis present

## 2017-03-04 DIAGNOSIS — D869 Sarcoidosis, unspecified: Secondary | ICD-10-CM | POA: Insufficient documentation

## 2017-03-04 DIAGNOSIS — I429 Cardiomyopathy, unspecified: Secondary | ICD-10-CM | POA: Diagnosis not present

## 2017-03-04 DIAGNOSIS — I110 Hypertensive heart disease with heart failure: Secondary | ICD-10-CM | POA: Diagnosis not present

## 2017-03-04 DIAGNOSIS — I071 Rheumatic tricuspid insufficiency: Secondary | ICD-10-CM | POA: Insufficient documentation

## 2017-03-04 DIAGNOSIS — I371 Nonrheumatic pulmonary valve insufficiency: Secondary | ICD-10-CM | POA: Diagnosis not present

## 2017-03-04 DIAGNOSIS — E785 Hyperlipidemia, unspecified: Secondary | ICD-10-CM | POA: Insufficient documentation

## 2017-03-04 DIAGNOSIS — I471 Supraventricular tachycardia: Secondary | ICD-10-CM | POA: Diagnosis not present

## 2017-03-11 ENCOUNTER — Telehealth: Payer: Self-pay | Admitting: Internal Medicine

## 2017-03-11 NOTE — Telephone Encounter (Signed)
New message   Request for surgical clearance:  1. What type of surgery is being performed? colonoscopy  2. When is this surgery scheduled? June 18th  3. Are there any medications that need to be held prior to surgery and how long? plavix held 7 days prior  4. Name of physician performing surgery? Dr. Teena Irani  5. What is your office phone and fax number? [phone] fax [phone] [facility] physicians  They faxed over surgery clearance weeks ago and really need someone to call them back/approve this surgery clearance as she needs to hold Plavix 7 days prior.

## 2017-03-13 ENCOUNTER — Telehealth: Payer: Self-pay | Admitting: Internal Medicine

## 2017-03-13 ENCOUNTER — Ambulatory Visit (INDEPENDENT_AMBULATORY_CARE_PROVIDER_SITE_OTHER): Payer: Medicare Other | Admitting: Internal Medicine

## 2017-03-13 VITALS — BP 124/82 | HR 62 | Ht 63.0 in | Wt 199.4 lb

## 2017-03-13 DIAGNOSIS — I428 Other cardiomyopathies: Secondary | ICD-10-CM | POA: Diagnosis not present

## 2017-03-13 DIAGNOSIS — I5022 Chronic systolic (congestive) heart failure: Secondary | ICD-10-CM

## 2017-03-13 DIAGNOSIS — Z950 Presence of cardiac pacemaker: Secondary | ICD-10-CM

## 2017-03-13 DIAGNOSIS — Z01812 Encounter for preprocedural laboratory examination: Secondary | ICD-10-CM

## 2017-03-13 DIAGNOSIS — I480 Paroxysmal atrial fibrillation: Secondary | ICD-10-CM

## 2017-03-13 DIAGNOSIS — I442 Atrioventricular block, complete: Secondary | ICD-10-CM

## 2017-03-13 NOTE — Progress Notes (Signed)
Patient Care Team: Carol Ada, MD as PCP - General (Family Medicine)   HPI  [name] is a 71 y.o. 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  She also has a history of atrial arrhythmias for which he takes apixoban,  SCAF was noted 5/17. Episode duration was 12 hours or so.  Plavix was initiated According to the patient  following a TIA by primary care physician in.  She continues to struggle with shortness of breath and fatigue. She's had no peripheral edema but does have abdominal bloating. She's had no chest pain.  She has had a TIA     DATE TEST    4/13 Echo   EF 35-40 %   3/15 Echo   EF 40-45 %   5/18 Echo EF 25-30%    She was 100% ventricularly paced with a QRS duration of 198 msec     Date Cr K Mg Hgb  6/15  2.3 4.5     6/17  1.82* 5.2     4/18  1.91 5.1  10.8          Above labs obtained and personally reviewed       Past Medical History:  Diagnosis Date  . Atrial tachycardia ([place])   . Chronic systolic heart failure ([place])   . CKD (chronic kidney disease)   . Complete heart block (HCC)    s/p STJ dual chamber pacemaker  . Diverticulosis   . Glaucoma   . HLD (hyperlipidemia)   . HTN (hypertension)   . Iritis   . NICM (nonischemic cardiomyopathy) ([place])    echo 1/11: EF 35-40%, mod LVH, Grade 1 diast dysfxn, mild BAE;    cath 2/06: luminal irregs;  Myoview 10/2:  no scar or ischemia, EF 37%   . OSA (obstructive sleep apnea) 08/21/2016  . Other primary cardiomyopathies   . Sarcoidosis     Past Surgical History:  Procedure Laterality Date  . ABDOMINAL HYSTERECTOMY    . AV NODE ABLATION    . PACEMAKER INSERTION  1999; 12/26/13   STJ dual chamber pacemaker implanted 1999; gen change 12/2013 by Dr [name] - STJ   . PERMANENT PACEMAKER GENERATOR CHANGE N/A 01/02/2014   Procedure: PERMANENT PACEMAKER GENERATOR CHANGE;  Surgeon: Deboraha Sprang, MD;  Location: [facility]  CATH LAB;  Service: Cardiovascular;  Laterality: N/A;  . TUBAL LIGATION    . US ECHOCARDIOGRAPHY     EF was 40-45% with left atrial enlargement. test was done for possible TIA. She had mild LVH 1.3 cm     Current Outpatient Prescriptions  Medication Sig Dispense Refill  . albuterol (PROAIR HFA) 108 (90 BASE) MCG/ACT inhaler Inhale 2 puffs into the lungs every 6 (six) hours as needed for wheezing. 1 Inhaler 3  . bimatoprost (LUMIGAN) 0.01 % SOLN Place 1 drop into the right eye at bedtime.     . carvedilol (COREG) 25 MG tablet TAKE 1 TABLET BY MOUTH TWICE DAILY WITH A MEAL 180 tablet 0  . clindamycin (CLEOCIN) 150 MG capsule Take 150 mg by mouth. Prior to dental procedures    . cycloSPORINE (RESTASIS) 0.05 % ophthalmic emulsion Place 2 drops into both eyes 2 (two) times daily.    Marland Kitchen ELIQUIS 5 MG TABS tablet TAKE 1 TABLET BY MOUTH TWICE DAILY 60 tablet 5  . ezetimibe (ZETIA) 10 MG tablet Take 10 mg by mouth  daily.    . famotidine (PEPCID) 20 MG tablet TAKE 1 TABLET BY MOUTH TWICE DAILY 60 tablet 6  . furosemide (LASIX) 20 MG tablet TAKE 1 TABLET EVERY DAY FOR SWELLING AND SHORTNESS OF BREATH 90 tablet 3  . hydroxychloroquine (PLAQUENIL) 200 MG tablet Take 200 mg by mouth 2 (two) times daily.     Marland Kitchen loteprednol (LOTEMAX) 0.5 % ophthalmic suspension Place 1 drop into both eyes 4 (four) times daily.     . Multiple Vitamins-Minerals (PRESERVISION/LUTEIN) CAPS Take 1 capsule by mouth daily.    . nitroGLYCERIN (NITROSTAT) 0.4 MG SL tablet Place 0.4 mg under the tongue every 5 (five) minutes as needed for chest pain. For chest pain    . clopidogrel (PLAVIX) 75 MG tablet Take 75 mg by mouth once.      No current facility-administered medications for this visit.     Allergies  Allergen Reactions  . Codeine Itching  . Morphine And Related     Throat closing  . Penicillins Itching    Review of Systems negative except from HPI and PMH  Physical Exam BP 124/82   Pulse 62   Ht 5\' 3"  (1.6 m)   Wt  199 lb 6.4 oz (90.4 kg)   SpO2 98%   BMI 35.32 kg/m  Well developed and nourished in no acute distress HENT normal Neck supple with JVP-8 Clear Device pocket well healed; without hematoma or erythema.  There is no tethering  Regular rate and rhythm, no murmurs or gallops Abd-soft with active BS No Clubbing cyanosis edema Skin-warm and dry A & Oriented  Grossly normal sensory and motor function   ECG demonstrates AV  pacing at 60 Intervals 16/23/52  Assessment and  Plan  Nonischemic cardiomyopathy   Sarcoid heart disease   Atrial fibrillation //SCAF   Hypertension  Complete heart block   Renal Insufficiency  Grade 4   Sinus node dysfunction  Congestive Heart failure acute chronic 3  GI bleeding    Pacemaker  St Jude    The patient has progressive left ventricular dysfunction the setting of chronic RV apical pacing. We have discussed the role of CRT upgrade in the hopes of reversing pacemaker-induced cardiomyopathy. We have reviewed the benefits and risks of generator replacement.  These include but are not limited to lead fracture and infection.  The patient understands, agrees and is willing to proceed.   She is also aware of the attendant  risks of vascular access and lead placement.  Her heart failure needs ongoing gentle diuresis   Hopefully CRt will allow improved cardiac performance and diuresis without putting kidneys at risk  BP is well controlled  The other issue is anticoagulation. With her prior history of a TIA I think her duration (12-24 hr) of Subclinical Atrial fibrillation  is sufficient to justify anticoagulation. I am not sure however  that she needs additional antiplatelet therapy. Will stop plavix   We'll continue carvedilol and add low-dose BiDil

## 2017-03-13 NOTE — Patient Instructions (Addendum)
Medication Instructions: Your physician recommends that you continue on your current medications as directed. Please refer to the Current Medication list given to you today.   Labwork: Your physician recommends that you return for lab work on Monday 04/13/17 for your pre-procedural labs: CBC, BMET, PT/INR   Procedures/Testing: Your physician has recommended that you have your pacemaker upgraded.  Wire (leads) are attached to the pacemaker that goes into the chambers of you heart. This is done in the hospital and usually requires and overnight stay. Please see the instruction sheet given to you today for more information.   Follow-Up: Your physician recommends that you schedule a follow-up appointment in: 10 - 14 days from 04/20/17 in the [facility] for your wound check.  Your physician recommends that you schedule a follow-up appointment in 3 Months after 04/20/17 with Dr. [name].    Any Additional Special Instructions Will Be Listed Below (If Applicable).     If you need a refill on your cardiac medications before your next appointment, please call your pharmacy.  \

## 2017-03-13 NOTE — Telephone Encounter (Signed)
Form faxed back to [facility] stating Dr. Olin Pia recommendations to hold off on colonoscopy of right now unless emergent at the patient needs to have a device upgrade.   Faxed to [phone]- confirmation received.

## 2017-03-13 NOTE — Telephone Encounter (Signed)
Will fax response to number provided.

## 2017-03-13 NOTE — Telephone Encounter (Signed)
New message     Pt wants to know if she should resume the Plavix until next month when she has her surgery

## 2017-03-13 NOTE — Telephone Encounter (Signed)
Reviewed plavix with Dr. [name]- per MD- since the patient is on eliquis, he does not feel she needs to be on plavix. I have called and spoken with the patient to notify her of this- she is aware to discontinue plavix and stay on eliquis. She voices understanding.

## 2017-03-13 NOTE — Telephone Encounter (Signed)
seh is to undergo procedure upgrade,  Unless colonoscopy is EMERGENT it should be deferred

## 2017-04-13 ENCOUNTER — Other Ambulatory Visit: Payer: Medicare Other

## 2017-04-13 DIAGNOSIS — I480 Paroxysmal atrial fibrillation: Secondary | ICD-10-CM

## 2017-04-13 DIAGNOSIS — Z950 Presence of cardiac pacemaker: Secondary | ICD-10-CM

## 2017-04-13 DIAGNOSIS — Z01812 Encounter for preprocedural laboratory examination: Secondary | ICD-10-CM

## 2017-04-13 LAB — BASIC METABOLIC PANEL
BUN / CREAT RATIO: 11 — AB (ref 12–28)
BUN: 23 mg/dL (ref 8–27)
CO2: 24 mmol/L (ref 20–29)
CREATININE: 2.17 mg/dL — AB (ref 0.57–1.00)
Calcium: 9.9 mg/dL (ref 8.7–10.3)
Chloride: 104 mmol/L (ref 96–106)
GFR calc Af Amer: 26 mL/min/{1.73_m2} — ABNORMAL LOW (ref >59)
GFR calc non Af Amer: 23 mL/min/{1.73_m2} — ABNORMAL LOW (ref >59)
GLUCOSE: 85 mg/dL (ref 65–99)
POTASSIUM: 4.8 mmol/L (ref 3.5–5.2)
SODIUM: 143 mmol/L (ref 134–144)

## 2017-04-13 LAB — CBC WITH DIFFERENTIAL/PLATELET
BASOS: 0 %
Basophils Absolute: 0 10*3/uL (ref 0.0–0.2)
EOS (ABSOLUTE): 0.2 10*3/uL (ref 0.0–0.4)
EOS: 5 %
HEMATOCRIT: 31.9 % — AB (ref 34.0–46.6)
Hemoglobin: 10.2 g/dL — ABNORMAL LOW (ref 11.1–15.9)
IMMATURE GRANULOCYTES: 0 %
Immature Grans (Abs): 0 10*3/uL (ref 0.0–0.1)
LYMPHS ABS: 0.7 10*3/uL (ref 0.7–3.1)
Lymphs: 20 %
MCH: 28.7 pg (ref 26.6–33.0)
MCHC: 32 g/dL (ref 31.5–35.7)
MCV: 90 fL (ref 79–97)
MONOS ABS: 0.4 10*3/uL (ref 0.1–0.9)
Monocytes: 11 %
NEUTROS PCT: 64 %
Neutrophils Absolute: 2.2 10*3/uL (ref 1.4–7.0)
PLATELETS: 225 10*3/uL (ref 150–379)
RBC: 3.55 x10E6/uL — AB (ref 3.77–5.28)
RDW: 13.4 % (ref 12.3–15.4)
WBC: 3.4 10*3/uL (ref 3.4–10.8)

## 2017-04-13 LAB — PROTIME-INR
INR: 1.1 (ref 0.8–1.2)
Prothrombin Time: 11.2 s (ref 9.1–12.0)

## 2017-04-20 ENCOUNTER — Encounter (HOSPITAL_COMMUNITY): Payer: Self-pay | Admitting: General Practice

## 2017-04-20 ENCOUNTER — Encounter (HOSPITAL_COMMUNITY)
Admission: RE | Disposition: A | Discharge status: Home / Self Care | Payer: Self-pay | Source: Ambulatory Visit | Attending: Internal Medicine

## 2017-04-20 ENCOUNTER — Ambulatory Visit (HOSPITAL_COMMUNITY)
Admission: RE | Admit: 2017-04-20 | Discharge: 2017-04-21 | Disposition: A | Discharge status: Home / Self Care | Payer: Medicare Other | Source: Ambulatory Visit | Attending: Internal Medicine | Admitting: Internal Medicine

## 2017-04-20 DIAGNOSIS — Z87891 Personal history of nicotine dependence: Secondary | ICD-10-CM | POA: Insufficient documentation

## 2017-04-20 DIAGNOSIS — N184 Chronic kidney disease, stage 4 (severe): Secondary | ICD-10-CM | POA: Diagnosis not present

## 2017-04-20 DIAGNOSIS — I509 Heart failure, unspecified: Secondary | ICD-10-CM

## 2017-04-20 DIAGNOSIS — E785 Hyperlipidemia, unspecified: Secondary | ICD-10-CM | POA: Diagnosis not present

## 2017-04-20 DIAGNOSIS — Z7901 Long term (current) use of anticoagulants: Secondary | ICD-10-CM | POA: Insufficient documentation

## 2017-04-20 DIAGNOSIS — Z8249 Family history of ischemic heart disease and other diseases of the circulatory system: Secondary | ICD-10-CM | POA: Insufficient documentation

## 2017-04-20 DIAGNOSIS — I5022 Chronic systolic (congestive) heart failure: Secondary | ICD-10-CM | POA: Diagnosis not present

## 2017-04-20 DIAGNOSIS — I130 Hypertensive heart and chronic kidney disease with heart failure and stage 1 through stage 4 chronic kidney disease, or unspecified chronic kidney disease: Secondary | ICD-10-CM | POA: Insufficient documentation

## 2017-04-20 DIAGNOSIS — I480 Paroxysmal atrial fibrillation: Secondary | ICD-10-CM | POA: Diagnosis not present

## 2017-04-20 DIAGNOSIS — Z880 Allergy status to penicillin: Secondary | ICD-10-CM | POA: Insufficient documentation

## 2017-04-20 DIAGNOSIS — I428 Other cardiomyopathies: Secondary | ICD-10-CM | POA: Insufficient documentation

## 2017-04-20 DIAGNOSIS — I442 Atrioventricular block, complete: Secondary | ICD-10-CM | POA: Diagnosis present

## 2017-04-20 DIAGNOSIS — Z885 Allergy status to narcotic agent status: Secondary | ICD-10-CM | POA: Insufficient documentation

## 2017-04-20 DIAGNOSIS — Z959 Presence of cardiac and vascular implant and graft, unspecified: Secondary | ICD-10-CM

## 2017-04-20 DIAGNOSIS — G4733 Obstructive sleep apnea (adult) (pediatric): Secondary | ICD-10-CM | POA: Insufficient documentation

## 2017-04-20 DIAGNOSIS — Z950 Presence of cardiac pacemaker: Secondary | ICD-10-CM | POA: Diagnosis present

## 2017-04-20 DIAGNOSIS — H409 Unspecified glaucoma: Secondary | ICD-10-CM | POA: Diagnosis not present

## 2017-04-20 DIAGNOSIS — D869 Sarcoidosis, unspecified: Secondary | ICD-10-CM | POA: Diagnosis present

## 2017-04-20 DIAGNOSIS — D8685 Sarcoid myocarditis: Secondary | ICD-10-CM | POA: Diagnosis not present

## 2017-04-20 HISTORY — DX: Respiratory tuberculosis unspecified: A15.9

## 2017-04-20 HISTORY — DX: Heart failure, unspecified: I50.9

## 2017-04-20 HISTORY — DX: Chronic kidney disease, stage 3 (moderate): N18.3

## 2017-04-20 HISTORY — DX: Cerebral infarction, unspecified: I63.9

## 2017-04-20 HISTORY — DX: Sarcoidosis of lung: D86.0

## 2017-04-20 HISTORY — DX: Unspecified osteoarthritis, unspecified site: M19.90

## 2017-04-20 HISTORY — PX: BIV UPGRADE: EP1202

## 2017-04-20 HISTORY — DX: Chronic kidney disease, stage 3 unspecified: N18.30

## 2017-04-20 LAB — SURGICAL PCR SCREEN
MRSA, PCR: NEGATIVE
Staphylococcus aureus: NEGATIVE

## 2017-04-20 SURGERY — BIV UPGRADE

## 2017-04-20 MED ORDER — CARVEDILOL 25 MG PO TABS
25.0000 mg | ORAL_TABLET | Freq: Two times a day (BID) | ORAL | Status: DC
  Administered 2017-04-20 – 2017-04-21 (×2): 25 mg via ORAL
  Filled 2017-04-20 (×2): qty 1
  Filled 2017-04-20 (×2): qty 2

## 2017-04-20 MED ORDER — EZETIMIBE 10 MG PO TABS
10.0000 mg | ORAL_TABLET | Freq: Every day | ORAL | Status: DC
  Administered 2017-04-20: 10 mg via ORAL
  Filled 2017-04-20: qty 1

## 2017-04-20 MED ORDER — LOTEPREDNOL ETABONATE 0.5 % OP SUSP
1.0000 [drp] | Freq: Four times a day (QID) | OPHTHALMIC | Status: DC
  Administered 2017-04-20 – 2017-04-21 (×4): 1 [drp] via OPHTHALMIC
  Filled 2017-04-20: qty 5

## 2017-04-20 MED ORDER — LATANOPROST 0.005 % OP SOLN
1.0000 [drp] | Freq: Every day | OPHTHALMIC | Status: DC
Start: 2017-04-20 — End: 2017-04-21
  Administered 2017-04-20: 21:00:00 1 [drp] via OPHTHALMIC
  Filled 2017-04-20: qty 2.5

## 2017-04-20 MED ORDER — SODIUM CHLORIDE 0.9 % IV SOLN
INTRAVENOUS | Status: DC
  Administered 2017-04-20: 08:00:00 via INTRAVENOUS

## 2017-04-20 MED ORDER — APIXABAN 5 MG PO TABS
5.0000 mg | ORAL_TABLET | Freq: Two times a day (BID) | ORAL | Status: DC
  Administered 2017-04-20 – 2017-04-21 (×2): 5 mg via ORAL
  Filled 2017-04-20 (×2): qty 1

## 2017-04-20 MED ORDER — VANCOMYCIN HCL IN DEXTROSE 1-5 GM/200ML-% IV SOLN
1000.0000 mg | INTRAVENOUS | Status: AC
  Administered 2017-04-20: 1000 mg via INTRAVENOUS
  Filled 2017-04-20: qty 200

## 2017-04-20 MED ORDER — ALBUTEROL SULFATE (2.5 MG/3ML) 0.083% IN NEBU: 2.5000 mg | INHALATION_SOLUTION | Freq: Four times a day (QID) | RESPIRATORY_TRACT | Status: DC | PRN

## 2017-04-20 MED ORDER — ACETAMINOPHEN 325 MG PO TABS
325.0000 mg | ORAL_TABLET | ORAL | Status: DC | PRN
  Administered 2017-04-20 – 2017-04-21 (×2): 650 mg via ORAL
  Filled 2017-04-20 (×2): qty 2

## 2017-04-20 MED ORDER — MUPIROCIN 2 % EX OINT
TOPICAL_OINTMENT | CUTANEOUS | Status: AC
  Administered 2017-04-20: 1 via TOPICAL
  Filled 2017-04-20: qty 22

## 2017-04-20 MED ORDER — IOPAMIDOL (ISOVUE-370) INJECTION 76%
INTRAVENOUS | Status: DC | PRN
  Administered 2017-04-20: 3 mL via INTRAVENOUS

## 2017-04-20 MED ORDER — VANCOMYCIN HCL IN DEXTROSE 1-5 GM/200ML-% IV SOLN
INTRAVENOUS | Status: AC
  Filled 2017-04-20: qty 200

## 2017-04-20 MED ORDER — FENTANYL CITRATE (PF) 100 MCG/2ML IJ SOLN
INTRAMUSCULAR | Status: DC | PRN
  Administered 2017-04-20 (×3): 25 ug via INTRAVENOUS

## 2017-04-20 MED ORDER — MUPIROCIN 2 % EX OINT
1.0000 "application " | TOPICAL_OINTMENT | Freq: Once | CUTANEOUS | Status: AC
  Administered 2017-04-20: 1 via TOPICAL
  Filled 2017-04-20: qty 22

## 2017-04-20 MED ORDER — LIDOCAINE HCL (PF) 1 % IJ SOLN
INTRAMUSCULAR | Status: AC
  Filled 2017-04-20: qty 60

## 2017-04-20 MED ORDER — ONDANSETRON HCL 4 MG/2ML IJ SOLN: 4.0000 mg | Freq: Four times a day (QID) | INTRAMUSCULAR | Status: DC | PRN

## 2017-04-20 MED ORDER — FENTANYL CITRATE (PF) 100 MCG/2ML IJ SOLN
INTRAMUSCULAR | Status: AC
  Filled 2017-04-20: qty 2

## 2017-04-20 MED ORDER — YOU HAVE A PACEMAKER BOOK
Freq: Once | Status: AC
  Administered 2017-04-20: 21:00:00
  Filled 2017-04-20: qty 1

## 2017-04-20 MED ORDER — HYDROXYCHLOROQUINE SULFATE 200 MG PO TABS
200.0000 mg | ORAL_TABLET | Freq: Two times a day (BID) | ORAL | Status: DC
  Administered 2017-04-20 – 2017-04-21 (×3): 200 mg via ORAL
  Filled 2017-04-20 (×3): qty 1

## 2017-04-20 MED ORDER — CLINDAMYCIN HCL 300 MG PO CAPS: 600.0000 mg | ORAL_CAPSULE | ORAL | Status: DC

## 2017-04-20 MED ORDER — CHLORHEXIDINE GLUCONATE 4 % EX LIQD
60.0000 mL | Freq: Once | CUTANEOUS | Status: DC
  Filled 2017-04-20: qty 60

## 2017-04-20 MED ORDER — CYCLOSPORINE 0.05 % OP EMUL
1.0000 [drp] | Freq: Two times a day (BID) | OPHTHALMIC | Status: DC
  Administered 2017-04-20 – 2017-04-21 (×2): 1 [drp] via OPHTHALMIC
  Filled 2017-04-20 (×3): qty 1

## 2017-04-20 MED ORDER — HYDRALAZINE HCL 20 MG/ML IJ SOLN
25.0000 mg | Freq: Once | INTRAMUSCULAR | Status: AC
  Administered 2017-04-20: 25 mg via INTRAVENOUS
  Filled 2017-04-20: qty 2

## 2017-04-20 MED ORDER — MIDAZOLAM HCL 5 MG/5ML IJ SOLN
INTRAMUSCULAR | Status: AC
  Filled 2017-04-20: qty 5

## 2017-04-20 MED ORDER — SODIUM CHLORIDE 0.9 % IV SOLN: INTRAVENOUS | Status: DC

## 2017-04-20 MED ORDER — MIDAZOLAM HCL 5 MG/5ML IJ SOLN
INTRAMUSCULAR | Status: DC | PRN
  Administered 2017-04-20: 2 mg via INTRAVENOUS
  Administered 2017-04-20: 1 mg via INTRAVENOUS
  Administered 2017-04-20: 2 mg via INTRAVENOUS

## 2017-04-20 MED ORDER — HEPARIN (PORCINE) IN NACL 2-0.9 UNIT/ML-% IJ SOLN
INTRAMUSCULAR | Status: DC | PRN
  Administered 2017-04-20: 500 mL

## 2017-04-20 MED ORDER — FUROSEMIDE 40 MG PO TABS
40.0000 mg | ORAL_TABLET | Freq: Once | ORAL | Status: DC
  Filled 2017-04-20: qty 1

## 2017-04-20 MED ORDER — APIXABAN 5 MG PO TABS: 5.0000 mg | ORAL_TABLET | Freq: Two times a day (BID) | ORAL | Status: DC

## 2017-04-20 MED ORDER — HEPARIN (PORCINE) IN NACL 2-0.9 UNIT/ML-% IJ SOLN
INTRAMUSCULAR | Status: AC
  Filled 2017-04-20: qty 500

## 2017-04-20 MED ORDER — SODIUM CHLORIDE 0.9 % IR SOLN
Status: AC
  Filled 2017-04-20: qty 2

## 2017-04-20 MED ORDER — VANCOMYCIN HCL IN DEXTROSE 1-5 GM/200ML-% IV SOLN
1000.0000 mg | Freq: Two times a day (BID) | INTRAVENOUS | Status: AC
  Administered 2017-04-20: 1000 mg via INTRAVENOUS
  Filled 2017-04-20: qty 200

## 2017-04-20 MED ORDER — HYDROXYCHLOROQUINE SULFATE 200 MG PO TABS
400.0000 mg | ORAL_TABLET | Freq: Every day | ORAL | Status: DC
  Filled 2017-04-20: qty 2

## 2017-04-20 MED ORDER — FAMOTIDINE 20 MG PO TABS
20.0000 mg | ORAL_TABLET | Freq: Two times a day (BID) | ORAL | Status: DC
  Administered 2017-04-20 – 2017-04-21 (×3): 20 mg via ORAL
  Filled 2017-04-20 (×3): qty 1

## 2017-04-20 MED ORDER — OCUVITE-LUTEIN PO CAPS
1.0000 | ORAL_CAPSULE | Freq: Two times a day (BID) | ORAL | Status: DC
  Administered 2017-04-20 – 2017-04-21 (×2): 1 via ORAL
  Filled 2017-04-20 (×3): qty 1

## 2017-04-20 MED ORDER — LIDOCAINE HCL (PF) 1 % IJ SOLN
INTRAMUSCULAR | Status: DC | PRN
  Administered 2017-04-20: 40 mL

## 2017-04-20 MED ORDER — SODIUM CHLORIDE 0.9 % IR SOLN
80.0000 mg | Status: AC
  Administered 2017-04-20: 80 mg
  Filled 2017-04-20: qty 2

## 2017-04-20 SURGICAL SUPPLY — 16 items
BALLN COR SINUS VENO 6F 80CM (BALLOONS) ×1
BALLN COR SINUS VENO 6FR 80 (BALLOONS) ×2
BALLOON COR SINUS VENO 6FR 80 (BALLOONS) IMPLANT
CABLE SURGICAL S-101-97-12 (CABLE) ×2 IMPLANT
CATH ATTAIN SELECT 6238TEL (CATHETERS) ×2 IMPLANT
CATH CPS DIRECT 135 DS2C020 (CATHETERS) ×2 IMPLANT
KIT ESSENTIALS PG (KITS) ×2 IMPLANT
KIT MICROINTRODUCER STIFF 5F (SHEATH) ×2 IMPLANT
LEAD QUARTET 1458Q-86CM (Lead) ×2 IMPLANT
PAD DEFIB LIFELINK (PAD) ×2 IMPLANT
SHEATH CLASSIC 9.5F (SHEATH) ×2 IMPLANT
SHIELD RADPAD SCOOP 12X17 (MISCELLANEOUS) ×2 IMPLANT
SLITTER UNIVERSAL DS2A003 (MISCELLANEOUS) ×2 IMPLANT
TRAY PACEMAKER INSERTION (PACKS) ×2 IMPLANT
WIRE ACUITY WHISPER EDS 4648 (WIRE) ×2 IMPLANT
WIRE HI TORQ VERSACORE-J 145CM (WIRE) ×2 IMPLANT

## 2017-04-20 NOTE — Discharge Instructions (Signed)
° ° °  Supplemental Discharge Instructions for  Pacemaker/Defibrillator Patients  Activity No heavy lifting or vigorous activity with your left/right arm for 6 to 8 weeks.  Do not raise your left/right arm above your head for one week.  Gradually raise your affected arm as drawn below.              04/24/17                   04/25/17                     04/26/17                  04/27/17 __  NO DRIVING for  1 week   ; you may begin driving on 8/82/80   .  WOUND CARE - Keep the wound area clean and dry.  Do not get this area we, no showers for 24 hours; you may shower on  04/21/17 evening  . - The tape/steri-strips on your wound will fall off; do not pull them off.  No bandage is needed on the site.  DO  NOT apply any creams, oils, or ointments to the wound area. - If you notice any drainage or discharge from the wound, any swelling or bruising at the site, or you develop a fever > 101? F after you are discharged home, call the office at once.  Special Instructions - You are still able to use cellular telephones; use the ear opposite the side where you have your pacemaker/defibrillator.  Avoid carrying your cellular phone near your device. - When traveling through airports, show security personnel your identification card to avoid being screened in the metal detectors.  Ask the security personnel to use the hand wand. - Avoid arc welding equipment, MRI testing (magnetic resonance imaging), TENS units (transcutaneous nerve stimulators).  Call the office for questions about other devices. - Avoid electrical appliances that are in poor condition or are not properly grounded. - Microwave ovens are safe to be near or to operate.  Additional information for defibrillator patients should your device go off: - If your device goes off ONCE and you feel fine afterward, notify the device clinic nurses. - If your device goes off ONCE and you do not feel well afterward, call 911. - If your device goes off  TWICE, call 911. - If your device goes off THREE times in one day, call 911.  DO NOT DRIVE YOURSELF OR A FAMILY MEMBER WITH A DEFIBRILLATOR TO THE HOSPITAL--CALL 911.

## 2017-04-20 NOTE — H&P (Signed)
Patient Care Team: Carol Ada, MD as PCP - General (Family Medicine)   HPI  [name] is a 71 y.o. female  Admitted for CRT upgrade 2/2 pacemaker assoc cardiomyopathy in the setting of sarcoid heart disease  She has symptoms of CHF w dyspnea on exertion and fatigue abd bloating but no edema   Generator replacement 2015 with CRT device because of concern about the development of cardiomyopathy  She also has a history of atrial arrhythmias for which he takes apixoban,  SCAF was noted 5/17. Episode duration was 12 hours or so.    DATE TEST    4/13 Echo   EF 35-40 %   3/15 Echo   EF 40-45 %   5/18 Echo EF 25-30%    She was 100% ventricularly paced with a QRS duration of 198 msec     Date Cr K Mg Hgb  6/15  2.3 4.5     6/17  1.82* 5.2     4/18  1.91 5.1  10.8             Records and Results Reviewed    Past Medical History:  Diagnosis Date  . Atrial tachycardia ([place])   . Chronic systolic heart failure ([place])   . CKD (chronic kidney disease)   . Complete heart block (HCC)    s/p STJ dual chamber pacemaker  . Diverticulosis   . Glaucoma   . HLD (hyperlipidemia)   . HTN (hypertension)   . Iritis   . NICM (nonischemic cardiomyopathy) ([place])    echo 1/11: EF 35-40%, mod LVH, Grade 1 diast dysfxn, mild BAE;    cath 2/06: luminal irregs;  Myoview 10/2:  no scar or ischemia, EF 37%   . OSA (obstructive sleep apnea) 08/21/2016  . Other primary cardiomyopathies   . Sarcoidosis     Past Surgical History:  Procedure Laterality Date  . ABDOMINAL HYSTERECTOMY    . AV NODE ABLATION    . PACEMAKER INSERTION  1999; 12/26/13   STJ dual chamber pacemaker implanted 1999; gen change 12/2013 by Dr [name] - STJ   . PERMANENT PACEMAKER GENERATOR CHANGE N/A 01/02/2014   Procedure: PERMANENT PACEMAKER GENERATOR CHANGE;  Surgeon: Deboraha Sprang, MD;  Location[facility] CATH LAB;  Service: Cardiovascular;  Laterality: N/A;  . TUBAL LIGATION    . US ECHOCARDIOGRAPHY       EF was 40-45% with left atrial enlargement. test was done for possible TIA. She had mild LVH 1.3 cm     Current Facility-Administered Medications  Medication Dose Route Frequency Provider Last Rate Last Dose  . 0.9 %  sodium chloride infusion   Intravenous Continuous Deboraha Sprang, MD      . 0.9 %  sodium chloride infusion   Intravenous Continuous Deboraha Sprang, MD      . chlorhexidine (HIBICLENS) 4 % liquid 4 application  60 mL Topical Once Deboraha Sprang, MD      . gentamicin (GARAMYCIN) 80 mg in sodium chloride irrigation 0.9 % 500 mL irrigation  80 mg Irrigation On Call Deboraha Sprang, MD      . mupirocin ointment (BACTROBAN) 2 % 1 application  1 application Topical Once Deboraha Sprang, MD      . vancomycin (VANCOCIN) IVPB 1000 mg/200 mL premix  1,000 mg Intravenous On Call Deboraha Sprang, MD        Allergies  Allergen Reactions  . Morphine And Related Anaphylaxis  Throat closing  . Codeine Itching  . Penicillins Itching    Has patient had a PCN reaction causing immediate rash, facial/tongue/throat swelling, SOB or lightheadedness with hypotension: No Has patient had a PCN reaction causing severe rash involving mucus membranes or skin necrosis: No Has patient had a PCN reaction that required hospitalization: No Has patient had a PCN reaction occurring within the last 10 years: No If all of the above answers are "NO", then may proceed with Cephalosporin use.       Social History  Substance Use Topics  . Smoking status: Former Smoker    Packs/day: 0.50    Years: 8.00    Types: Cigarettes    Quit date: 10/06/1988  . Smokeless tobacco: Never Used  . Alcohol use No     Family History  Problem Relation Age of Onset  . Heart disease Father   . Asthma Father   . Rheum arthritis Mother   . Diabetes Sister   . Colon cancer Brother   . Colon cancer Brother   . Coronary artery disease Sister   . Coronary artery disease Sister      No current facility-administered  medications on file prior to encounter.    Current Outpatient Prescriptions on File Prior to Encounter  Medication Sig Dispense Refill  . albuterol (PROAIR HFA) 108 (90 BASE) MCG/ACT inhaler Inhale 2 puffs into the lungs every 6 (six) hours as needed for wheezing. 1 Inhaler 3  . bimatoprost (LUMIGAN) 0.01 % SOLN Place 1 drop into the right eye at bedtime.     . carvedilol (COREG) 25 MG tablet TAKE 1 TABLET BY MOUTH TWICE DAILY WITH A MEAL 180 tablet 0  . clindamycin (CLEOCIN) 150 MG capsule Take 600 mg by mouth See admin instructions. Prior to dental procedures     . cycloSPORINE (RESTASIS) 0.05 % ophthalmic emulsion Place 1 drop into both eyes 2 (two) times daily.     Marland Kitchen ELIQUIS 5 MG TABS tablet TAKE 1 TABLET BY MOUTH TWICE DAILY 60 tablet 5  . ezetimibe (ZETIA) 10 MG tablet Take 10 mg by mouth daily at 2 PM.     . famotidine (PEPCID) 20 MG tablet TAKE 1 TABLET BY MOUTH TWICE DAILY (Patient taking differently: TAKE 1 TABLET BY MOUTH TWICE DAILY (in the morning & in the afternoon)) 60 tablet 6  . furosemide (LASIX) 20 MG tablet TAKE 1 TABLET EVERY DAY FOR SWELLING AND SHORTNESS OF BREATH (Patient taking differently: Take 20 mg by mouth daily as needed (for swelling/shortness of breath). ) 90 tablet 3  . hydroxychloroquine (PLAQUENIL) 200 MG tablet Take 400 mg by mouth daily.     Marland Kitchen loteprednol (LOTEMAX) 0.5 % ophthalmic suspension Place 1 drop into both eyes 4 (four) times daily.     . Multiple Vitamins-Minerals (PRESERVISION/LUTEIN) CAPS Take 1 capsule by mouth 2 (two) times daily. In the morning & afternoon    . nitroGLYCERIN (NITROSTAT) 0.4 MG SL tablet Place 0.4 mg under the tongue every 5 (five) minutes as needed for chest pain. For chest pain        Review of Systems negative except from HPI and PMH  Physical Exam BP 136/69   Pulse (!) 59   Temp 98.5 F (36.9 C) (Oral)   Resp 18   Ht 5\' 7"  (1.702 m)   Wt 185 lb (83.9 kg)   SpO2 97%   BMI 28.98 kg/m  Well developed and well  nourished in no acute distress HENT normal E scleral  and icterus clear Neck Supple JVP flat; carotids brisk and full Clear to ausculation Regular rate and rhythm, no murmurs gallops or rub Soft with active bowel sounds No clubbing cyanosis  Edema Alert and oriented, grossly normal motor and sensory function Skin Warm and Dry   labs reviewed  . Assessment and  Plan Sarcoid heart disease Cardiomyopathy--presumed pacing mediated  Complete heart block PM - medtronic Renal failure gd 4  For CRT upgrade today Will try and do with no or minimal contrast given renal dysfunction Reviewed with pt and hydrated

## 2017-04-20 NOTE — Discharge Summary (Signed)
ELECTROPHYSIOLOGY PROCEDURE DISCHARGE SUMMARY    Patient ID: [name],  MRN: [id number], DOB/AGE: 69-Dec-1947 69 y.o.  Admit date: 04/20/2017 Discharge date: 04/21/17  Primary Care Physician: Carol Ada, MD  Primary Cardiologist/Electrophysiologist: Dr. [name]  Primary Discharge Diagnosis:  1. NICM  Secondary Discharge Diagnosis:  1. CHB     Sarcoid 2. Paroxysmal AFib/SCAF     CHA2DS2Vasc is 6, on Eliquis 3. HTN   Allergies  Allergen Reactions  . Morphine And Related Anaphylaxis    Throat closing  . Codeine Itching  . Penicillins Itching    Has patient had a PCN reaction causing immediate rash, facial/tongue/throat swelling, SOB or lightheadedness with hypotension: No Has patient had a PCN reaction causing severe rash involving mucus membranes or skin necrosis: No Has patient had a PCN reaction that required hospitalization: No Has patient had a PCN reaction occurring within the last 10 years: No If all of the above answers are "NO", then may proceed with Cephalosporin use.      Procedures This Admission:  1.  Upgrade of PPM to a CRT-P on 04/20/17 by Dr [name].  The patient received a [employer] Jude [id number] lead serial number [id number] LV lead, the patient had already a CRT-P generator, [place] serial number [id number] There were no immediate post procedure complications. 2.  CXR on 04/21/17 demonstrated no pneumothorax status post device implantation.   Brief HPI: [name] is a 71 y.o. female was referred followed by EP in the outpatient setting with a PPM for CHB, LV function was observed to deteriorate felt likely 2/2 chronic RV pacing, and addition of LV lead was discussed.  Risks, benefits, and alternatives to the procedure were reviewed with the patient who wished to proceed.     Hospital Course:  The patient was admitted and underwent the addition of LV lead to upgrade dual chamber pacer to CRT-P with details as outlined above. She was  monitored on telemetry overnight which demonstrated AV pacing.  Left chest was without hematoma or ecchymosis.  The device was interrogated and found to be functioning normally.  CXR was obtained and demonstrated no pneumothorax status post device implantation.  Wound care, arm mobility, and restrictions were reviewed with the patient.  The patient was examined by Dr. [name] and considered stable for discharge to home.    Physical Exam: Vitals:   04/20/17 1600 04/20/17 2023 04/21/17 0558 04/21/17 0830  BP: (!) 127/46 (!) 107/41 132/61 (!) 116/37  Pulse:  60 60 65  Resp: (!) 23 (!) 25 (!) 21 (!) 24  Temp:  98.3 F (36.8 C) 97.9 F (36.6 C) (!) 97.5 F (36.4 C)  TempSrc:  Oral Oral Oral  SpO2:  98% 96% 96%  Weight:   180 lb 12.4 oz (82 kg)   Height:        GEN- The patient is well appearing, alert and oriented x 3 today.   HEENT: normocephalic, atraumatic; sclera clear, conjunctiva pink; hearing intact; oropharynx clear Lungs- slightly diminished at the bases, normal work of breathing.  No wheezes, rales, rhonchi Heart-  RRR, no murmurs, rubs or gallops, PMI not laterally displaced GI- soft, non-tender, non-distended Extremities- no clubbing, cyanosis, or edema MS- no significant deformity or atrophy Skin- warm and dry, no rash or lesion, left chest without hematoma/ecchymosis Psych- euthymic mood, full affect Neuro- no gross defecits  Labs:   Lab Results  Component Value Date   WBC 3.4 04/13/2017   HGB 10.2 (L) 04/13/2017  HCT 31.9 (L) 04/13/2017   MCV 90 04/13/2017   PLT 225 04/13/2017   No results for input(s): NA, K, CL, CO2, BUN, CREATININE, CALCIUM, PROT, BILITOT, ALKPHOS, ALT, AST, GLUCOSE in the last 168 hours.  Invalid input(s): LABALBU  Discharge Medications:  Allergies as of 04/21/2017      Reactions   Morphine And Related Anaphylaxis   Throat closing   Codeine Itching   Penicillins Itching   Has patient had a PCN reaction causing immediate rash,  facial/tongue/throat swelling, SOB or lightheadedness with hypotension: No Has patient had a PCN reaction causing severe rash involving mucus membranes or skin necrosis: No Has patient had a PCN reaction that required hospitalization: No Has patient had a PCN reaction occurring within the last 10 years: No If all of the above answers are "NO", then may proceed with Cephalosporin use.      Medication List    TAKE these medications   albuterol 108 (90 Base) MCG/ACT inhaler Commonly known as:  PROAIR HFA Inhale 2 puffs into the lungs every 6 (six) hours as needed for wheezing.   carvedilol 25 MG tablet Commonly known as:  COREG TAKE 1 TABLET BY MOUTH TWICE DAILY WITH A MEAL   clindamycin 150 MG capsule Commonly known as:  CLEOCIN Take 600 mg by mouth See admin instructions. Prior to dental procedures   cycloSPORINE 0.05 % ophthalmic emulsion Commonly known as:  RESTASIS Place 1 drop into both eyes 2 (two) times daily.   ELIQUIS 5 MG Tabs tablet Generic drug:  apixaban TAKE 1 TABLET BY MOUTH TWICE DAILY   famotidine 20 MG tablet Commonly known as:  PEPCID TAKE 1 TABLET BY MOUTH TWICE DAILY What changed:  See the new instructions.   furosemide 20 MG tablet Commonly known as:  LASIX TAKE 1 TABLET EVERY DAY FOR SWELLING AND SHORTNESS OF BREATH What changed:  how much to take  how to take this  when to take this  reasons to take this  additional instructions Notes to patient:  Take 2 tabs (40mg ) daily for 2 days, then resume your usual regime   hydroxychloroquine 200 MG tablet Commonly known as:  PLAQUENIL Take 400 mg by mouth daily.   LOTEMAX 0.5 % ophthalmic suspension Generic drug:  loteprednol Place 1 drop into both eyes 4 (four) times daily.   LUMIGAN 0.01 % Soln Generic drug:  bimatoprost Place 1 drop into the right eye at bedtime.   nitroGLYCERIN 0.4 MG SL tablet Commonly known as:  NITROSTAT Place 0.4 mg under the tongue every 5 (five) minutes as  needed for chest pain. For chest pain   PRESERVISION/LUTEIN Caps Take 1 capsule by mouth 2 (two) times daily. In the morning & afternoon   ZETIA 10 MG tablet Generic drug:  ezetimibe Take 10 mg by mouth daily at 2 PM.       Disposition: Home Discharge Instructions    Diet - low sodium heart healthy    Complete by:  As directed    Increase activity slowly    Complete by:  As directed      Follow-up Information    Deboraha Sprang, MD Follow up on 07/28/2017.   Specialty:  Cardiology Why:  3:00PM Contact information: [address] [address] [phone]        [facility] [facility] Follow up on 04/30/2017.   Specialty:  Cardiology Why:  10:30AM, wound check Contact information: [address] [place] [phone]          Duration of Discharge Encounter: Greater than 30 minutes including physician time.  Venetia Night, PA-C 04/21/2017 8:54 AM

## 2017-04-20 NOTE — Care Management Note (Signed)
Case Management Note  Patient Details  Name: [name] MRN: [id number] Date of Birth: 07-Dec-1945  Subjective/Objective:    From home alone, pta indep, s/p BIV upgrade, she has PCP , Carol Ada and she has medication coverage.                  Action/Plan: NCM will follow for dc needs.   Expected Discharge Date:                  Expected Discharge Plan:  Home/Self Care  In-House Referral:  NA  Discharge planning Services  CM Consult  Post Acute Care Choice:    Choice offered to:  NA  DME Arranged:  N/A DME Agency:  NA  HH Arranged:  NA HH Agency:  NA  Status of Service:  Completed, signed off  If discussed at [place] of Stay Meetings, dates discussed:    Additional Comments:  Zenon Mayo, RN 04/20/2017, 3:47 PM

## 2017-04-20 NOTE — Interval H&P Note (Signed)
ICD Criteria  Current LVEF:25%. Within 12 months prior to implant: Yes   Heart failure history: Yes, Class III  Cardiomyopathy history: Yes, Non-Ischemic Cardiomyopathy.  Atrial Fibrillation/Atrial Flutter: Yes, Paroxysmal.  Ventricular tachycardia history: Yes, Hemodynamic instability present. VT Type is unknown.  Cardiac arrest history: No.  History of syndromes with risk of sudden death: No.  Previous ICD: Yes, Reason for ICD:  Secondary prevention.  Current ICD indication: Secondary  PPM indication: Yes. Pacing type: Both. Greater than 40% RV pacing requirement anticipated. Indication: Complete Heart Block   Class I or II Bradycardia indication present: Yes  Beta Blocker therapy for 3 or more months: Yes, prescribed.   Ace Inhibitor/ARB therapy for 3 or more months: No, medical reason.  History and Physical Interval Note:  04/20/2017 12:43 PM  [name]  has presented today for surgery, with the diagnosis of heart failure  The various methods of treatment have been discussed with the patient and family. After consideration of risks, benefits and other options for treatment, the patient has consented to  Procedure(s): BiV Upgrade (N/A) as a surgical intervention .  The patient's history has been reviewed, patient examined, no change in status, stable for surgery.  I have reviewed the patient's chart and labs.  Questions were answered to the patient's satisfaction.     Virl Axe

## 2017-04-21 ENCOUNTER — Ambulatory Visit (HOSPITAL_COMMUNITY): Payer: Medicare Other

## 2017-04-21 ENCOUNTER — Encounter (HOSPITAL_COMMUNITY): Payer: Self-pay | Admitting: Internal Medicine

## 2017-04-21 DIAGNOSIS — I442 Atrioventricular block, complete: Secondary | ICD-10-CM | POA: Diagnosis not present

## 2017-04-21 DIAGNOSIS — I480 Paroxysmal atrial fibrillation: Secondary | ICD-10-CM | POA: Diagnosis not present

## 2017-04-21 DIAGNOSIS — I428 Other cardiomyopathies: Secondary | ICD-10-CM | POA: Diagnosis not present

## 2017-04-21 DIAGNOSIS — D8685 Sarcoid myocarditis: Secondary | ICD-10-CM | POA: Diagnosis not present

## 2017-04-21 NOTE — Progress Notes (Addendum)
Dr [name] and Joseph Art PA aware of pt planning to drive herself home and discussed it with her.  Pt states she drove herself home the last time also. Pt instructed by PA to only drive straight home (which is close by) and not to drive anymore until instructed by her d/c instructions.

## 2017-04-21 NOTE — Progress Notes (Signed)
Discharge instructions reviewed with pt.  Copy of instructions given to pt, no scripts for pt.  Pt d/c'd via wheelchair with belongings.         Escorted by staff.

## 2017-04-30 ENCOUNTER — Ambulatory Visit (INDEPENDENT_AMBULATORY_CARE_PROVIDER_SITE_OTHER): Payer: Medicare Other | Admitting: *Deleted

## 2017-04-30 DIAGNOSIS — Z950 Presence of cardiac pacemaker: Secondary | ICD-10-CM

## 2017-04-30 DIAGNOSIS — I5022 Chronic systolic (congestive) heart failure: Secondary | ICD-10-CM | POA: Diagnosis not present

## 2017-04-30 DIAGNOSIS — I442 Atrioventricular block, complete: Secondary | ICD-10-CM | POA: Diagnosis not present

## 2017-04-30 LAB — CUP PACEART INCLINIC DEVICE CHECK
Battery Voltage: 2.99 V
Brady Statistic RA Percent Paced: 95 %
Brady Statistic RV Percent Paced: 99.99 %
Implantable Lead Location: 753858
Implantable Lead Location: 753859
Implantable Lead Location: 753860
Lead Channel Impedance Value: 337.5 Ohm
Lead Channel Impedance Value: 887.5 Ohm
Lead Channel Pacing Threshold Amplitude: 0.5 V
Lead Channel Pacing Threshold Pulse Width: 0.4 ms
Lead Channel Pacing Threshold Pulse Width: 0.5 ms
Lead Channel Setting Pacing Amplitude: 2.125
Lead Channel Setting Pacing Pulse Width: 0.5 ms
Lead Channel Setting Sensing Sensitivity: 4 mV
MDC IDC LEAD IMPLANT DT: 19990414
MDC IDC LEAD IMPLANT DT: 19990414
MDC IDC LEAD IMPLANT DT: 20180716
MDC IDC MSMT BATTERY REMAINING LONGEVITY: 80 mo
MDC IDC MSMT LEADCHNL LV PACING THRESHOLD AMPLITUDE: 2.5 V
MDC IDC MSMT LEADCHNL LV PACING THRESHOLD PULSEWIDTH: 0.6 ms
MDC IDC MSMT LEADCHNL RA IMPEDANCE VALUE: 462.5 Ohm
MDC IDC MSMT LEADCHNL RV PACING THRESHOLD AMPLITUDE: 1.125 V
MDC IDC PG IMPLANT DT: 20150330
MDC IDC PG SERIAL: 7610861
MDC IDC SESS DTM: 20180726113306
MDC IDC SET LEADCHNL LV PACING AMPLITUDE: 3.25 V
MDC IDC SET LEADCHNL LV PACING PULSEWIDTH: 0.6 ms
MDC IDC SET LEADCHNL RA PACING AMPLITUDE: 2 V

## 2017-04-30 NOTE — Progress Notes (Signed)
Wound check appointment, s/p LV lead placement. Steri-strips removed. Wound without redness or edema. Incision edges approximated, wound well healed. Normal device function. RA and RV lead tests stable, outputs at chronic settings. LV impedance consistent with implant measurements, threshold now 2.5V @ 0.23ms; autocapture on for extra safety margin until 3 month visit. Histogram distribution appropriate for patient and level of activity. BiV pacing >99%. Thoracic impedance returning to baseline, patient reports mild abdominal bloating that is improving. No mode switches or high ventricular rates noted, +Eliquis. Patient educated about wound care, arm mobility, lifting restrictions. ROV with SK on 07/28/17.

## 2017-05-07 ENCOUNTER — Other Ambulatory Visit: Payer: Self-pay | Admitting: Internal Medicine

## 2017-05-22 ENCOUNTER — Telehealth: Payer: Self-pay | Admitting: *Deleted

## 2017-05-22 NOTE — Telephone Encounter (Signed)
Spoke with patient regarding AT/AF burden alert received via [place].  Unable to review alert due to RF telemetry time-out (likely due to excessive RF tele use during BiV upgrade 04/20/17 per Lorie Phenix. Jude rep).  Patient is on Eliquis.  She denies any new or worsening symptoms, but does report ongoing mild abdominal bloating, previously reported at wound check as improving, which she attributes to fluid retention.  No ShOB, chest discomfort, or edema noted elsewhere.  Patient reports that she has not felt an improvement in symptoms since her BiV upgrade.  Patient is agreeable to appointment with Chanetta Marshall, NP on 06/04/17 at 10:20am.  Patient agrees to call if she has any new or worsening symptoms prior to this appointment.  She is appreciative of call and denies additional questions or concerns at this time.

## 2017-05-25 ENCOUNTER — Ambulatory Visit (INDEPENDENT_AMBULATORY_CARE_PROVIDER_SITE_OTHER): Payer: Medicare Other | Admitting: Pulmonary Disease

## 2017-05-25 ENCOUNTER — Encounter: Payer: Self-pay | Admitting: Pulmonary Disease

## 2017-05-25 VITALS — BP 116/78 | HR 71 | Ht 63.0 in | Wt 196.8 lb

## 2017-05-25 DIAGNOSIS — G4733 Obstructive sleep apnea (adult) (pediatric): Secondary | ICD-10-CM | POA: Diagnosis not present

## 2017-05-25 NOTE — Patient Instructions (Signed)
Will arrange for new CPAP set up  Follow up in 2 months with Dr. Halford Chessman or Nurse Practitioner

## 2017-05-25 NOTE — Progress Notes (Signed)
Current Outpatient Prescriptions on File Prior to Visit  Medication Sig  . albuterol (PROAIR HFA) 108 (90 BASE) MCG/ACT inhaler Inhale 2 puffs into the lungs every 6 (six) hours as needed for wheezing.  . bimatoprost (LUMIGAN) 0.01 % SOLN Place 1 drop into the right eye at bedtime.   . carvedilol (COREG) 25 MG tablet TAKE 1 TABLET BY MOUTH TWICE DAILY WITH A MEAL  . clindamycin (CLEOCIN) 150 MG capsule Take 600 mg by mouth See admin instructions. Prior to dental procedures   . cycloSPORINE (RESTASIS) 0.05 % ophthalmic emulsion Place 1 drop into both eyes 2 (two) times daily.   Marland Kitchen ELIQUIS 5 MG TABS tablet TAKE 1 TABLET BY MOUTH TWICE DAILY  . ezetimibe (ZETIA) 10 MG tablet Take 10 mg by mouth daily at 2 PM.   . famotidine (PEPCID) 20 MG tablet TAKE 1 TABLET BY MOUTH TWICE DAILY (Patient taking differently: TAKE 1 TABLET BY MOUTH TWICE DAILY (in the morning & in the afternoon))  . furosemide (LASIX) 20 MG tablet TAKE 1 TABLET EVERY DAY FOR SWELLING AND SHORTNESS OF BREATH (Patient taking differently: Take 20 mg by mouth daily as needed (for swelling/shortness of breath). )  . hydroxychloroquine (PLAQUENIL) 200 MG tablet Take 400 mg by mouth daily.   Marland Kitchen loteprednol (LOTEMAX) 0.5 % ophthalmic suspension Place 1 drop into both eyes 4 (four) times daily.   . Multiple Vitamins-Minerals (PRESERVISION/LUTEIN) CAPS Take 1 capsule by mouth 2 (two) times daily. In the morning & afternoon  . nitroGLYCERIN (NITROSTAT) 0.4 MG SL tablet Place 0.4 mg under the tongue every 5 (five) minutes as needed for chest pain. For chest pain   No current facility-administered medications on file prior to visit.      Chief Complaint  Patient presents with  . Follow-up    Pt did not get the get the oral appliance d/t financial reasons. Pt states that her sleep has been okay since last seen. Pt notes some wheezing and coughing. Little mucus production - white in color.     Cardiac tests Echo 03/04/17 >> EF 25 to  30%  Pulmonary tests Lung biopsy 1984 >> consistent with sarcoidosis PFT 02/13/12>>FEV1 1.03 (49%), FEV1% 76, TLC 3.30 (71%), DLCO 49%, positive BD response.  05/13/13PPD negative  07/24/13Start MTX >>stopped November 2013 due to pruritis PFT 08/10/12>>FEV1 1.08 (52%), FEV1% 70, TLC 2.77 (59%), DLCO 44%, no BD PFT 04/21/13>>FEV1 1.05 (55%), FEV1% 75, TLC 2.70 (55%), DLCO 63%, no BD   CT chests CT chest 01/21/12>>b/l hilar and mediastinal nodes, diffuse peribronchovascular and perifissural nodularity with mild associated septal thickening. CT chest 08/06/12>>mediastinal/hilar LAN, diffuse peribronchovascular and perfissural nodularity  Sleep tests PSG 06/19/14 >> AHI 9.1, SpO2 low 74% HST 08/20/16 >> AHI 13.6, SaO2 low 75%  Past medical history Atrial tachycardia, systolic CHF, diastolic CHF, CKD, CHB s/p PM, Glaucoma, HLD, HTN  Past surgical history, Family history, Social history, Allergies all reviewed  Vital Signs BP 116/78 (BP Location: Left Arm, Cuff Size: Normal)   Pulse 71   Ht 5\' 3"  (1.6 m)   Wt 196 lb 12.8 oz (89.3 kg)   SpO2 98%   BMI 34.86 kg/m   History of Present Illness [name] is a 71 y.o. female former smoker with cough, UACS, sarcoidosis, OSA.  Since her last visit she was found to have worsening systolic heart function.  She is followed by cardiology.  She was seen by Dr. Ron Kline for oral appliance, but was told she would have to cover  half the expense.  She was not able to afford this.  She is still having snoring, sleep disruption, apnea, and daytime sleepiness.  Epworth score is 11 out of 24.  She has notice more cough since she was in hospital getting pacer adjusted.  Physical Exam  General - pleasant Eyes - pupils reactive ENT - no sinus tenderness, no oral exudate, no LAN, MP 4, scalloped tongue Cardiac - regular, no murmur Chest - no wheeze, rales Abd - soft, non tender Ext - no edema Skin - no rashes Neuro - normal  strength Psych - normal mood  CXR 04/21/17 >> ATX   Assessment/Plan  Obstructive sleep apnea. - will arrange for auto CPAP set up - explained she might need repeat home sleep study prior to set if this is required by her insurance  Upper airway cough syndrome. 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 - f/u with cardiology  Sarcoidosis - she is on plaquenil per Dr. Trudie Kline with rheumatology   Patient Instructions  Will arrange for new CPAP set up  Follow up in 2 months with Dr. Halford Kline or Nurse Practitioner    Brenda Mires, MD [place] Pager:  [phone] 05/25/2017, 2:58 PM

## 2017-05-26 ENCOUNTER — Telehealth: Payer: Self-pay | Admitting: Pulmonary Disease

## 2017-05-26 DIAGNOSIS — G4733 Obstructive sleep apnea (adult) (pediatric): Secondary | ICD-10-CM

## 2017-05-26 NOTE — Telephone Encounter (Signed)
Per Corene Cornea at [facility] the sleep study has to be within 6 months of ordering a cpap.   Spoke with pt, aware of recs.   Hst ordered.  Nothing further needed at this time.

## 2017-05-26 NOTE — Telephone Encounter (Signed)
She actually had a home sleep study from 08/20/16.  Can you check if this is recent enough to get CPAP set up.  If not, then please order another home sleep study.

## 2017-05-26 NOTE — Telephone Encounter (Signed)
Because sleep study is 69 years old, pt will need to "start over" with a new sleep study in order to qualify for cpap.    VS please advise on what type of sleep study to order.  Thanks.

## 2017-05-29 DIAGNOSIS — G4733 Obstructive sleep apnea (adult) (pediatric): Secondary | ICD-10-CM | POA: Diagnosis not present

## 2017-06-03 ENCOUNTER — Telehealth: Payer: Self-pay | Admitting: Pulmonary Disease

## 2017-06-03 DIAGNOSIS — G4733 Obstructive sleep apnea (adult) (pediatric): Secondary | ICD-10-CM

## 2017-06-03 NOTE — Telephone Encounter (Signed)
HST 05/29/17 >> AHI 16.8, SaO2 low 75%   Will have my nurse inform pt that sleep study shows moderate sleep apnea.  Please send order for auto CPAP range 5 to 15 cm H2O with heated humidity and mask of choice.  Have download sent 1 month after starting CPAP and set up ROV 2 months after starting CPAP.  ROV can be with me or NP.

## 2017-06-03 NOTE — Progress Notes (Signed)
Electrophysiology Office Note Date: 06/04/2017  ID:  [name], [name] 1946/07/10, MRN [id number]  PCP: Carol Ada, MD Electrophysiologist: [name]  CC: 6 week CRT follow-up/AF on device  [name] is a 71 y.o. female seen today for Dr [name].  She presents today for routine electrophysiology followup. She underwent upgrade to [facility] 04/2017.  Since device upgrade, the patient reports doing relatively well.  She had a tough week last week with increased shortness of breath and fatigue that correlates with increased AF burden.  She is back in SR today and has felt better for the last few days.  She denies chest pain, PND, orthopnea, nausea, vomiting, dizziness, syncope, edema, weight gain, or early satiety.  Device History: STJ dual chamber PPM implanted 1999 for CHB; gen change 2006; gen change 2015; upgrade to CRTP 2018   Past Medical History:  Diagnosis Date  . Arthritis    "all over" (04/20/2017)  . Atrial tachycardia ([place])   . CHF (congestive heart failure) ([place])   . CKD (chronic kidney disease), stage III   . Complete heart block (HCC)    a. s/p STJ dual chamber pacemaker 1999; upgrade to CRTP 2018  . Diverticulosis   . Glaucoma   . HLD (hyperlipidemia)   . HTN (hypertension)   . Iritis   . NICM (nonischemic cardiomyopathy) ([place])    echo 1/11: EF 35-40%, mod LVH, Grade 1 diast dysfxn, mild BAE;    cath 2/06: luminal irregs;  Myoview 10/2:  no scar or ischemia, EF 37%   . OSA (obstructive sleep apnea) 08/21/2016   "never RX'd mask" (04/20/2017)  . Sarcoidosis of lung ([place])   . Stroke [facility]) ~ 2016   "mini-stroke"; denies residual on 04/20/2017)  . Tuberculosis 1950s/1960s?   Past Surgical History:  Procedure Laterality Date  . BIV UPGRADE N/A 04/20/2017   Procedure: BiV Upgrade;  Surgeon: Deboraha Sprang, MD;  Location: [place] CV LAB;  Service: Cardiovascular;  Laterality: N/A;  . CARDIAC CATHETERIZATION    . EYE SURGERY    . GLAUCOMA SURGERY Right   .  INSERT / REPLACE / [place]; 12/26/13   STJ dual chamber pacemaker implanted 1999; gen change 12/2013 by Dr [name] - STJ   . PERMANENT PACEMAKER GENERATOR CHANGE N/A 01/02/2014   Procedure: PERMANENT PACEMAKER GENERATOR CHANGE;  Surgeon: Deboraha Sprang, MD;  Location[facility] CATH LAB;  Service: Cardiovascular;  Laterality: N/A;  . TUBAL LIGATION    . VAGINAL HYSTERECTOMY      Current Outpatient Prescriptions  Medication Sig Dispense Refill  . albuterol (PROAIR HFA) 108 (90 BASE) MCG/ACT inhaler Inhale 2 puffs into the lungs every 6 (six) hours as needed for wheezing. 1 Inhaler 3  . bimatoprost (LUMIGAN) 0.01 % SOLN Place 1 drop into the right eye at bedtime.     . carvedilol (COREG) 25 MG tablet TAKE 1 TABLET BY MOUTH TWICE DAILY WITH A MEAL 180 tablet 3  . clindamycin (CLEOCIN) 150 MG capsule Take 600 mg by mouth See admin instructions. Prior to dental procedures     . cycloSPORINE (RESTASIS) 0.05 % ophthalmic emulsion Place 1 drop into both eyes 2 (two) times daily.     Marland Kitchen ELIQUIS 5 MG TABS tablet TAKE 1 TABLET BY MOUTH TWICE DAILY 60 tablet 5  . ezetimibe (ZETIA) 10 MG tablet Take 10 mg by mouth daily at 2 PM.     . famotidine (PEPCID) 20 MG tablet TAKE 1 TABLET BY MOUTH  TWICE DAILY (Patient taking differently: TAKE 1 TABLET BY MOUTH TWICE DAILY (in the morning & in the afternoon)) 60 tablet 6  . furosemide (LASIX) 20 MG tablet TAKE 1 TABLET EVERY DAY FOR SWELLING AND SHORTNESS OF BREATH (Patient taking differently: Take 20 mg by mouth daily as needed (for swelling/shortness of breath). ) 90 tablet 3  . hydroxychloroquine (PLAQUENIL) 200 MG tablet Take 400 mg by mouth daily.     Marland Kitchen loteprednol (LOTEMAX) 0.5 % ophthalmic suspension Place 1 drop into both eyes 4 (four) times daily.     . Multiple Vitamins-Minerals (PRESERVISION/LUTEIN) CAPS Take 1 capsule by mouth 2 (two) times daily. In the morning & afternoon    . nitroGLYCERIN (NITROSTAT) 0.4 MG SL tablet Place 0.4 mg under the tongue  every 5 (five) minutes as needed for chest pain. For chest pain     No current facility-administered medications for this visit.     Allergies:   Morphine and related; Codeine; and Penicillins   Social History: Social History   Social History  . Marital status: Legally Separated    Spouse name: N/A  . Number of children: 5  . Years of education: N/A   Occupational History  . disabled    Social History Main Topics  . Smoking status: Former Smoker    Packs/day: 0.50    Years: 8.00    Types: Cigarettes    Quit date: 10/06/1988  . Smokeless tobacco: Never Used  . Alcohol use No  . Drug use: No  . Sexual activity: No   Other Topics Concern  . Not on file   Social History Narrative   Retired.     Family History: Family History  Problem Relation Age of Onset  . Heart disease Father   . Asthma Father   . Rheum arthritis Mother   . Diabetes Sister   . Colon cancer Brother   . Colon cancer Brother   . Coronary artery disease Sister   . Coronary artery disease Sister      Review of Systems: All other systems reviewed and are otherwise negative except as noted above.   Physical Exam: VS:  There were no vitals taken for this visit. , BMI There is no height or weight on file to calculate BMI.  GEN- The patient is well appearing, alert and oriented x 3 today.   HEENT: normocephalic, atraumatic; sclera clear, conjunctiva pink; hearing intact; oropharynx clear; neck supple  Lungs- Clear to ausculation bilaterally, normal work of breathing.  No wheezes, rales, rhonchi Heart- Regular rate and rhythm(paced) GI- soft, non-tender, non-distended, bowel sounds present  Extremities- no clubbing, cyanosis, or edema  MS- no significant deformity or atrophy Skin- warm and dry, no rash or lesion; PPM pocket well healed Psych- euthymic mood, full affect Neuro- strength and sensation are intact  PPM Interrogation- reviewed in detail today,  See PACEART report  EKG:  EKG is  ordered today. The ekg ordered today shows AV pacing (similar to post procedure EKG)  Recent Labs: 02/18/2017: Magnesium 2.3; TSH 2.540 04/13/2017: BUN 23; Creatinine, Ser 2.17; Hemoglobin 10.2; Platelets 225; Potassium 4.8; Sodium 143   Wt Readings from Last 3 Encounters:  05/25/17 196 lb 12.8 oz (89.3 kg)  04/21/17 180 lb 12.4 oz (82 kg)  03/13/17 199 lb 6.4 oz (90.4 kg)     Other studies Reviewed: Additional studies/ records that were reviewed today include: Dr Olin Pia office notes, hospital records   Assessment and Plan:  1.  Complete heart block  Normal PPM function See Claudia Desanctis Art report No changes today  2.  Chronic systolic heart failure Somewhat improved this week post CRT implant Euvolemic on exam Continue current therapy Repeat echo 10/2017 (6 months post CRT). EKG same as post procedure. Consider VV opt at next office visit with Dr [name] by EKG.  Enroll in [facility]  3.  CKD, stage IV Followed by nephrology  4.  HTN Stable No change required today  5.  Paroxysmal atrial fibrillation Burden by device interrogation 1.2% V rates controlled CHADS2VASC is at least 4, continue Eliquis She is clearly symptomatic with AF. Her only AAD option is likely amiodarone. We discussed today that if she has increased AF burden, may need to consider AAD therapy.  AF alert decreased to 12 hours today as patient is symptomatic and may require earlier intervention   Current medicines are reviewed at length with the patient today.   The patient does not have concerns regarding her medicines.  The following changes were made today:  none  Labs/ tests ordered today include: echo 10/2017 No orders of the defined types were placed in this encounter.    Disposition:   Follow up with Dr [name] as scheduled    Signed, Chanetta Marshall, NP 06/04/2017 6:39 AM  [address] [place] [id number] [phone] (office) [phone] (fax)

## 2017-06-04 ENCOUNTER — Encounter: Payer: Self-pay | Admitting: Nurse Practitioner

## 2017-06-04 ENCOUNTER — Ambulatory Visit (INDEPENDENT_AMBULATORY_CARE_PROVIDER_SITE_OTHER): Payer: Medicare Other | Admitting: Nurse Practitioner

## 2017-06-04 ENCOUNTER — Other Ambulatory Visit: Payer: Self-pay | Admitting: *Deleted

## 2017-06-04 VITALS — BP 96/64 | HR 73 | Ht 63.0 in | Wt 198.0 lb

## 2017-06-04 DIAGNOSIS — I480 Paroxysmal atrial fibrillation: Secondary | ICD-10-CM

## 2017-06-04 DIAGNOSIS — G4733 Obstructive sleep apnea (adult) (pediatric): Secondary | ICD-10-CM

## 2017-06-04 DIAGNOSIS — I442 Atrioventricular block, complete: Secondary | ICD-10-CM | POA: Diagnosis not present

## 2017-06-04 DIAGNOSIS — I5022 Chronic systolic (congestive) heart failure: Secondary | ICD-10-CM

## 2017-06-04 DIAGNOSIS — N184 Chronic kidney disease, stage 4 (severe): Secondary | ICD-10-CM | POA: Diagnosis not present

## 2017-06-04 LAB — CUP PACEART INCLINIC DEVICE CHECK
Date Time Interrogation Session: 20180830100444
Implantable Lead Implant Date: 19990414
Implantable Lead Location: 753858
Implantable Pulse Generator Implant Date: 20150330
MDC IDC LEAD IMPLANT DT: 19990414
MDC IDC LEAD IMPLANT DT: 20180716
MDC IDC LEAD LOCATION: 753859
MDC IDC LEAD LOCATION: 753860
MDC IDC PG SERIAL: 7610861

## 2017-06-04 NOTE — Patient Instructions (Signed)
Medication Instructions:  Your physician recommends that you continue on your current medications as directed. Please refer to the Current Medication list given to you today.   Labwork: None Ordered   Testing/Procedures: None Ordered   Follow-Up: Your physician recommends that you schedule a follow-up appointment in: as scheduled with Dr. [name].  Any Other Special Instructions Will Be Listed Below (If Applicable).     If you need a refill on your cardiac medications before your next appointment, please call your pharmacy.

## 2017-06-05 NOTE — Telephone Encounter (Signed)
Results have been explained to patient, pt expressed understanding.  Order placed for CPAP  Appt scheduled with Dr Halford Chessman on  08/14/17 at 2pm. Nothing further needed.

## 2017-06-22 ENCOUNTER — Other Ambulatory Visit: Payer: Self-pay | Admitting: Internal Medicine

## 2017-06-22 NOTE — Telephone Encounter (Signed)
Pt last saw Dr [name] 03/13/17, last labs 04/13/17 Creat 2.17, age 71, weight 89.8kg, based on specified criteria pt is on appropriate dosage of Eliquis 5mg  BID. Will refill rx.

## 2017-06-26 ENCOUNTER — Telehealth: Payer: Self-pay

## 2017-06-26 NOTE — Telephone Encounter (Signed)
Patient referred to [facility] clinic by Chanetta Marshall, NP.  Call to patient and provided ICM intro.  She agreed to monthly ICM follow up.  She reported she feeling ok at this time.  When she has fluid it is normally in her belly area.  Unable to provide direct number today because he is not at home and will provide at next call. 1st ICM remote transmission scheduled for 07-20-2017.

## 2017-07-20 ENCOUNTER — Ambulatory Visit (INDEPENDENT_AMBULATORY_CARE_PROVIDER_SITE_OTHER): Payer: Medicare Other

## 2017-07-20 ENCOUNTER — Telehealth: Payer: Self-pay

## 2017-07-20 DIAGNOSIS — I5022 Chronic systolic (congestive) heart failure: Secondary | ICD-10-CM

## 2017-07-20 DIAGNOSIS — Z950 Presence of cardiac pacemaker: Secondary | ICD-10-CM | POA: Diagnosis not present

## 2017-07-20 NOTE — Progress Notes (Signed)
Patient returned call.  Transmission reviewed.  She said she takes Furosemide daily as prescribed.  Advised it appears she has some episodes that she has been out of NSR and asked if she has noticed changes. She said yes, she has been able to tell because her heart beats faster and she feels extremely tired.  She plans on discussing with Dr [name] at appointment on 07/28/2017.  She has ICM number and encouraged to call for any changes.

## 2017-07-20 NOTE — Progress Notes (Addendum)
EPIC Encounter for ICM Monitoring  Patient Name: [name] is a 71 y.o. female Date: 07/20/2017 Primary Care Physican: Carol Ada, MD Primary Cardiologist: [name] Electrophysiologist: [name] Dry Weight: unknown Bi-V Pacing:  >99%   AT/AF Burden: 30% (Burden increasing since office visit with Chanetta Marshall, NP on 06/04/2017)      1st ICM remote transmission.  Attempted call to patient and unable to reach.  Left message to return call.  Transmission reviewed.    Thoracic impedance normal.      Prescribed dosage: Furosemide 20 mg take 1 tablet daily for swelling and shortness of breath.   Recommendations: NONE - Unable to reach patient   Follow-up plan: ICM clinic phone appointment on 09/03/2017.  Office appointment scheduled 07/28/2017 with Dr. [name].  Copy of ICM check sent to Dr. [name] and Chanetta Marshall, NP.  3 month ICM trend: 07/20/2017   AT/AF   1 Year ICM trend:      [name], RN 07/20/2017 1:36 PM

## 2017-07-20 NOTE — Telephone Encounter (Signed)
Remote ICM transmission received.  Attempted call to patient and left message to return call. 

## 2017-07-28 ENCOUNTER — Ambulatory Visit (INDEPENDENT_AMBULATORY_CARE_PROVIDER_SITE_OTHER): Payer: Medicare Other | Admitting: Internal Medicine

## 2017-07-28 ENCOUNTER — Encounter: Payer: Self-pay | Admitting: Internal Medicine

## 2017-07-28 VITALS — BP 114/76 | HR 60 | Ht 60.0 in | Wt 197.6 lb

## 2017-07-28 DIAGNOSIS — Z23 Encounter for immunization: Secondary | ICD-10-CM | POA: Diagnosis not present

## 2017-07-28 DIAGNOSIS — I428 Other cardiomyopathies: Secondary | ICD-10-CM

## 2017-07-28 DIAGNOSIS — I480 Paroxysmal atrial fibrillation: Secondary | ICD-10-CM

## 2017-07-28 DIAGNOSIS — Z950 Presence of cardiac pacemaker: Secondary | ICD-10-CM | POA: Diagnosis not present

## 2017-07-28 DIAGNOSIS — I5022 Chronic systolic (congestive) heart failure: Secondary | ICD-10-CM

## 2017-07-28 DIAGNOSIS — I442 Atrioventricular block, complete: Secondary | ICD-10-CM

## 2017-07-28 NOTE — Progress Notes (Signed)
Patient Care Team: Carol Ada, MD as PCP - General (Family Medicine)   HPI  [name] is a 71 y.o. 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  She also has a history of atrial arrhythmias for which he takes apixoban,  SCAF was noted 5/17. Episode duration was 12 hours or so.  Plavix was initiated According to the patient  following a TIA by primary care physician in.  Because of ongoing problems with congestive heart failure she underwent CRT upgrade 7/18.  She is not sure that she has felt any better following upgrade. She has more shortness of breath and fatigue. She has not had edema or fevers.      DATE TEST    4/13 Echo   EF 35-40 %   3/15 Echo   EF 40-45 %   5/18 Echo EF 25-30%    She was 100% ventricularly paced with a QRS duration of 198 msec     Date Cr K Mg Hgb  6/15  2.3 4.5     6/17  1.82* 5.2     4/18  1.91 5.1 2.3 10.8  7/18 2.17 4.8      Above labs obtained and personally reviewed  She saw nephrology yesterday and apparently her renal function was "a little bit worse "      Past Medical History:  Diagnosis Date  . Arthritis    "all over" (04/20/2017)  . Atrial tachycardia ([place])   . CHF (congestive heart failure) ([place])   . CKD (chronic kidney disease), stage III ([place])   . Complete heart block (HCC)    a. s/p STJ dual chamber pacemaker 1999; upgrade to CRTP 2018  . Diverticulosis   . Glaucoma   . HLD (hyperlipidemia)   . HTN (hypertension)   . Iritis   . NICM (nonischemic cardiomyopathy) ([place])    echo 1/11: EF 35-40%, mod LVH, Grade 1 diast dysfxn, mild BAE;    cath 2/06: luminal irregs;  Myoview 10/2:  no scar or ischemia, EF 37%   . OSA (obstructive sleep apnea) 08/21/2016   "never RX'd mask" (04/20/2017)  . Paroxysmal atrial fibrillation (HCC)   . Sarcoidosis of lung ([place])   . Stroke [facility]) ~ 2016   "mini-stroke"; denies residual on 04/20/2017)    . Tuberculosis 1950s/1960s?    Past Surgical History:  Procedure Laterality Date  . BIV UPGRADE N/A 04/20/2017   Procedure: BiV Upgrade;  Surgeon: Deboraha Sprang, MD;  Location: [place] CV LAB;  Service: Cardiovascular;  Laterality: N/A;  . CARDIAC CATHETERIZATION    . EYE SURGERY    . GLAUCOMA SURGERY Right   . INSERT / REPLACE / [place]; 12/26/13   STJ dual chamber pacemaker implanted 1999; gen change 12/2013 by Dr [name] - STJ   . PERMANENT PACEMAKER GENERATOR CHANGE N/A 01/02/2014   Procedure: PERMANENT PACEMAKER GENERATOR CHANGE;  Surgeon: Deboraha Sprang, MD;  Location: [facility] CATH LAB;  Service: Cardiovascular;  Laterality: N/A;  . TUBAL LIGATION    . VAGINAL HYSTERECTOMY      Current Outpatient Prescriptions  Medication Sig Dispense Refill  . albuterol (PROAIR HFA) 108 (90 BASE) MCG/ACT inhaler Inhale 2 puffs into the lungs every 6 (six) hours as needed for wheezing. 1 Inhaler 3  . bimatoprost (LUMIGAN) 0.01 % SOLN Place 1 drop into the right eye at bedtime.     Marland Kitchen  carvedilol (COREG) 25 MG tablet TAKE 1 TABLET BY MOUTH TWICE DAILY WITH A MEAL 180 tablet 3  . clindamycin (CLEOCIN) 150 MG capsule Take 600 mg by mouth See admin instructions. Prior to dental procedures     . cycloSPORINE (RESTASIS) 0.05 % ophthalmic emulsion Place 1 drop into both eyes 2 (two) times daily.     Marland Kitchen ELIQUIS 5 MG TABS tablet TAKE 1 TABLET BY MOUTH TWICE DAILY 60 tablet 9  . ezetimibe (ZETIA) 10 MG tablet Take 10 mg by mouth daily at 2 PM.     . famotidine (PEPCID) 20 MG tablet TAKE 1 TABLET BY MOUTH TWICE DAILY (Patient taking differently: TAKE 1 TABLET BY MOUTH TWICE DAILY (in the morning & in the afternoon)) 60 tablet 6  . furosemide (LASIX) 20 MG tablet Take 20 mg by mouth daily.    . hydroxychloroquine (PLAQUENIL) 200 MG tablet Take 400 mg by mouth daily.     Marland Kitchen loteprednol (LOTEMAX) 0.5 % ophthalmic suspension Place 1 drop into both eyes 4 (four) times daily.     . Multiple  Vitamins-Minerals (PRESERVISION/LUTEIN) CAPS Take 1 capsule by mouth 2 (two) times daily. In the morning & afternoon    . nitroGLYCERIN (NITROSTAT) 0.4 MG SL tablet Place 0.4 mg under the tongue every 5 (five) minutes as needed for chest pain. For chest pain     No current facility-administered medications for this visit.     Allergies  Allergen Reactions  . Morphine And Related Anaphylaxis    Throat closing  . Codeine Itching  . Penicillins Itching    Has patient had a PCN reaction causing immediate rash, facial/tongue/throat swelling, SOB or lightheadedness with hypotension: No Has patient had a PCN reaction causing severe rash involving mucus membranes or skin necrosis: No Has patient had a PCN reaction that required hospitalization: No Has patient had a PCN reaction occurring within the last 10 years: No If all of the above answers are "NO", then may proceed with Cephalosporin use.     Review of Systems negative except from HPI and PMH  Physical Exam BP 114/76   Pulse 60   Ht 5' (1.524 m)   Wt 197 lb 9.6 oz (89.6 kg)   BMI 38.59 kg/m  Well developed and nourished in no acute distress HENT normal Neck supple with JVP-6-7 Clear Regular rate and rhythm, no murmurs or gallops Abd-soft with active BS No Clubbing cyanosis edema Skin-warm and dry A & Oriented  Grossly normal sensory and motor function    ECG demonstrates AV  pacing at 60 Intervals 24/19/43 Upright QRS V1 neg 1  Following the shortening of the LV offset 50--25 the QRS duration went from 198--184.  Assessment and  Plan  Nonischemic cardiomyopathy   Sarcoid heart disease   Atrial fibrillation //SCAF   Hypertension  Complete heart block   Renal Insufficiency  Grade 4   Sinus node dysfunction  Congestive Heart failure acute chronic 3  GI bleeding    Pacemaker  St Jude     She is status post CRT upgrade without significant interval improvement. Shortening of the LV offset help improve the  echocardiographic parameters. We will also undertake an AV optimization echo. She is not volume overloaded. Her renal function is worsening. This may be contributing.  She continues to have atrial fibrillation about 40-50% of the time.  Amiodarone might be appropriate for rhythm control.  More than 50% of 40 min was spent in counseling related to the above

## 2017-07-28 NOTE — Patient Instructions (Signed)
Medication Instructions: Your physician recommends that you continue on your current medications as directed. Please refer to the Current Medication list given to you today.  Labwork: None Ordered  Procedures/Testing: Your physician has requested that you have an AV OP echocardiogram. Echocardiography is a painless test that uses sound waves to create images of your heart. It provides your doctor with information about the size and shape of your heart and how well your heart's chambers and valves are working. This procedure takes approximately one hour. There are no restrictions for this procedure.  Follow-Up: Your physician recommends that you schedule a follow-up appointment in: 2 MONTHS with Chanetta Marshall, NP   If you need a refill on your cardiac medications before your next appointment, please call your pharmacy.

## 2017-07-31 ENCOUNTER — Ambulatory Visit (HOSPITAL_COMMUNITY): Payer: Medicare Other | Attending: Cardiovascular Disease

## 2017-07-31 ENCOUNTER — Other Ambulatory Visit: Payer: Self-pay

## 2017-07-31 ENCOUNTER — Other Ambulatory Visit: Payer: Self-pay | Admitting: Internal Medicine

## 2017-07-31 DIAGNOSIS — I5022 Chronic systolic (congestive) heart failure: Secondary | ICD-10-CM

## 2017-08-03 ENCOUNTER — Telehealth: Payer: Self-pay

## 2017-08-03 ENCOUNTER — Telehealth: Payer: Self-pay | Admitting: *Deleted

## 2017-08-03 NOTE — Telephone Encounter (Signed)
Pt is aware and agreeable to improvement. She was elated.

## 2017-08-03 NOTE — Telephone Encounter (Signed)
Spoke with patient at Dr. Olin Pia request.  Per manual [place] transmission received this AM at [phone], patient is back in sinus rhythm (presenting Ap/BiVp).  Per Dr. [name], no changes at this time.  Patient is agreeable to plan and is appreciative of call.  She denies questions or concerns at this time.

## 2017-08-04 ENCOUNTER — Other Ambulatory Visit: Payer: Self-pay | Admitting: Internal Medicine

## 2017-08-07 LAB — CUP PACEART INCLINIC DEVICE CHECK
Brady Statistic RA Percent Paced: 61 %
Brady Statistic RV Percent Paced: 99.99 %
Date Time Interrogation Session: 20181023190436
Implantable Lead Implant Date: 20180716
Implantable Lead Location: 753859
Implantable Lead Location: 753860
Implantable Pulse Generator Implant Date: 20150330
Lead Channel Impedance Value: 1162.5 Ohm
Lead Channel Impedance Value: 350 Ohm
Lead Channel Pacing Threshold Pulse Width: 0.4 ms
Lead Channel Pacing Threshold Pulse Width: 0.5 ms
Lead Channel Pacing Threshold Pulse Width: 0.6 ms
Lead Channel Setting Pacing Amplitude: 2.125
Lead Channel Setting Pacing Amplitude: 2.125
Lead Channel Setting Pacing Pulse Width: 0.5 ms
MDC IDC LEAD IMPLANT DT: 19990414
MDC IDC LEAD IMPLANT DT: 19990414
MDC IDC LEAD LOCATION: 753858
MDC IDC MSMT BATTERY REMAINING LONGEVITY: 85 mo
MDC IDC MSMT BATTERY VOLTAGE: 2.98 V
MDC IDC MSMT LEADCHNL LV PACING THRESHOLD AMPLITUDE: 1.25 V
MDC IDC MSMT LEADCHNL RA IMPEDANCE VALUE: 437.5 Ohm
MDC IDC MSMT LEADCHNL RA PACING THRESHOLD AMPLITUDE: 0.5 V
MDC IDC MSMT LEADCHNL RV PACING THRESHOLD AMPLITUDE: 1.25 V
MDC IDC SET LEADCHNL LV PACING PULSEWIDTH: 0.6 ms
MDC IDC SET LEADCHNL RA PACING AMPLITUDE: 2 V
MDC IDC SET LEADCHNL RV SENSING SENSITIVITY: 4 mV
Pulse Gen Model: 3242
Pulse Gen Serial Number: 7610861

## 2017-08-14 ENCOUNTER — Ambulatory Visit (INDEPENDENT_AMBULATORY_CARE_PROVIDER_SITE_OTHER): Payer: Medicare Other | Admitting: Pulmonary Disease

## 2017-08-14 DIAGNOSIS — G4733 Obstructive sleep apnea (adult) (pediatric): Secondary | ICD-10-CM

## 2017-08-14 NOTE — Progress Notes (Signed)
She was schedule to follow up on CPAP therapy.  This wasn't set up yet.  She will get set up with CPAP and have appointment rescheduled.

## 2017-09-03 ENCOUNTER — Ambulatory Visit (INDEPENDENT_AMBULATORY_CARE_PROVIDER_SITE_OTHER): Payer: Medicare Other

## 2017-09-03 DIAGNOSIS — Z950 Presence of cardiac pacemaker: Secondary | ICD-10-CM | POA: Diagnosis not present

## 2017-09-03 DIAGNOSIS — I5022 Chronic systolic (congestive) heart failure: Secondary | ICD-10-CM | POA: Diagnosis not present

## 2017-09-04 ENCOUNTER — Telehealth: Payer: Self-pay

## 2017-09-04 NOTE — Progress Notes (Signed)
EPIC Encounter for ICM Monitoring  Patient Name: [name] is a 71 y.o. female Date: 09/04/2017 Primary Care Physican: Carol Ada, MD Primary Cardiologist: [name] Electrophysiologist: [name] Dry Weight:    unknown Bi-V Pacing:  >99%      Since Jul 28, 2017 AT/AF Burden: 15%     Attempted call to patient and unable to reach.  Left message to return call.  Transmission reviewed.    Thoracic impedance normal but was abnormal suggesting fluid accumulation from 08/25/2017 to 09/02/2017.  Prescribed dosage: Furosemide 20 mg take 1 tablet daily   Recommendations: NONE - Unable to reach.  Follow-up plan: ICM clinic phone appointment on 10/08/2017.  Office appointment scheduled 10/14/2017 with Chanetta Marshall, NP.  Copy of ICM check sent to Dr. [name].   3 month ICM trend: 09/03/2017     AT/AF  _  1 Year ICM trend:       [name], RN 09/04/2017 12:07 PM

## 2017-09-04 NOTE — Telephone Encounter (Signed)
Remote ICM transmission received.  Attempted call to patient and left message to return call. 

## 2017-09-22 ENCOUNTER — Telehealth: Payer: Self-pay

## 2017-09-22 NOTE — Telephone Encounter (Signed)
   Primary Cardiologist: Virl Axe, MD  Chart reviewed as part of pre-operative protocol coverage. Given past medical history and time since last visit, based on ACC/AHA guidelines, Latanza L Menard would be at acceptable risk for the planned procedure without further cardiovascular testing.  I will forward to pharmacy team for recommendations regarding anticoagulation in the setting of prior TIA.  Please call with questions.  Murray Hodgkins, NP 09/22/2017, 4:24 PM

## 2017-09-22 NOTE — Telephone Encounter (Signed)
   [facility] Pre-operative Risk Assessment    Request for surgical clearance:  1. What type of surgery is being performed? Colonoscopy   2. When is this surgery scheduled? 11/18/17   3. Are there any medications that need to be held prior to surgery and how long? Eliquis 24 hours before?   4. Practice name and name of physician performing surgery? [facility] Dr. Therisa Doyne  5. What is your office phone and fax number? Phone- [phone] and Fax [phone]    6. Anesthesia type (None, local, MAC, general) ? Unknown   [name] 09/22/2017, 1:00 PM  _________________________________________________________________   (provider comments below)

## 2017-09-23 NOTE — Telephone Encounter (Signed)
Patient with diagnosis of Afib on Eliquis for anticoagulation. Pt has a history of TIA.     Procedure: colonoscopy Date of procedure: 11/18/17  CHADS2-VASc score of  6 (CHF, HTN, AGE, DM2, stroke/tia x 2, CAD, AGE, female)  CrCl 34ml/min  Given history of TIA would recommend hold only 24 hours prior to procedure and resume as soon as possible after procedure.

## 2017-09-23 NOTE — Telephone Encounter (Signed)
Documentation faxed to provided number to attn Dr. Therisa Doyne.

## 2017-10-08 ENCOUNTER — Ambulatory Visit (INDEPENDENT_AMBULATORY_CARE_PROVIDER_SITE_OTHER): Payer: Medicare Other

## 2017-10-08 DIAGNOSIS — Z950 Presence of cardiac pacemaker: Secondary | ICD-10-CM | POA: Diagnosis not present

## 2017-10-08 DIAGNOSIS — I5022 Chronic systolic (congestive) heart failure: Secondary | ICD-10-CM | POA: Diagnosis not present

## 2017-10-08 NOTE — Progress Notes (Signed)
EPIC Encounter for ICM Monitoring  Patient Name: [name] is a 72 y.o. female Date: 10/08/2017 Primary Care Physican: Carol Ada, MD Primary Cardiologist:Klein Electrophysiologist:Klein Dry Weight:unknown Bi-V Pacing:>99%    Since Jul 28, 2017 AT/AF Burden 14%       Heart Failure questions reviewed, pt asymptomatic.   Thoracic impedance normal.  Prescribed dosage: Furosemide20 mg take 1 tablet daily   Recommendations: No changes.   Encouraged to call for fluid symptoms.  Follow-up plan: ICM clinic phone appointment on 11/16/2017.  Office appointment scheduled 10/14/2017 with Chanetta Marshall, NP.  Copy of ICM check sent to Dr. [name].   3 month ICM trend: 10/08/2017    AT/AF    1 Year ICM trend:       Brenda Billings, RN 10/08/2017 1:22 PM

## 2017-10-12 NOTE — Progress Notes (Signed)
Electrophysiology Office Note Date: 10/14/2017  ID:  Jahniah, Pallas August 08, 1946, MRN [id number]  PCP: Carol Ada, MD Electrophysiologist: [name]  CC: CRT follow up  [name] is a 72 y.o. female seen today for Dr [name].  She presents today for routine electrophysiology followup. She underwent upgrade to [facility] 04/2017.  AV opt echo 10/28 with improvement in EF to 40-45%.  She denies chest pain, PND, orthopnea, nausea, vomiting, dizziness, syncope, edema, weight gain, or early satiety.  Device History: STJ dual chamber PPM implanted 1999 for CHB; gen change 2006; gen change 2015; upgrade to CRTP 2018   Past Medical History:  Diagnosis Date  . Arthritis    "all over" (04/20/2017)  . Atrial tachycardia ([place])   . CHF (congestive heart failure) ([place])   . CKD (chronic kidney disease), stage III ([place])   . Complete heart block (HCC)    a. s/p STJ dual chamber pacemaker 1999; upgrade to CRTP 2018  . Diverticulosis   . Glaucoma   . HLD (hyperlipidemia)   . HTN (hypertension)   . Iritis   . NICM (nonischemic cardiomyopathy) ([place])    echo 1/11: EF 35-40%, mod LVH, Grade 1 diast dysfxn, mild BAE;    cath 2/06: luminal irregs;  Myoview 10/2:  no scar or ischemia, EF 37%   . OSA (obstructive sleep apnea) 08/21/2016   "never RX'd mask" (04/20/2017)  . Paroxysmal atrial fibrillation (HCC)   . Sarcoidosis of lung ([place])   . Stroke [facility]) ~ 2016   "mini-stroke"; denies residual on 04/20/2017)  . Tuberculosis 1950s/1960s?   Past Surgical History:  Procedure Laterality Date  . BIV UPGRADE N/A 04/20/2017   Procedure: BiV Upgrade;  Surgeon: Deboraha Sprang, MD;  Location: [place] CV LAB;  Service: Cardiovascular;  Laterality: N/A;  . CARDIAC CATHETERIZATION    . EYE SURGERY    . GLAUCOMA SURGERY Right   . INSERT / REPLACE / [place]; 12/26/13   STJ dual chamber pacemaker implanted 1999; gen change 12/2013 by Dr [name] - STJ   . PERMANENT PACEMAKER GENERATOR CHANGE  N/A 01/02/2014   Procedure: PERMANENT PACEMAKER GENERATOR CHANGE;  Surgeon: Deboraha Sprang, MD;  Location: [facility] CATH LAB;  Service: Cardiovascular;  Laterality: N/A;  . TUBAL LIGATION    . VAGINAL HYSTERECTOMY      Current Outpatient Medications  Medication Sig Dispense Refill  . albuterol (PROAIR HFA) 108 (90 BASE) MCG/ACT inhaler Inhale 2 puffs into the lungs every 6 (six) hours as needed for wheezing. 1 Inhaler 3  . bimatoprost (LUMIGAN) 0.01 % SOLN Place 1 drop into the right eye at bedtime.     . carvedilol (COREG) 25 MG tablet TAKE 1 TABLET BY MOUTH TWICE DAILY WITH A MEAL 180 tablet 3  . clindamycin (CLEOCIN) 150 MG capsule Take 600 mg by mouth See admin instructions. Prior to dental procedures     . cycloSPORINE (RESTASIS) 0.05 % ophthalmic emulsion Place 1 drop into both eyes 2 (two) times daily.     Marland Kitchen ELIQUIS 5 MG TABS tablet TAKE 1 TABLET BY MOUTH TWICE DAILY 60 tablet 9  . ezetimibe (ZETIA) 10 MG tablet Take 10 mg by mouth daily at 2 PM.     . famotidine (PEPCID) 20 MG tablet TAKE 1 TABLET BY MOUTH TWICE DAILY (Patient taking differently: TAKE 1 TABLET BY MOUTH TWICE DAILY (in the morning & in the afternoon)) 60 tablet 6  . furosemide (LASIX) 20 MG tablet Take 20  mg by mouth daily.    . hydroxychloroquine (PLAQUENIL) 200 MG tablet Take 400 mg by mouth daily.     Marland Kitchen loteprednol (LOTEMAX) 0.5 % ophthalmic suspension Place 1 drop into both eyes 4 (four) times daily.     . Multiple Vitamins-Minerals (PRESERVISION/LUTEIN) CAPS Take 1 capsule by mouth 2 (two) times daily. In the morning & afternoon    . nitroGLYCERIN (NITROSTAT) 0.4 MG SL tablet Place 0.4 mg under the tongue every 5 (five) minutes as needed for chest pain. For chest pain     No current facility-administered medications for this visit.     Allergies:   Morphine and related; Codeine; and Penicillins   Social History: Social History   Socioeconomic History  . Marital status: Legally Separated    Spouse name: Not on  file  . Number of children: 5  . Years of education: Not on file  . Highest education level: Not on file  Social Needs  . Financial resource strain: Not on file  . Food insecurity - worry: Not on file  . Food insecurity - inability: Not on file  . Transportation needs - medical: Not on file  . Transportation needs - non-medical: Not on file  Occupational History  . Occupation: disabled  Tobacco Use  . Smoking status: Former Smoker    Packs/day: 0.50    Years: 8.00    Pack years: 4.00    Types: Cigarettes    Last attempt to quit: 10/06/1988    Years since quitting: 29.0  . Smokeless tobacco: Never Used  Substance and Sexual Activity  . Alcohol use: No  . Drug use: No  . Sexual activity: No  Other Topics Concern  . Not on file  Social History Narrative   Retired.     Family History: Family History  Problem Relation Age of Onset  . Heart disease Father   . Asthma Father   . Rheum arthritis Mother   . Diabetes Sister   . Colon cancer Brother   . Colon cancer Brother   . Coronary artery disease Sister   . Coronary artery disease Sister      Review of Systems: All other systems reviewed and are otherwise negative except as noted above.   Physical Exam: VS:  BP 108/70   Pulse 63   Ht 5\' 3"  (1.6 m)   Wt 196 lb (88.9 kg)   SpO2 97%   BMI 34.72 kg/m  , BMI Body mass index is 34.72 kg/m.  GEN- The patient is well appearing, alert and oriented x 3 today.   HEENT: normocephalic, atraumatic; sclera clear, conjunctiva pink; hearing intact; oropharynx clear; neck supple  Lungs- Clear to ausculation bilaterally, normal work of breathing.  No wheezes, rales, rhonchi Heart- Regular rate and rhythm(paced) GI- soft, non-tender, non-distended, bowel sounds present  Extremities- no clubbing, cyanosis, or edema  MS- no significant deformity or atrophy Skin- warm and dry, no rash or lesion; PPM pocket well healed Psych- euthymic mood, full affect Neuro- strength and sensation  are intact  PPM Interrogation- reviewed in detail today,  See PACEART report  EKG:  EKG is not ordered today.  Recent Labs: 02/18/2017: Magnesium 2.3; TSH 2.540 04/13/2017: BUN 23; Creatinine, Ser 2.17; Hemoglobin 10.2; Platelets 225; Potassium 4.8; Sodium 143   Wt Readings from Last 3 Encounters:  10/14/17 196 lb (88.9 kg)  07/28/17 197 lb 9.6 oz (89.6 kg)  06/04/17 198 lb (89.8 kg)     Other studies Reviewed: Additional studies/ records  that were reviewed today include: Dr Olin Pia office notes, hospital records   Assessment and Plan:  1.  Complete heart block Normal PPM function See Pace Art report No changes today  2.  Chronic systolic heart failuret Euvolemic on exam Continue current therapy Continue follow up in ICM clinic EF improved post CRT  3.  CKD, stage IV Followed by nephrology  4.  HTN Stable No change required today  5.  Symptomatic paroxysmal atrial fibrillation Burden by device interrogation 12% V rates controlled CHADS2VASC is at least 4, continue Eliquis   Current medicines are reviewed at length with the patient today.   The patient does not have concerns regarding her medicines.  The following changes were made today:  none  Labs/ tests ordered today include: none No orders of the defined types were placed in this encounter.    Disposition:   Follow up with Delilah Shan, Dr [name] 6 months    Signed, Chanetta Marshall, NP 10/14/2017 6:39 PM  [place] [address] [phone] (office) [phone] (fax)

## 2017-10-14 ENCOUNTER — Encounter: Payer: Self-pay | Admitting: Nurse Practitioner

## 2017-10-14 ENCOUNTER — Ambulatory Visit (INDEPENDENT_AMBULATORY_CARE_PROVIDER_SITE_OTHER): Payer: Medicare Other | Admitting: Nurse Practitioner

## 2017-10-14 VITALS — BP 108/70 | HR 63 | Ht 63.0 in | Wt 196.0 lb

## 2017-10-14 DIAGNOSIS — I442 Atrioventricular block, complete: Secondary | ICD-10-CM

## 2017-10-14 DIAGNOSIS — I5022 Chronic systolic (congestive) heart failure: Secondary | ICD-10-CM | POA: Diagnosis not present

## 2017-10-14 DIAGNOSIS — I480 Paroxysmal atrial fibrillation: Secondary | ICD-10-CM | POA: Diagnosis not present

## 2017-10-14 DIAGNOSIS — N184 Chronic kidney disease, stage 4 (severe): Secondary | ICD-10-CM | POA: Diagnosis not present

## 2017-10-14 NOTE — Patient Instructions (Addendum)
Medication Instructions:   Your physician recommends that you continue on your current medications as directed. Please refer to the Current Medication list given to you today.   If you need a refill on your cardiac medications before your next appointment, please call your pharmacy.  Labwork:  NONE ORDERED  TODAY     Testing/Procedures: NONE ORDERED  TODAY   Follow-Up: Your physician wants you to follow-up in: [place] will receive a reminder letter in the mail two months in advance. If you don't receive a letter, please call our office to schedule the follow-up appointment.   Remote monitoring is used to monitor your Pacemaker of ICD from home. This monitoring reduces the number of office visits required to check your device to one time per year. It allows [place] to keep an eye on the functioning of your device to ensure it is working properly. You are scheduled for a device check from home on .11-16-17  You may send your transmission at any time that day. If you have a wireless device, the transmission will be sent automatically. After your physician reviews your transmission, you will receive a postcard with your next transmission date.     Any Other Special Instructions Will Be Listed Below (If Applicable).

## 2017-10-19 ENCOUNTER — Encounter: Payer: Self-pay | Admitting: Pulmonary Disease

## 2017-10-19 ENCOUNTER — Ambulatory Visit (INDEPENDENT_AMBULATORY_CARE_PROVIDER_SITE_OTHER): Payer: Medicare Other | Admitting: Pulmonary Disease

## 2017-10-19 VITALS — BP 126/74 | HR 60 | Ht 63.0 in | Wt 194.0 lb

## 2017-10-19 DIAGNOSIS — G4733 Obstructive sleep apnea (adult) (pediatric): Secondary | ICD-10-CM

## 2017-10-19 DIAGNOSIS — Z9989 Dependence on other enabling machines and devices: Secondary | ICD-10-CM

## 2017-10-19 NOTE — Progress Notes (Signed)
[facility]  Chief Complaint  Patient presents with  . Sleep Apnea    Having issues with her mask she would like to discuss. DME is [facility].    Vital signs: BP 126/74 (BP Location: Left Arm, Cuff Size: Normal)   Pulse 60   Ht 5\' 3"  (1.6 m)   Wt 194 lb (88 kg)   SpO2 100%   BMI 34.37 kg/m   History of Present Illness: [name] is a 72 y.o. female former smoker upper airway cough syndrome, sarcoidosis with cardiac involvement, and obstructive sleep apnea.  Since her last visit she got a new CPAP machine.  This is working well.  She is sleeping well and feels rested.  She has trouble with irritation from the lining of her mask.  Not having cough, sinus congestion, post nasal drip, chest pain, sputum, swelling, or skin rash.  Physical Exam:  General - pleasant Eyes - pupils reactive, wears glasses ENT - no sinus tenderness, no oral exudate, no LAN, MP 4, scalloped tongue Cardiac - regular, no murmur Chest - no wheeze, rales Abd - soft, non tender Ext - no edema Skin - no rashes Neuro - normal strength Psych - normal mood  Assessment/Plan:  Obstructive sleep apnea. - she is compliant with CPAP and reports benefit - continue auto CPAP  - will have her DME refit her mask since she is having irritation from mask lining  Upper airway cough syndrome. - prn OTC antihistamine  Systemic sarcoidosis with cardiac involvement. - plaquenil per Dr. Trudie Reed with rheumatology - f/u with cardiology   Patient Instructions  Will arrange for refitting of your CPAP mask  Follow up in 1 year    [name], MD [place] 10/19/2017, 1:41 PM Pager:  [phone]  Flow Sheet  Cardiac tests: Echo 03/04/17 >> EF 25 to 30%  Pulmonary tests Lung biopsy 1984 >> consistent with sarcoidosis PFT 02/13/12>>FEV1 1.03 (49%), FEV1% 76, TLC 3.30 (71%), DLCO 49%, positive BD response.  05/13/13PPD negative  07/24/13Start  MTX >>stopped November 2013 due to pruritis PFT 08/10/12>>FEV1 1.08 (52%), FEV1% 70, TLC 2.77 (59%), DLCO 44%, no BD PFT 04/21/13>>FEV1 1.05 (55%), FEV1% 75, TLC 2.70 (55%), DLCO 63%, no BD   CT chests CT chest 01/21/12>>b/l hilar and mediastinal nodes, diffuse peribronchovascular and perifissural nodularity with mild associated septal thickening. CT chest 08/06/12>>mediastinal/hilar LAN, diffuse peribronchovascular and perfissural nodularity  Sleep tests PSG 06/19/14 >> AHI 9.1, SpO2 low 74% HST 08/20/16 >> AHI 13.6, SaO2 low 75% HST 05/29/17 >> AHI 16.8, SaO2 low 75% Auto CPAP 09/19/17 to 10/18/17 >> used on 30 of 30 nights with average 6 hrs 19 min.  Average AHI 1.4 with median CPAP 10 and 95 th percentile CPAP 13 cm H2O  Past Medical History: She  has a past medical history of Arthritis, Atrial tachycardia ([place]), CHF (congestive heart failure) ([place]), CKD (chronic kidney disease), stage III ([place]), Complete heart block ([place]), Diverticulosis, Glaucoma, HLD (hyperlipidemia), HTN (hypertension), Iritis, NICM (nonischemic cardiomyopathy) ([place]), OSA (obstructive sleep apnea) (08/21/2016), Paroxysmal atrial fibrillation ([place]), Sarcoidosis of lung ([place]), Stroke ([place]) (~ 2016), and Tuberculosis (1950s/1960s?).  Past Surgical History: She  has a past surgical history that includes Tubal ligation; Permanent pacemaker generator change (N/A, 01/02/2014); Insert / replace / remove pacemaker (1999; 12/26/13); Glaucoma surgery (Right); Eye surgery; Vaginal hysterectomy; Cardiac catheterization; and BIV UPGRADE (N/A, 04/20/2017).  Family History: Her family history includes Asthma in her father; Colon cancer in her brother and brother;  Coronary artery disease in her sister and sister; Diabetes in her sister; Heart disease in her father; Rheum arthritis in her mother.  Social History: She  reports that she quit smoking about 29 years ago. Her smoking use included cigarettes. She has a 4.00 pack-year smoking  history. she has never used smokeless tobacco. She reports that she does not drink alcohol or use drugs.  Medications: Allergies as of 10/19/2017      Reactions   Morphine And Related Anaphylaxis   Throat closing   Codeine Itching   Penicillins Itching   Has patient had a PCN reaction causing immediate rash, facial/tongue/throat swelling, SOB or lightheadedness with hypotension: No Has patient had a PCN reaction causing severe rash involving mucus membranes or skin necrosis: No Has patient had a PCN reaction that required hospitalization: No Has patient had a PCN reaction occurring within the last 10 years: No If all of the above answers are "NO", then may proceed with Cephalosporin use.      Medication List        Accurate as of 10/19/17  1:41 PM. Always use your most recent med list.          albuterol 108 (90 Base) MCG/ACT inhaler Commonly known as:  PROAIR HFA Inhale 2 puffs into the lungs every 6 (six) hours as needed for wheezing.   carvedilol 25 MG tablet Commonly known as:  COREG TAKE 1 TABLET BY MOUTH TWICE DAILY WITH A MEAL   clindamycin 150 MG capsule Commonly known as:  CLEOCIN Take 600 mg by mouth See admin instructions. Prior to dental procedures   cycloSPORINE 0.05 % ophthalmic emulsion Commonly known as:  RESTASIS Place 1 drop into both eyes 2 (two) times daily.   ELIQUIS 5 MG Tabs tablet Generic drug:  apixaban TAKE 1 TABLET BY MOUTH TWICE DAILY   famotidine 20 MG tablet Commonly known as:  PEPCID TAKE 1 TABLET BY MOUTH TWICE DAILY   furosemide 20 MG tablet Commonly known as:  LASIX Take 20 mg by mouth daily.   hydroxychloroquine 200 MG tablet Commonly known as:  PLAQUENIL Take 400 mg by mouth daily.   LOTEMAX 0.5 % ophthalmic suspension Generic drug:  loteprednol Place 1 drop into both eyes 4 (four) times daily.   LUMIGAN 0.01 % Soln Generic drug:  bimatoprost Place 1 drop into the right eye at bedtime.   nitroGLYCERIN 0.4 MG SL  tablet Commonly known as:  NITROSTAT Place 0.4 mg under the tongue every 5 (five) minutes as needed for chest pain. For chest pain   PRESERVISION/LUTEIN Caps Take 1 capsule by mouth 2 (two) times daily. In the morning & afternoon   ZETIA 10 MG tablet Generic drug:  ezetimibe Take 10 mg by mouth daily at 2 PM.

## 2017-10-19 NOTE — Patient Instructions (Signed)
Will arrange for refitting of your CPAP mask ? ?Follow up in 1 year ?

## 2017-10-28 LAB — CUP PACEART INCLINIC DEVICE CHECK
Implantable Lead Implant Date: 19990414
Implantable Lead Location: 753859
Implantable Lead Location: 753860
MDC IDC LEAD IMPLANT DT: 19990414
MDC IDC LEAD IMPLANT DT: 20180716
MDC IDC LEAD LOCATION: 753858
MDC IDC PG IMPLANT DT: 20150330
MDC IDC SESS DTM: 20190123084003
Pulse Gen Model: 3242
Pulse Gen Serial Number: 7610861

## 2017-11-04 ENCOUNTER — Ambulatory Visit (HOSPITAL_COMMUNITY): Payer: Medicare Other | Attending: Internal Medicine

## 2017-11-04 ENCOUNTER — Other Ambulatory Visit: Payer: Self-pay

## 2017-11-04 DIAGNOSIS — E785 Hyperlipidemia, unspecified: Secondary | ICD-10-CM | POA: Diagnosis not present

## 2017-11-04 DIAGNOSIS — I071 Rheumatic tricuspid insufficiency: Secondary | ICD-10-CM | POA: Diagnosis not present

## 2017-11-04 DIAGNOSIS — G4733 Obstructive sleep apnea (adult) (pediatric): Secondary | ICD-10-CM | POA: Diagnosis not present

## 2017-11-04 DIAGNOSIS — I442 Atrioventricular block, complete: Secondary | ICD-10-CM | POA: Insufficient documentation

## 2017-11-04 DIAGNOSIS — Z8673 Personal history of transient ischemic attack (TIA), and cerebral infarction without residual deficits: Secondary | ICD-10-CM | POA: Diagnosis not present

## 2017-11-16 ENCOUNTER — Ambulatory Visit (INDEPENDENT_AMBULATORY_CARE_PROVIDER_SITE_OTHER): Payer: Medicare Other

## 2017-11-16 DIAGNOSIS — I5022 Chronic systolic (congestive) heart failure: Secondary | ICD-10-CM

## 2017-11-16 DIAGNOSIS — Z950 Presence of cardiac pacemaker: Secondary | ICD-10-CM | POA: Diagnosis not present

## 2017-11-17 HISTORY — PX: OTHER SURGICAL HISTORY: SHX169

## 2017-11-17 NOTE — Progress Notes (Signed)
EPIC Encounter for ICM Monitoring  Patient Name: [name] is a 72 y.o. female Date: 11/17/2017 Primary Care Physican: Carol Ada, MD Primary Cardiologist:Klein Electrophysiologist:Klein Dry Weight:195 lbs Bi-V Pacing:>99%      Heart Failure questions reviewed, pt asymptomatic.  She had several pounds weight gain during decreased impedance.    Thoracic impedance normal.  Prescribed dosage: Furosemide20 mg take 1 tablet daily  Recommendations: No changes.  Encouraged to call for fluid symptoms.  Follow-up plan: ICM clinic phone appointment on 12/17/2017.    Copy of ICM check sent to Dr. [name].   3 month ICM trend: 11/16/2017    1 Year ICM trend:       [name], RN 11/17/2017 11:38 AM

## 2017-12-17 ENCOUNTER — Ambulatory Visit (INDEPENDENT_AMBULATORY_CARE_PROVIDER_SITE_OTHER): Payer: Medicare Other

## 2017-12-17 DIAGNOSIS — Z950 Presence of cardiac pacemaker: Secondary | ICD-10-CM | POA: Diagnosis not present

## 2017-12-17 DIAGNOSIS — I5022 Chronic systolic (congestive) heart failure: Secondary | ICD-10-CM

## 2017-12-17 NOTE — Progress Notes (Signed)
EPIC Encounter for ICM Monitoring  Patient Name: [name] is a 72 y.o. female Date: 12/17/2017 Primary Care Physican: Carol Ada, MD Primary Cardiologist:Klein Electrophysiologist:Klein Dry Weight:195 lbs Bi-V Pacing:>99%       Heart Failure questions reviewed, pt had weight gain during decreased impedance for a couple days.  She tries to cook more at home and has stopped eating canned soups due to high sodium   Thoracic impedance normal but was abnormal suggesting fluid accumulation from 11/23/2017 to 11/29/2017 and 12/05/2017 to 12/11/2017.  Prescribed dosage: Furosemide20 mg take 1 tablet daily  Recommendations: No changes.  Encouraged to call for fluid symptoms.  Follow-up plan: ICM clinic phone appointment on 01/18/2018.    Copy of ICM check sent to Dr. [name]  3 month ICM trend: 12/17/2017    1 Year ICM trend:       [name], RN 12/17/2017 2:33 PM

## 2018-01-18 ENCOUNTER — Ambulatory Visit (INDEPENDENT_AMBULATORY_CARE_PROVIDER_SITE_OTHER): Payer: Medicare Other

## 2018-01-18 DIAGNOSIS — I5022 Chronic systolic (congestive) heart failure: Secondary | ICD-10-CM | POA: Diagnosis not present

## 2018-01-18 DIAGNOSIS — Z950 Presence of cardiac pacemaker: Secondary | ICD-10-CM | POA: Diagnosis not present

## 2018-01-19 ENCOUNTER — Telehealth: Payer: Self-pay

## 2018-01-19 NOTE — Telephone Encounter (Signed)
Remote ICM transmission received.  Attempted call to patient and left message to return call. 

## 2018-01-19 NOTE — Progress Notes (Signed)
EPIC Encounter for ICM Monitoring  Patient Name: [name] is a 72 y.o. female Date: 01/19/2018 Primary Care Physican: Carol Ada, MD Primary Cardiologist:Klein Electrophysiologist:Klein Dry Weight:previous weight 195 lbs Bi-V Pacing:>99%    AT/AF Burden 1.6%      Attempted call to patient and unable to reach.  Left message to return call.  Transmission reviewed.    Thoracic impedance normal.  Prescribed dosage: Furosemide20 mg take 1 tablet daily  Labs: 04/13/2017 Creatinine 2.17, BUN 23, Potassium 4.8, Sodium 143  Recommendations: NONE - Unable to reach.  Follow-up plan: ICM clinic phone appointment on 02/22/2018.  Recall appointment with Dr [name] 10/2018  Copy of ICM check sent to Dr. [name].   3 month ICM trend: 01/19/2018    1 Year ICM trend:       [name], RN 01/19/2018 12:11 PM

## 2018-01-21 NOTE — Progress Notes (Signed)
Returned patient call as requested by voice mail message.  She stated she is doing really well.  Reviewed transmission.  No fluid symptoms and weight stable at 193 lbs. Next ICM remote transmission 02/22/2018. Encouraged to call for fluid symptoms.

## 2018-02-19 ENCOUNTER — Other Ambulatory Visit: Payer: Self-pay | Admitting: Internal Medicine

## 2018-02-22 ENCOUNTER — Ambulatory Visit (INDEPENDENT_AMBULATORY_CARE_PROVIDER_SITE_OTHER): Payer: Medicare Other | Admitting: *Deleted

## 2018-02-22 DIAGNOSIS — I5022 Chronic systolic (congestive) heart failure: Secondary | ICD-10-CM | POA: Diagnosis not present

## 2018-02-22 DIAGNOSIS — I442 Atrioventricular block, complete: Secondary | ICD-10-CM

## 2018-02-22 DIAGNOSIS — Z950 Presence of cardiac pacemaker: Secondary | ICD-10-CM

## 2018-02-22 NOTE — Progress Notes (Addendum)
Remote ICD transmission.   

## 2018-02-23 NOTE — Progress Notes (Signed)
EPIC Encounter for ICM Monitoring  Patient Name: [name] is a 72 y.o. female Date: 02/23/2018 Primary Care Physican: Carol Ada, MD Primary Cardiologist:Klein Electrophysiologist:Klein Dry Weight:195 lbs Bi-V Pacing:>99%       Heart Failure questions reviewed, pt asymptomatic.  She reported she was sick during time of decreased impedance   Thoracic impedance normal.  Prescribed dosage: Furosemide20 mg take 1 tablet daily  Labs: 04/13/2017 Creatinine 2.17, BUN 23, Potassium 4.8, Sodium 143  Recommendations: No changes.  Reinforced sodium restriction.  Encouraged to call for fluid symptoms.  Follow-up plan: ICM clinic phone appointment on 03/25/2018.  Copy of ICM check sent to Dr. [name].   3 month ICM trend: 02/22/2018    1 Year ICM trend:       [name], RN 02/23/2018 2:53 PM

## 2018-03-16 ENCOUNTER — Emergency Department (HOSPITAL_COMMUNITY): Payer: Medicare Other

## 2018-03-16 ENCOUNTER — Encounter (HOSPITAL_COMMUNITY): Payer: Self-pay

## 2018-03-16 ENCOUNTER — Other Ambulatory Visit: Payer: Self-pay

## 2018-03-16 ENCOUNTER — Emergency Department (HOSPITAL_COMMUNITY)
Admission: EM | Admit: 2018-03-16 | Discharge: 2018-03-17 | Disposition: A | Discharge status: Home / Self Care | Payer: Medicare Other | Attending: Emergency Medicine | Admitting: Emergency Medicine

## 2018-03-16 DIAGNOSIS — I130 Hypertensive heart and chronic kidney disease with heart failure and stage 1 through stage 4 chronic kidney disease, or unspecified chronic kidney disease: Secondary | ICD-10-CM | POA: Insufficient documentation

## 2018-03-16 DIAGNOSIS — R51 Headache: Secondary | ICD-10-CM | POA: Diagnosis present

## 2018-03-16 DIAGNOSIS — Z7901 Long term (current) use of anticoagulants: Secondary | ICD-10-CM | POA: Diagnosis not present

## 2018-03-16 DIAGNOSIS — Z950 Presence of cardiac pacemaker: Secondary | ICD-10-CM | POA: Insufficient documentation

## 2018-03-16 DIAGNOSIS — N183 Chronic kidney disease, stage 3 (moderate): Secondary | ICD-10-CM | POA: Insufficient documentation

## 2018-03-16 DIAGNOSIS — R202 Paresthesia of skin: Secondary | ICD-10-CM | POA: Diagnosis not present

## 2018-03-16 DIAGNOSIS — G43109 Migraine with aura, not intractable, without status migrainosus: Secondary | ICD-10-CM

## 2018-03-16 DIAGNOSIS — R519 Headache, unspecified: Secondary | ICD-10-CM

## 2018-03-16 DIAGNOSIS — I5020 Unspecified systolic (congestive) heart failure: Secondary | ICD-10-CM | POA: Insufficient documentation

## 2018-03-16 DIAGNOSIS — Z87891 Personal history of nicotine dependence: Secondary | ICD-10-CM | POA: Diagnosis not present

## 2018-03-16 DIAGNOSIS — R200 Anesthesia of skin: Secondary | ICD-10-CM

## 2018-03-16 LAB — CBC WITH DIFFERENTIAL/PLATELET
Abs Immature Granulocytes: 0 10*3/uL (ref 0.0–0.1)
BASOS ABS: 0 10*3/uL (ref 0.0–0.1)
BASOS PCT: 1 %
EOS ABS: 0.2 10*3/uL (ref 0.0–0.7)
Eosinophils Relative: 5 %
HCT: 34 % — ABNORMAL LOW (ref 36.0–46.0)
Hemoglobin: 10.7 g/dL — ABNORMAL LOW (ref 12.0–15.0)
IMMATURE GRANULOCYTES: 0 %
Lymphocytes Relative: 20 %
Lymphs Abs: 0.7 10*3/uL (ref 0.7–4.0)
MCH: 29.5 pg (ref 26.0–34.0)
MCHC: 31.5 g/dL (ref 30.0–36.0)
MCV: 93.7 fL (ref 78.0–100.0)
MONO ABS: 0.4 10*3/uL (ref 0.1–1.0)
Monocytes Relative: 10 %
NEUTROS ABS: 2.2 10*3/uL (ref 1.7–7.7)
NEUTROS PCT: 64 %
PLATELETS: 170 10*3/uL (ref 150–400)
RBC: 3.63 MIL/uL — ABNORMAL LOW (ref 3.87–5.11)
RDW: 12.3 % (ref 11.5–15.5)
WBC: 3.6 10*3/uL — ABNORMAL LOW (ref 4.0–10.5)

## 2018-03-16 LAB — I-STAT TROPONIN, ED: Troponin i, poc: 0.04 ng/mL (ref 0.00–0.08)

## 2018-03-16 LAB — APTT: aPTT: 35 seconds (ref 24–36)

## 2018-03-16 LAB — COMPREHENSIVE METABOLIC PANEL
ALT: 15 U/L (ref 14–54)
ANION GAP: 8 (ref 5–15)
AST: 28 U/L (ref 15–41)
Albumin: 3.5 g/dL (ref 3.5–5.0)
Alkaline Phosphatase: 66 U/L (ref 38–126)
BILIRUBIN TOTAL: 0.6 mg/dL (ref 0.3–1.2)
BUN: 34 mg/dL — ABNORMAL HIGH (ref 6–20)
CALCIUM: 9.7 mg/dL (ref 8.9–10.3)
CO2: 25 mmol/L (ref 22–32)
CREATININE: 2.37 mg/dL — AB (ref 0.44–1.00)
Chloride: 108 mmol/L (ref 101–111)
GFR calc non Af Amer: 20 mL/min — ABNORMAL LOW (ref >60)
GFR, EST AFRICAN AMERICAN: 23 mL/min — AB (ref >60)
Glucose, Bld: 74 mg/dL (ref 65–99)
Potassium: 4.5 mmol/L (ref 3.5–5.1)
Sodium: 141 mmol/L (ref 135–145)
TOTAL PROTEIN: 6.6 g/dL (ref 6.5–8.1)

## 2018-03-16 LAB — PROTIME-INR
INR: 1.63
Prothrombin Time: 19.2 seconds — ABNORMAL HIGH (ref 11.4–15.2)

## 2018-03-16 LAB — ETHANOL

## 2018-03-16 MED ORDER — PROCHLORPERAZINE EDISYLATE 10 MG/2ML IJ SOLN
10.0000 mg | Freq: Once | INTRAMUSCULAR | Status: AC
  Administered 2018-03-17: 10 mg via INTRAVENOUS
  Filled 2018-03-16: qty 2

## 2018-03-16 MED ORDER — METOCLOPRAMIDE HCL 5 MG/ML IJ SOLN
10.0000 mg | Freq: Once | INTRAMUSCULAR | Status: AC
  Administered 2018-03-16: 10 mg via INTRAVENOUS
  Filled 2018-03-16: qty 2

## 2018-03-16 MED ORDER — VALPROATE SODIUM 500 MG/5ML IV SOLN
500.0000 mg | Freq: Once | INTRAVENOUS | Status: AC
  Administered 2018-03-16: 500 mg via INTRAVENOUS
  Filled 2018-03-16: qty 5

## 2018-03-16 MED ORDER — MAGNESIUM SULFATE 2 GM/50ML IV SOLN
2.0000 g | Freq: Once | INTRAVENOUS | Status: AC
Start: 2018-03-17 — End: 2018-03-17
  Administered 2018-03-17: 2 g via INTRAVENOUS
  Filled 2018-03-16: qty 50

## 2018-03-16 MED ORDER — KETOROLAC TROMETHAMINE 30 MG/ML IJ SOLN: 30.0000 mg | Freq: Once | INTRAMUSCULAR | Status: DC

## 2018-03-16 NOTE — ED Provider Notes (Signed)
Patient placed in Quick Look pathway, seen and evaluated   Chief Complaint: Left sided numbness   HPI:   Patient reports that a few minutes ago she started feeling like she was numb on her entire left side.  She reports feeling like like her face, arms and legs are all numb.  Headache since last night.    ROS No fevers  Physical Exam:   Gen: No distress  Neuro: Awake and Alert  Skin: Warm    Focused Exam: Is able to feel sensation on bilateral arms and legs, reports it feels different on the left side of her face  and left anterior lower leg.  Normal finger to nose bilaterally.  Normal heal shin.    Mental Status:  Alert, oriented, thought content appropriate, able to give a coherent history. Speech fluent without evidence of aphasia. Able to follow 2 step commands without difficulty.  Cranial Nerves:  II:  Peripheral visual fields grossly normal, pupils equal, round, reactive to light III,IV, VI: ptosis not present, extra-ocular motions intact bilaterally  V,VII: smile symmetric, facial light touch sensation equal VIII: hearing grossly normal to voice  X: uvula elevates symmetrically  XI: bilateral shoulder shrug symmetric and strong XII: midline tongue extension without fassiculations Motor:  Normal tone. 5/5 in upper and lower extremities bilaterally including strong and equal grip strength and dorsiflexion/plantar flexion Cerebellar: normal finger-to-nose with bilateral upper extremities Gait: normal gait and balance CV: distal pulses palpable throughout     Initiation of care has begun. The patient has been counseled on the process, plan, and necessity for staying for the completion/evaluation, and the remainder of the medical screening examination.     Lorin Glass, PA-C 03/16/18 1831    Malvin Johns, MD 03/16/18 2038

## 2018-03-16 NOTE — Consult Note (Signed)
Neurology Consultation Reason for Consult: Left-sided numbness Referring Physician: Long, J  CC: Left-sided numbness  History is obtained from: Patient  HPI: [name] is a 72 y.o. female with a history of CKD, CHF, atrial tachycardia who presents with left-sided tingling started around 5:30 PM tonight.  It initiated in her left foot and then gradually over the course of about 1/2-hour migrated up her entire left side to involve her face.  This is been persistent and since that time she is started to have a left unilateral headache.  She denies photophobia.  She does have a history of migraine headaches this started when she was young  But has not gotten one in many years.  When she would get them, she would get visual aura, but has not had paresthesias with them in the past.  She denies nausea, visual change, difficulty walking, other symptoms.   LKW: 5:30 PM tpa given?: no, mild symptoms  ROS: A 14 point ROS was performed and is negative except as noted in the HPI.   Past Medical History:  Diagnosis Date  . Arthritis    "all over" (04/20/2017)  . Atrial tachycardia ([place])   . CHF (congestive heart failure) ([place])   . CKD (chronic kidney disease), stage III ([place])   . Complete heart block (HCC)    a. s/p STJ dual chamber pacemaker 1999; upgrade to CRTP 2018  . Diverticulosis   . Glaucoma   . HLD (hyperlipidemia)   . HTN (hypertension)   . Iritis   . NICM (nonischemic cardiomyopathy) ([place])    echo 1/11: EF 35-40%, mod LVH, Grade 1 diast dysfxn, mild BAE;    cath 2/06: luminal irregs;  Myoview 10/2:  no scar or ischemia, EF 37%   . OSA (obstructive sleep apnea) 08/21/2016   "never RX'd mask" (04/20/2017)  . Paroxysmal atrial fibrillation (HCC)   . Sarcoidosis of lung ([place])   . Stroke [facility]) ~ 2016   "mini-stroke"; denies residual on 04/20/2017)  . Tuberculosis 1950s/1960s?     Family History  Problem Relation Age of Onset  . Heart disease Father   . Asthma Father   .  Rheum arthritis Mother   . Diabetes Sister   . Colon cancer Brother   . Colon cancer Brother   . Coronary artery disease Sister   . Coronary artery disease Sister      Social History:  reports that she quit smoking about 29 years ago. Her smoking use included cigarettes. She has a 4.00 pack-year smoking history. She has never used smokeless tobacco. She reports that she does not drink alcohol or use drugs.   Exam: Current vital signs: BP 133/73 (BP Location: Right Arm)   Pulse (!) 58   Temp 98.5 F (36.9 C) (Oral)   Resp 16   Ht 5\' 3"  (1.6 m)   Wt 88 kg (194 lb)   SpO2 100%   BMI 34.37 kg/m  Vital signs in last 24 hours: Temp:  [98.5 F (36.9 C)] 98.5 F (36.9 C) (06/11 1825) Pulse Rate:  [58] 58 (06/11 1825) Resp:  [16] 16 (06/11 1825) BP: (133)/(73) 133/73 (06/11 1825) SpO2:  [100 %] 100 % (06/11 1825) Weight:  [88 kg (194 lb)] 88 kg (194 lb) (06/11 1825)   Physical Exam  Constitutional: Appears well-developed and well-nourished.  Psych: Affect appropriate to situation Eyes: No scleral injection HENT: No OP obstrucion Head: Normocephalic.  Cardiovascular: Normal rate and regular rhythm.  Respiratory: Effort normal, non-labored breathing GI:  Soft.  No distension. There is no tenderness.  Skin: WDI  Neuro: Mental Status: Patient is awake, alert, oriented to person, place, month, year, and situation. Patient is able to give a clear and coherent history. No signs of aphasia or neglect Cranial Nerves: II: Visual Fields are full. Pupils are equal, round, and reactive to light.   III,IV, VI: EOMI without ptosis or diploplia.  V: Facial sensation is symmetric to temperature VII: Facial movement is symmetric.  VIII: hearing is intact to voice X: Uvula elevates symmetrically XI: Shoulder shrug is symmetric. XII: tongue is midline without atrophy or fasciculations.  Motor: Tone is normal. Bulk is normal. 5/5 strength was present in bilateral arms, she has mild 4+/5  strength of the left leg, intact in the right. Sensory: She endorses paresthesia when checking light touch sensation on her left leg and face, decreased pinprick in the left face and leg. Cerebellar: FNF and HKS are intact bilaterally  I have reviewed labs in epic and the results pertinent to this consultation are: Normal glucose Elevated creatinine at 2.3  I have reviewed the images obtained: CT head-unremarkable intracranially  Impression: 72 year old female with a history of migraines who presents with migrating paresthesia followed by unilateral headache.  The character of the migrating paresthesia does make me think that migraine aura is actually more likely than ischemic stroke, but thalamic stroke can have paresthesias associated with it.  If she were to respond to migraine cocktail with resolution of symptoms, then I think I would be comfortable calling this complicated migraine, however if she continues to have paresthesias after treatment then I would admit her for a stroke work-up.  Recommendations: 1) Reglan/Depakote for migraine 2) if her symptoms resolve, then no further recommendations, if she continues to have symptoms, then I would favor admission for stroke work-up.   Roland Rack, MD [facility] [phone]  If 7pm- 7am, please page neurology on call as listed in [place].

## 2018-03-16 NOTE — ED Notes (Signed)
Pt to restroom

## 2018-03-16 NOTE — ED Provider Notes (Signed)
Emergency Department Provider Note   I have reviewed the triage vital signs and the nursing notes.   HISTORY  Chief Complaint Numbness   HPI [name] is a 72 y.o. female with PMH of CHF, CKD, HLD, HTN, PAF presents to the emergency department for evaluation of acute onset left-sided numbness.  Symptoms began at 5:30 PM acutely.  The patient denies any associated weakness.  No vision changes.  No difficulty walking or speaking.  She has no history of migraine headaches and has been compliant with her Eliquis.  She presented to the emergency department where she began experiencing a unilateral headache.  Denies any photophobia or phonophobia.  No fevers or chills. The patient's numbness began in the left foot and migrated upwards to the arm and face. She reports slight improvement since arrival.   Past Medical History:  Diagnosis Date  . Arthritis    "all over" (04/20/2017)  . Atrial tachycardia ([place])   . CHF (congestive heart failure) ([place])   . CKD (chronic kidney disease), stage III ([place])   . Complete heart block (HCC)    a. s/p STJ dual chamber pacemaker 1999; upgrade to CRTP 2018  . Diverticulosis   . Glaucoma   . HLD (hyperlipidemia)   . HTN (hypertension)   . Iritis   . NICM (nonischemic cardiomyopathy) ([place])    echo 1/11: EF 35-40%, mod LVH, Grade 1 diast dysfxn, mild BAE;    cath 2/06: luminal irregs;  Myoview 10/2:  no scar or ischemia, EF 37%   . OSA (obstructive sleep apnea) 08/21/2016   "never RX'd mask" (04/20/2017)  . Paroxysmal atrial fibrillation (HCC)   . Sarcoidosis of lung ([place])   . Stroke [facility]) ~ 2016   "mini-stroke"; denies residual on 04/20/2017)  . Tuberculosis 1950s/1960s?    Patient Active Problem List   Diagnosis Date Noted  . Congenital heart failure ([place]) 04/20/2017  . OSA (obstructive sleep apnea) 08/21/2016  . Sarcoidosis 03/22/2012  . Dyspnea 02/13/2012  . Chest pain 06/20/2011  . CKD (chronic kidney disease), stage III ([place])  06/20/2011  . St. Jude pacemaker in situ 06/20/2011  . Atrial tachycardia ([place]) 06/20/2011  . AV BLOCK, COMPLETE 10/09/2009  . SYSTOLIC HEART FAILURE, CHRONIC 09/14/2009  . 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 BIV UPGRADE N/A 04/20/2017   Procedure: BiV Upgrade;  Surgeon: Deboraha Sprang, MD;  Location: [place] CV LAB;  Service: Cardiovascular;  Laterality: N/A;  . CARDIAC CATHETERIZATION    . EYE SURGERY    . GLAUCOMA SURGERY Right   . INSERT / REPLACE / [place]; 12/26/13   STJ dual chamber pacemaker implanted 1999; gen change 12/2013 by Dr [name] - STJ   . PERMANENT PACEMAKER GENERATOR CHANGE N/A 01/02/2014   Procedure: PERMANENT PACEMAKER GENERATOR CHANGE;  Surgeon: Deboraha Sprang, MD;  Location: [facility] CATH LAB;  Service: Cardiovascular;  Laterality: N/A;  . TUBAL LIGATION    . VAGINAL HYSTERECTOMY      Current Outpatient Rx  . Order #: [id number] Class: Historical Med  . Order #: [id number] Class: Normal  . Order #: [id number] Class: Historical Med  . Order #: [id number] Class: Historical Med  . Order #: [id number] Class: Normal  . Order #: [id number] Class: Historical Med  . Order #: [id number] Class: Normal  . Order #: [id number] Class: Normal  . Order #: [id number] Class: Historical Med  . Order #: [id number] Class: Historical Med  . Order #: [id number] Class: Historical Med  .  Order #: [id number] Class: Historical Med  . Order #: [id number] Class: Normal    Allergies Morphine and related; Codeine; and Penicillins  Family History  Problem Relation Age of Onset  . Heart disease Father   . Asthma Father   . Rheum arthritis Mother   . Diabetes Sister   . Colon cancer Brother   . Colon cancer Brother   . Coronary artery disease Sister   . Coronary artery disease Sister     Social History Social History   Tobacco Use  . Smoking status: Former Smoker    Packs/day: 0.50    Years: 8.00    Pack years: 4.00    Types:  Cigarettes    Last attempt to quit: 10/06/1988    Years since quitting: 29.4  . Smokeless tobacco: Never Used  Substance Use Topics  . Alcohol use: No  . Drug use: No    Review of Systems  Constitutional: No fever/chills Eyes: No visual changes. ENT: No sore throat. Cardiovascular: Denies chest pain. Respiratory: Denies shortness of breath. Gastrointestinal: No abdominal pain.  No nausea, no vomiting.  No diarrhea.  No constipation. Genitourinary: Negative for dysuria. Musculoskeletal: Negative for back pain. Skin: Negative for rash. Neurological: Negative for focal weakness. Positive left face, arm, and left numbness. Positive HA.   10-point ROS otherwise negative.  ____________________________________________   PHYSICAL EXAM:  VITAL SIGNS: ED Triage Vitals [03/16/18 1825]  Enc Vitals Group     BP 133/73     Pulse Rate (!) 58     Resp 16     Temp 98.5 F (36.9 C)     Temp Source Oral     SpO2 100 %     Weight 194 lb (88 kg)     Height 5\' 3"  (1.6 m)     Pain Score 0   Constitutional: Alert and oriented. Well appearing and in no acute distress. Eyes: Conjunctivae are normal. PERRL. EOMI. Head: Atraumatic. Nose: No congestion/rhinnorhea. Mouth/Throat: Mucous membranes are moist.  Oropharynx non-erythematous. Neck: No stridor.  Cardiovascular: Normal rate, regular rhythm. Good peripheral circulation. Grossly normal heart sounds.   Respiratory: Normal respiratory effort.  No retractions. Lungs CTAB. Gastrointestinal: Soft and nontender. No distention.  Musculoskeletal: No lower extremity tenderness nor edema. No gross deformities of extremities. Neurologic:  Normal speech and language. No gross focal neurologic deficits are appreciated. Decreased sensation to light touch over the left face, arm, and leg. No weakness. No pronator drift. Normal finger-to-nose testing.  Skin:  Skin is warm, dry and intact. No rash noted.  ____________________________________________     LABS (all labs ordered are listed, but only abnormal results are displayed)  Labs Reviewed  COMPREHENSIVE METABOLIC PANEL - Abnormal; Notable for the following components:      Result Value   BUN 34 (*)    Creatinine, Ser 2.37 (*)    GFR calc non Af Amer 20 (*)    GFR calc Af Amer 23 (*)    All other components within normal limits  CBC WITH DIFFERENTIAL/PLATELET - Abnormal; Notable for the following components:   WBC 3.6 (*)    RBC 3.63 (*)    Hemoglobin 10.7 (*)    HCT 34.0 (*)    All other components within normal limits  PROTIME-INR - Abnormal; Notable for the following components:   Prothrombin Time 19.2 (*)    All other components within normal limits  ETHANOL  APTT  I-STAT TROPONIN, ED   ____________________________________________  EKG   EKG Interpretation  Date/Time:  Tuesday March 16 2018 22:57:00 EDT Ventricular Rate:  60 PR Interval:    QRS Duration: 185 QT Interval:  527 QTC Calculation: 527 R Axis:   112 Text Interpretation:  Sinus rhythm No STEMI.  Similar to prior.  Confirmed by Nanda Quinton [phone]) on 03/16/2018 11:13:28 PM       ____________________________________________  RADIOLOGY  Ct Head Wo Contrast  Result Date: 03/16/2018 CLINICAL DATA:  Headache beginning last evening with left-sided numbness today. EXAM: CT HEAD WITHOUT CONTRAST TECHNIQUE: Contiguous axial images were obtained from the base of the skull through the vertex without intravenous contrast. COMPARISON:  11/28/2005 FINDINGS: BRAIN: The ventricles and sulci are normal. No intraparenchymal hemorrhage, mass effect nor midline shift. No acute large vascular territory infarcts. Grey-white matter distinction is maintained. The basal ganglia are unremarkable. No abnormal extra-axial fluid collections. Basal cisterns are not effaced and midline. The brainstem and cerebellar hemispheres are without acute abnormalities. VASCULAR: Unremarkable. SKULL/SOFT TISSUES: No skull fracture. No  significant soft tissue swelling. ORBITS/SINUSES: The included ocular globes and orbital contents are normal.The mastoid air cells are clear. The included paranasal sinuses are well-aerated. OTHER: Punctate 2 mm nonspecific calcified density is seen anterior to the right globe. IMPRESSION: No acute intracranial abnormality identified. Nonspecific 2 mm calcification anterior to the right globe post between the globe and eyelid. Electronically Signed   By: Ashley Royalty M.D.   On: 03/16/2018 20:04    ____________________________________________   PROCEDURES  Procedure(s) performed:   Procedures  None ____________________________________________   INITIAL IMPRESSION / ASSESSMENT AND PLAN / ED COURSE  Pertinent labs & imaging results that were available during my care of the patient were reviewed by me and considered in my medical decision making (see chart for details).  Patient presents to the emergency department for evaluation of acute onset left-sided numbness without weakness or other neuro deficits.  The numbness is migratory and the patient later developed a headache.  Index of suspicion for Keflex migraine is elevated but considering CVA.  CT imaging and labs reviewed from triage with no acute findings.  I discussed the case with Dr. Leonel Ramsay with neurology who evaluated the patient.  Plan to treat headache and reevaluate.  If the patient's neurological deficits completely resolved with headache resolution the patient can be discharged.  If numbness lingers after headache is resolved then the patient will be admitted for stroke evaluation with concern for thalamic infarct.  Labs and CT head reviewed. Care transferred pending re-evaluation after migraine HA treatment.  ____________________________________________  FINAL CLINICAL IMPRESSION(S) / ED DIAGNOSES  Final diagnoses:  Numbness  Acute nonintractable headache, unspecified headache type     MEDICATIONS GIVEN DURING THIS  VISIT:  Medications  metoCLOPramide (REGLAN) injection 10 mg (10 mg Intravenous Given 03/16/18 2201)  valproate (DEPACON) 500 mg in dextrose 5 % 50 mL IVPB (500 mg Intravenous New Bag/Given 03/16/18 2211)    Note:  This document was prepared using Dragon voice recognition software and may include unintentional dictation errors.  Nanda Quinton, MD Emergency Medicine    Posie Lillibridge, [facility], MD 03/16/18 [phone]

## 2018-03-16 NOTE — ED Triage Notes (Signed)
Pt endorses left and facial leg tingling/numbness that began at 1730 and headache since last night. Pt states "I feel tingling from my left foot all the way up to my head" No weakness, unequal grip strength, facial droop, slurred speech, ataxia, visual changes. Pt complains of left leg swelling. PA room and advised not to call code stroke. VSS

## 2018-03-16 NOTE — ED Provider Notes (Signed)
Care assumed from Nanda Quinton, MD.  Please see his full H&P.  In short,  [name] is a 72 y.o. female with a history of CHF, CKD, hyperlipidemia, hypertension, paroxysmal A. fib presents for acute left-sided numbness and headaches onset 5:30 PM earlier today.  Patient denied associated weakness or vision changes.  No difficulty walking or speaking.  Initial provider found objectively decreased sensation over the left face arm and leg without weakness, pronator drift, dysmetria or other gross neurologic deficit.  Patient was evaluated by neurology who recommended treatment for complex migraine and reevaluation.  Physical Exam  BP 133/73 (BP Location: Right Arm)   Pulse (!) 58   Temp 98.5 F (36.9 C) (Oral)   Resp 16   Ht 5\' 3"  (1.6 m)   Wt 88 kg (194 lb)   SpO2 100%   BMI 34.37 kg/m   Physical Exam  Constitutional: She appears well-developed and well-nourished. No distress.  HENT:  Head: Normocephalic.  Eyes: Conjunctivae are normal. No scleral icterus.  Neck: Normal range of motion.  Cardiovascular: Normal rate and intact distal pulses.  Pulmonary/Chest: Effort normal.  Musculoskeletal: Normal range of motion.  Neurological: She is alert.  Patient ambulates in the hall with steady gait.  Skin: Skin is warm and dry.  Nursing note and vitals reviewed.   ED Course/Procedures   Clinical Course as of Mar 17 458  Tue Mar 16, 2018  2210 Plan: Patient has been given Reglan and Depakote.  Will reevaluate.  If headache and numbness have complete resolution patient may be discharged home.  If she maintains any residual numbness she will need admission for stroke work-up.  She is unable to have MRI.   [HM]  2330 With complete resolution of her numbness but headache persists.   [HM]  2426 Reassessed by neurology.  Discussed with Dr. Leonel Ramsay who recommends compazine and magnesium   [HM]  Wed Mar 17, 2018  0200 She reports significant improvement of her headache.  She feels  much better and wishes for discharge home.   [HM]    Clinical Course User Index [HM] Dina Mobley, Gwenlyn Perking    Procedures      MDM   Patient with headache.  Concern for comp gated migraine versus possible thalamic stroke.  Patient's symptoms resolved almost completely after treatment.  Her numbness did resolve completely and headache was significantly improved.  Gross repeat neurologic exam was without acute abnormalities.  Patient has been up ambulating in the hallway without difficulty.  She will need close primary care and neurology follow-up.  Discussed reasons to return immediately to the emergency department including return of symptoms.  Patient states understanding and is in agreement with the plan    Numbness  Acute nonintractable headache, unspecified headache type  Complicated migraine     Luria Rosario, Gwenlyn Perking 03/17/18 [place], April, MD 03/17/18 [id number]

## 2018-03-17 DIAGNOSIS — G43109 Migraine with aura, not intractable, without status migrainosus: Secondary | ICD-10-CM | POA: Diagnosis not present

## 2018-03-17 NOTE — Discharge Instructions (Addendum)
1. Medications:  usual home medications °2. Treatment: rest, drink plenty of fluids,  °3. Follow Up: Please followup with your primary doctor in 2-3 days for discussion of your diagnoses and further evaluation after today's visit; if you do not have a primary care doctor use the resource guide provided to find one; Please return to the ER for return of symptoms or other concerns. ° °

## 2018-03-25 ENCOUNTER — Ambulatory Visit (INDEPENDENT_AMBULATORY_CARE_PROVIDER_SITE_OTHER): Payer: Medicare Other

## 2018-03-25 DIAGNOSIS — I5022 Chronic systolic (congestive) heart failure: Secondary | ICD-10-CM

## 2018-03-25 DIAGNOSIS — Z950 Presence of cardiac pacemaker: Secondary | ICD-10-CM | POA: Diagnosis not present

## 2018-03-26 NOTE — Progress Notes (Signed)
EPIC Encounter for ICM Monitoring  Patient Name: [name] is a 72 y.o. female Date: 03/26/2018 Primary Care Physican: Carol Ada, MD Primary Cardiologist:Klein Electrophysiologist:Klein Dry Weight:195 lbs Bi-V Pacing:>99%       Heart Failure questions reviewed, pt symptomatic feeling sluggish but feels better now.  She went to ER last week due to her feet were swollen and she was having difficulty walking.  ER evaluated her for stroke and she has a neurology appointment for follow up.  She said she does drink a lot of water which I explained may cause some fluid accumulation.   Thoracic impedance normal.  Prescribed dosage: Furosemide20 mg take 1 tablet daily  Labs: 03/16/2018 Creatinine 2.37, BUN 34, Potassium 4.5, Sodium 141, EGFR 20-23 04/13/2017 Creatinine 2.17, BUN 23, Potassium 4.8, Sodium 143  Recommendations: No changes.  Reinforced fluid restriction to < 2 L daily and sodium restriction to less than 2000 mg daily.  Encouraged to call for fluid symptoms.  Follow-up plan: ICM clinic phone appointment on 04/26/2018.    Copy of ICM check sent to Dr. [name].   3 month ICM trend: 03/25/2018    1 Year ICM trend:       [name], RN 03/26/2018 8:02 AM

## 2018-04-04 ENCOUNTER — Other Ambulatory Visit: Payer: Self-pay | Admitting: Internal Medicine

## 2018-04-26 ENCOUNTER — Telehealth: Payer: Self-pay

## 2018-04-26 ENCOUNTER — Ambulatory Visit (INDEPENDENT_AMBULATORY_CARE_PROVIDER_SITE_OTHER): Payer: Medicare Other

## 2018-04-26 DIAGNOSIS — I5022 Chronic systolic (congestive) heart failure: Secondary | ICD-10-CM

## 2018-04-26 DIAGNOSIS — Z950 Presence of cardiac pacemaker: Secondary | ICD-10-CM

## 2018-04-26 NOTE — Progress Notes (Signed)
EPIC Encounter for ICM Monitoring  Patient Name: [name] is a 72 y.o. female Date: 04/26/2018 Primary Care Physican: Smith, Candace, MD Primary Cardiologist:Klein Electrophysiologist:Klein Dry Weight:190 lbs Bi-V Pacing:>99%       Heart Failure questions reviewed, pt asymptomatic.  She stated she is feeling fine.  She has a neurology appt next month.   Thoracic impedance normal.  Prescribed dosage: Furosemide20 mg take 1 tablet daily  Labs: 03/16/2018 Creatinine 2.37, BUN 34, Potassium 4.5, Sodium 141, EGFR 20-23 04/13/2017 Creatinine 2.17, BUN 23, Potassium 4.8, Sodium 143  Recommendations: No changes.   Encouraged to call for fluid symptoms.  Follow-up plan: ICM clinic phone appointment on 05/27/2018.    Copy of ICM check sent to Dr. Klein.   3 month ICM trend: 04/26/2018    1 Year ICM trend:       Laurie S Short, RN 04/26/2018 2:14 PM   

## 2018-04-26 NOTE — Telephone Encounter (Signed)
I spoke with the patient about this.

## 2018-05-04 ENCOUNTER — Other Ambulatory Visit: Payer: Self-pay | Admitting: Internal Medicine

## 2018-05-25 ENCOUNTER — Encounter: Payer: Self-pay | Admitting: *Deleted

## 2018-05-25 ENCOUNTER — Ambulatory Visit (INDEPENDENT_AMBULATORY_CARE_PROVIDER_SITE_OTHER): Payer: Medicare Other | Admitting: Neurology

## 2018-05-25 ENCOUNTER — Encounter: Payer: Self-pay | Admitting: Neurology

## 2018-05-25 VITALS — BP 125/73 | HR 69 | Ht 63.0 in | Wt 189.0 lb

## 2018-05-25 DIAGNOSIS — G4733 Obstructive sleep apnea (adult) (pediatric): Secondary | ICD-10-CM | POA: Diagnosis not present

## 2018-05-25 DIAGNOSIS — G43109 Migraine with aura, not intractable, without status migrainosus: Secondary | ICD-10-CM | POA: Diagnosis not present

## 2018-05-25 NOTE — Progress Notes (Signed)
[facility]    Provider:  Dr Jaynee Eagles Referring Provider: Carol Ada, MD Primary Care Physician:  Carol Ada, MD  CC:  Sharp pain in head.   HPI:  [name] is a 72 y.o. female here as requested by Dr. Tamala Julian for complicated migraine 2/95/2841. PMHx HLD, cardiomyopathy, HTN, CKD3, glaucoma, sarcoidosis of lung, migraines, TIA, RA, pacemaker, PAF on eliquis.  Several years ago she had similar episode of weakness in the setting of a migraine. She has been having sharp pains on the top of the head, they are brief, painful, 8-9/10 in pain, not very often once a week. She wake up with headaches. She has OSA and does not use her cpap. She has had a sleep test within the year, stopped cpap an headaches are worsening. Discussed cpap use.   Reviewed notes, labs and imaging from outside physicians, which showed:  AHI 16.8, SaO2 low 75%  Reviewed Dr. Thompson Caul notes. She was seen in the ER 03/16/2018 due to left-sided numbness and headache, noted mild anemia 10.7, lasted about 20 minutes and then had a headache later, remote history of migraines, no more episodes, has had a slight headache since then, no current neuro deficits.  Reviewed ED notes from 03/16/2018. Presented with left-sided numbness and headache. Exam showed decreased sensation left arm and face no weakness or pronator drist or other focal neuro symptoms. Evaluated by neurology and Dxed with complicated migraine. Symptoms resolved after migraine treatment. She has had auras in the past. Recommended Reglan/depakote for migraines.    CT head 03/16/2018 showed No acute intracranial abnormalities including mass lesion or mass effect, hydrocephalus, extra-axial fluid collection, midline shift, hemorrhage, or acute infarction, large ischemic events (personally reviewed images)  Echo 10/2017: reviewed report: Impressions:  - Compared to a prior study in 07/2017, there have been no   significant changes. A trivial  posterior pericardial effusion is   noted.   Review of Systems: Patient complains of symptoms per HPI as well as the following symptoms:headace . Pertinent negatives and positives per HPI. All others negative.   Social History   Socioeconomic History  . Marital status: Legally Separated    Spouse name: Not on file  . Number of children: 5  . Years of education: Not on file  . Highest education level: Some college, no degree  Occupational History  . Occupation: disabled  Social Needs  . Financial resource strain: Not on file  . Food insecurity:    Worry: Not on file    Inability: Not on file  . Transportation needs:    Medical: Not on file    Non-medical: Not on file  Tobacco Use  . Smoking status: Former Smoker    Packs/day: 0.50    Years: 8.00    Pack years: 4.00    Types: Cigarettes    Last attempt to quit: 10/06/1988    Years since quitting: 29.6  . Smokeless tobacco: Never Used  Substance and Sexual Activity  . Alcohol use: No  . Drug use: No  . Sexual activity: Never  Lifestyle  . Physical activity:    Days per week: Not on file    Minutes per session: Not on file  . Stress: Not on file  Relationships  . Social connections:    Talks on phone: Not on file    Gets together: Not on file    Attends religious service: Not on file    Active member of club or organization: Not on file  Attends meetings of clubs or organizations: Not on file    Relationship status: Not on file  . Intimate partner violence:    Fear of current or ex partner: Not on file    Emotionally abused: Not on file    Physically abused: Not on file    Forced sexual activity: Not on file  Other Topics Concern  . Not on file  Social History Narrative   Retired.    Lives at home alone   Right handed   Caffeine: none     Family History  Problem Relation Age of Onset  . Heart disease Father   . Asthma Father   . Rheum arthritis Mother   . Diabetes Sister   . Colon cancer Brother   .  Colon cancer Brother   . Coronary artery disease Sister   . Coronary artery disease Sister     Past Medical History:  Diagnosis Date  . Arthritis    "all over" (04/20/2017)  . Atrial tachycardia ([place])   . Cardiomyopathy ([place])    due to sarcoid  . Cataract   . CHF (congestive heart failure) ([place])   . CKD (chronic kidney disease), stage III ([place])   . Complete heart block (HCC)    a. s/p STJ dual chamber pacemaker 1999; upgrade to CRTP 2018  . Diverticulosis   . Glaucoma   . HLD (hyperlipidemia)   . HTN (hypertension)   . Iritis   . NICM (nonischemic cardiomyopathy) ([place])    echo 1/11: EF 35-40%, mod LVH, Grade 1 diast dysfxn, mild BAE;    cath 2/06: luminal irregs;  Myoview 10/2:  no scar or ischemia, EF 37%   . OSA (obstructive sleep apnea) 08/21/2016   "never RX'd mask" (04/20/2017)  . Pacemaker    SA node dysfunction; Dr. [name]  . Paroxysmal atrial fibrillation (HCC)   . Rheumatoid arthritis ([place])    Dr. Trudie Reed  . 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 Trudie Reed  . Stroke [facility]) ~ 2016   "mini-stroke"; denies residual on 04/20/2017)  . Syncope 05/2011   PRESYNCOPE  . Tuberculosis 1950s/1960s?    Past Surgical History:  Procedure Laterality Date  . BIV UPGRADE N/A 04/20/2017   Procedure: BiV Upgrade;  Surgeon: Deboraha Sprang, MD;  Location: [facility];  Service: Cardiovascular;  Laterality: N/A;  . CARDIAC CATHETERIZATION    . CESAREAN SECTION    . colonoscopy  11/17/2017   polyps, repeat 5 years Dr. Therisa Doyne  . EYE SURGERY    . GLAUCOMA SURGERY Right   . INSERT / REPLACE / [place]; 12/26/13   STJ dual chamber pacemaker implanted 1999; gen change 12/2013 by Dr [name] - STJ   . PERMANENT PACEMAKER GENERATOR CHANGE N/A 01/02/2014   Procedure: PERMANENT PACEMAKER GENERATOR CHANGE;  Surgeon: Deboraha Sprang, MD;  Location: [facility] CATH LAB;  Service: Cardiovascular;  Laterality: N/A;  . TUBAL LIGATION    . VAGINAL  HYSTERECTOMY      Current Outpatient Medications  Medication Sig Dispense Refill  . albuterol (PROAIR HFA) 108 (90 BASE) MCG/ACT inhaler Inhale 2 puffs into the lungs every 6 (six) hours as needed for wheezing. 1 Inhaler 3  . bimatoprost (LUMIGAN) 0.01 % SOLN Place 1 drop into the right eye at bedtime.     . carvedilol (COREG) 25 MG tablet TAKE 1 TABLET BY MOUTH TWICE DAILY WITH A MEAL (Patient taking differently: Take 12.5 mg by  mouth 2 (two) times daily with a meal. ) 180 tablet 1  . cycloSPORINE (RESTASIS) 0.05 % ophthalmic emulsion Place 1 drop into the right eye 2 (two) times daily.     Marland Kitchen ELIQUIS 5 MG TABS tablet TAKE 1 TABLET BY MOUTH TWICE DAILY 60 tablet 5  . ezetimibe (ZETIA) 10 MG tablet Take 10 mg by mouth daily at 2 PM.     . famotidine (PEPCID) 20 MG tablet TAKE 1 TABLET BY MOUTH TWICE DAILY (Patient taking differently: TAKE 1 TABLET BY MOUTH TWICE DAILY (in the morning & in the afternoon)) 60 tablet 6  . furosemide (LASIX) 20 MG tablet TAKE 1 TABLET BY MOUTH EVERY DAY FOR SWELLING AND SHORTNESS OF BREATH 90 tablet 1  . hydroxychloroquine (PLAQUENIL) 200 MG tablet Take 400 mg by mouth daily.     Javier Docker Oil 300 MG CAPS Take by mouth.    . loteprednol (LOTEMAX) 0.5 % ophthalmic suspension Place 1 drop into both eyes 4 (four) times daily.     . Multiple Minerals (MINERAL COMPLEX PO) Take by mouth.    . Multiple Vitamins-Minerals (PRESERVISION/LUTEIN) CAPS Take 1 capsule by mouth 2 (two) times daily. In the morning & afternoon    . nitroGLYCERIN (NITROSTAT) 0.4 MG SL tablet Place 0.4 mg under the tongue every 5 (five) minutes as needed for chest pain. For chest pain     No current facility-administered medications for this visit.     Allergies as of 05/25/2018 - Review Complete 05/25/2018  Allergen Reaction Noted  . Morphine and related Anaphylaxis 09/07/2012  . Codeine Itching   . Methotrexate derivatives  05/25/2018  . Penicillins Itching     Vitals: BP 125/73 (BP  Location: Right Arm, Patient Position: Sitting)   Pulse 69   Ht 5\' 3"  (1.6 m)   Wt 189 lb (85.7 kg)   BMI 33.48 kg/m  Last Weight:  Wt Readings from Last 1 Encounters:  05/25/18 189 lb (85.7 kg)   Last Height:   Ht Readings from Last 1 Encounters:  05/25/18 5\' 3"  (1.6 m)  Physical exam: Exam: Gen: NAD, conversant, well nourised, obese, well groomed                     CV: RRR, no MRG. No Carotid Bruits. No peripheral edema, warm, nontender Eyes: Conjunctivae clear without exudates or hemorrhage  Neuro: Detailed Neurologic Exam  Speech:    Speech is normal; fluent and spontaneous with normal comprehension.  Cognition:    The patient is oriented to person, place, and time;     recent and remote memory intact;     language fluent;     normal attention, concentration,     fund of knowledge Cranial Nerves:    The pupils are equal, round, and reactive to light. Attempted fundoscopic exam could not visualize. Visual fields are full to finger confrontation. Extraocular movements are intact. Trigeminal sensation is intact and the muscles of mastication are normal. The face is symmetric. The palate elevates in the midline. Hearing intact. Voice is normal. Shoulder shrug is normal. The tongue has normal motion without fasciculations.   Coordination:    No dysmetria  Gait:  No ataxia  Motor Observation:    No asymmetry, no atrophy, and no involuntary movements noted. Tone:    Normal muscle tone.    Posture:    Posture is normal. normal erect    Strength:    Strength is essentially normal in the upper and  lower limbs slight decrease in LE      Sensation: intact to LT     Reflex Exam:  DTR's:    Deep tendon reflexes in the upper and lower extremities are symmetrical bilaterally.   Toes:    The toes are equiv bilaterally.   Clonus:    Clonus is absent.        Assessment/Plan:  72 year old with PHx migraines and complicated migraines. She has untreated OSA recently  stopped using the cpap hich is when migraines/headaches worsened.  - discussed OSA risks such as stroke, heart disease, headaches, fatigue. Needs follow up with Dr. Llana Aliment - Otherwise weight loss will help. Referral to [facility] - h/as worsened since stopping cpap needs to restart Follow up as neede - Needs close follow up with vasculr risk factors - She says she cant use mask because it irritates her but she can just go to DME company and get a liner. Discussed.  I had a long d/w patient about her acute onset numbness.Continue Eliquis for stroke prevention and maintain strict control of hypertension with blood pressure goal below 130/90, diabetes with hemoglobin A1c goal below 6.5% and lipids with LDL cholesterol goal below 70 mg/dL I also advised the patient to eat a healthy diet with plenty of whole grains, cereals, fruits and vegetables, exercise regularly and maintain ideal body weight .  Needs to address sleep apnea  Orders Placed This Encounter  Procedures  . Ambulatory referral to [facility]     RTC as needed     Sarina Ill, MD  [facility] [address] [employer], [place] [address]  Phone [phone] Fax [phone]

## 2018-05-25 NOTE — Patient Instructions (Addendum)
Call DME company and ask for a mask Liner, this happens all the time Follow up for sleep apnea [facility]    Sleep Apnea Sleep apnea is a condition in which breathing pauses or becomes shallow during sleep. Episodes of sleep apnea usually last 10 seconds or longer, and they may occur as many as 20 times an hour. Sleep apnea disrupts your sleep and keeps your body from getting the rest that it needs. This condition can increase your risk of certain health problems, including:  Heart attack.  Stroke.  Obesity.  Diabetes.  Heart failure.  Irregular heartbeat.  There are three kinds of sleep apnea:  Obstructive sleep apnea. This kind is caused by a blocked or collapsed airway.  Central sleep apnea. This kind happens when the part of the brain that controls breathing does not send the correct signals to the muscles that control breathing.  Mixed sleep apnea. This is a combination of obstructive and central sleep apnea.  What are the causes? The most common cause of this condition is a collapsed or blocked airway. An airway can collapse or become blocked if:  Your throat muscles are abnormally relaxed.  Your tongue and tonsils are larger than normal.  You are overweight.  Your airway is smaller than normal.  What increases the risk? This condition is more likely to develop in people who:  Are overweight.  Smoke.  Have a smaller than normal airway.  Are elderly.  Are female.  Drink alcohol.  Take sedatives or tranquilizers.  Have a family history of sleep apnea.  What are the signs or symptoms? Symptoms of this condition include:  Trouble staying asleep.  Daytime sleepiness and tiredness.  Irritability.  Loud snoring.  Morning headaches.  Trouble concentrating.  Forgetfulness.  Decreased interest in sex.  Unexplained sleepiness.  Mood swings.  Personality changes.  Feelings of depression.  Waking up often during the  night to urinate.  Dry mouth.  Sore throat.  How is this diagnosed? This condition may be diagnosed with:  A medical history.  A physical exam.  A series of tests that are done while you are sleeping (sleep study). These tests are usually done in a sleep lab, but they may also be done at home.  How is this treated? Treatment for this condition aims to restore normal breathing and to ease symptoms during sleep. It may involve managing health issues that can affect breathing, such as high blood pressure or obesity. Treatment may include:  Sleeping on your side.  Using a decongestant if you have nasal congestion.  Avoiding the use of depressants, including alcohol, sedatives, and narcotics.  Losing weight if you are overweight.  Making changes to your diet.  Quitting smoking.  Using a device to open your airway while you sleep, such as: ? An oral appliance. This is a custom-made mouthpiece that shifts your lower jaw forward. ? A continuous positive airway pressure (CPAP) device. This device delivers oxygen to your airway through a mask. ? A nasal expiratory positive airway pressure (EPAP) device. This device has valves that you put into each nostril. ? A bi-level positive airway pressure (BPAP) device. This device delivers oxygen to your airway through a mask.  Surgery if other treatments do not work. During surgery, excess tissue is removed to create a wider airway.  It is important to get treatment for sleep apnea. Without treatment, this condition can lead to:  High blood pressure.  Coronary artery disease.  (Men)  An inability to achieve or maintain an erection (impotence).  Reduced thinking abilities.  Follow these instructions at home:  Make any lifestyle changes that your health care provider recommends.  Eat a healthy, well-balanced diet.  Take over-the-counter and prescription medicines only as told by your health care provider.  Avoid using depressants,  including alcohol, sedatives, and narcotics.  Take steps to lose weight if you are overweight.  If you were given a device to open your airway while you sleep, use it only as told by your health care provider.  Do not use any tobacco products, such as cigarettes, chewing tobacco, and e-cigarettes. If you need help quitting, ask your health care provider.  Keep all follow-up visits as told by your health care provider. This is important. Contact a health care provider if:  The device that you received to open your airway during sleep is uncomfortable or does not seem to be working.  Your symptoms do not improve.  Your symptoms get worse. Get help right away if:  You develop chest pain.  You develop shortness of breath.  You develop discomfort in your back, arms, or stomach.  You have trouble speaking.  You have weakness on one side of your body.  You have drooping in your face. These symptoms may represent a serious problem that is an emergency. Do not wait to see if the symptoms will go away. Get medical help right away. Call your local emergency services (911 in the [place] Do not drive yourself to the hospital. This information is not intended to replace advice given to you by your health care provider. Make sure you discuss any questions you have with your health care provider. Document Released: 09/12/2002 Document Revised: 05/18/2016 Document Reviewed: 07/02/2015 Elsevier Interactive Patient Education  [employer].

## 2018-05-27 ENCOUNTER — Ambulatory Visit (INDEPENDENT_AMBULATORY_CARE_PROVIDER_SITE_OTHER): Payer: Medicare Other | Admitting: *Deleted

## 2018-05-27 ENCOUNTER — Ambulatory Visit (INDEPENDENT_AMBULATORY_CARE_PROVIDER_SITE_OTHER): Payer: Medicare Other

## 2018-05-27 DIAGNOSIS — I428 Other cardiomyopathies: Secondary | ICD-10-CM

## 2018-05-27 DIAGNOSIS — I5022 Chronic systolic (congestive) heart failure: Secondary | ICD-10-CM

## 2018-05-27 DIAGNOSIS — I442 Atrioventricular block, complete: Secondary | ICD-10-CM

## 2018-05-27 DIAGNOSIS — Z950 Presence of cardiac pacemaker: Secondary | ICD-10-CM | POA: Diagnosis not present

## 2018-05-27 NOTE — Progress Notes (Signed)
Remote pacemaker transmission.   

## 2018-05-28 ENCOUNTER — Encounter: Payer: Self-pay | Admitting: Cardiology

## 2018-05-28 NOTE — Progress Notes (Signed)
Letter  

## 2018-05-28 NOTE — Progress Notes (Signed)
EPIC Encounter for ICM Monitoring  Patient Name: [name] is a 72 y.o. female Date: 05/28/2018 Primary Care Physican: Carol Ada, MD Primary Cardiologist:Klein Electrophysiologist:Klein Dry Weight: 190 lbs Bi-V Pacing:>99%  AT/AF Burden 2.3% (was <1% on 04/26/18 transmission)                                                   Heart Failure questions reviewed, pt asymptomatic.     Thoracic impedance normal.  Prescribed dosage: Furosemide20 mg take 1 tablet daily  Labs: 03/16/2018 Creatinine 2.37, BUN 34, Potassium 4.5, Sodium 141, EGFR 20-23 04/13/2017 Creatinine 2.17, BUN 23, Potassium 4.8, Sodium 143  Recommendations: No changes.   Encouraged to call for fluid symptoms.  Follow-up plan: ICM clinic phone appointment on 06/28/2018.       Copy of ICM check sent to Dr. [name].   3 month ICM trend: 05/28/2018    AT/AF     1 Year ICM trend:       [name], RN 05/28/2018 8:45 AM

## 2018-07-02 LAB — CUP PACEART REMOTE DEVICE CHECK
Battery Remaining Longevity: 84 mo
Battery Remaining Percentage: 95 %
Brady Statistic RA Percent Paced: 93 %
Date Time Interrogation Session: 20190927191102
Implantable Lead Implant Date: 19990414
Implantable Lead Location: 753858
Implantable Lead Location: 753859
Implantable Lead Location: 753860
Lead Channel Pacing Threshold Amplitude: 1.25 V
Lead Channel Setting Pacing Amplitude: 2.25 V
Lead Channel Setting Pacing Pulse Width: 0.5 ms
Lead Channel Setting Pacing Pulse Width: 0.6 ms
Lead Channel Setting Sensing Sensitivity: 4 mV
MDC IDC LEAD IMPLANT DT: 19990414
MDC IDC LEAD IMPLANT DT: 20180716
MDC IDC MSMT LEADCHNL LV IMPEDANCE VALUE: 1325 Ohm
MDC IDC MSMT LEADCHNL LV PACING THRESHOLD AMPLITUDE: 1.5 V
MDC IDC MSMT LEADCHNL LV PACING THRESHOLD PULSEWIDTH: 0.6 ms
MDC IDC MSMT LEADCHNL RA IMPEDANCE VALUE: 430 Ohm
MDC IDC MSMT LEADCHNL RV IMPEDANCE VALUE: 340 Ohm
MDC IDC MSMT LEADCHNL RV PACING THRESHOLD PULSEWIDTH: 0.5 ms
MDC IDC PG IMPLANT DT: 20150330
MDC IDC SET LEADCHNL RA PACING AMPLITUDE: 2 V
MDC IDC SET LEADCHNL RV PACING AMPLITUDE: 2.25 V
MDC IDC STAT BRADY RV PERCENT PACED: 99 % — AB
Pulse Gen Model: 3242
Pulse Gen Serial Number: 7610861

## 2018-07-09 NOTE — Progress Notes (Signed)
No ICM remote transmission received for 06/28/2018 and next ICM transmission scheduled for 07/22/2018.

## 2018-07-22 ENCOUNTER — Ambulatory Visit (INDEPENDENT_AMBULATORY_CARE_PROVIDER_SITE_OTHER): Payer: Medicare Other

## 2018-07-22 DIAGNOSIS — I5022 Chronic systolic (congestive) heart failure: Secondary | ICD-10-CM | POA: Diagnosis not present

## 2018-07-22 DIAGNOSIS — Z950 Presence of cardiac pacemaker: Secondary | ICD-10-CM

## 2018-07-23 NOTE — Progress Notes (Signed)
EPIC Encounter for ICM Monitoring  Patient Name: [name] is a 72 y.o. female Date: 07/23/2018 Primary Care Physican: Carol Ada, MD Primary Cardiologist:Klein Electrophysiologist:Klein Dry Weight: 190lbs Bi-V Pacing:>99%   AT/AF Burden: 3%     Heart Failure questions reviewed, pt asymptomatic.  Will start working with nutritionist next week   Thoracic impedance abnormal suggesting fluid accumulation starting 07/19/2018 but trending close to baseline   Prescribed: Furosemide20 mg take 1 tablet daily  Labs: 03/16/2018 Creatinine 2.37, BUN 34, Potassium 4.5, Sodium 141, EGFR 20-23 04/13/2017 Creatinine 2.17, BUN 23, Potassium 4.8, Sodium 143  Recommendations: No changes.    Encouraged to call for fluid symptoms.  Follow-up plan: ICM clinic phone appointment on 08/26/2018.       Copy of ICM check sent to Dr. [name].   3 month ICM trend: 07/22/2018    AT/AF    1 Year ICM trend:       [name], RN 07/23/2018 7:47 AM

## 2018-07-29 ENCOUNTER — Encounter (INDEPENDENT_AMBULATORY_CARE_PROVIDER_SITE_OTHER): Payer: Medicare Other

## 2018-08-12 ENCOUNTER — Ambulatory Visit (INDEPENDENT_AMBULATORY_CARE_PROVIDER_SITE_OTHER): Payer: Medicare Other | Admitting: Family Medicine

## 2018-08-12 ENCOUNTER — Encounter (INDEPENDENT_AMBULATORY_CARE_PROVIDER_SITE_OTHER): Payer: Self-pay | Admitting: Family Medicine

## 2018-08-12 VITALS — BP 115/76 | HR 83 | Temp 97.5°F | Ht 63.0 in | Wt 183.0 lb

## 2018-08-12 DIAGNOSIS — E669 Obesity, unspecified: Secondary | ICD-10-CM

## 2018-08-12 DIAGNOSIS — Z6832 Body mass index (BMI) 32.0-32.9, adult: Secondary | ICD-10-CM | POA: Diagnosis not present

## 2018-08-12 DIAGNOSIS — R5383 Other fatigue: Secondary | ICD-10-CM | POA: Diagnosis not present

## 2018-08-12 DIAGNOSIS — Z1331 Encounter for screening for depression: Secondary | ICD-10-CM | POA: Diagnosis not present

## 2018-08-12 DIAGNOSIS — E46 Unspecified protein-calorie malnutrition: Secondary | ICD-10-CM | POA: Insufficient documentation

## 2018-08-12 DIAGNOSIS — I509 Heart failure, unspecified: Secondary | ICD-10-CM

## 2018-08-12 DIAGNOSIS — E7849 Other hyperlipidemia: Secondary | ICD-10-CM | POA: Insufficient documentation

## 2018-08-12 DIAGNOSIS — N185 Chronic kidney disease, stage 5: Secondary | ICD-10-CM | POA: Insufficient documentation

## 2018-08-12 DIAGNOSIS — R739 Hyperglycemia, unspecified: Secondary | ICD-10-CM | POA: Insufficient documentation

## 2018-08-12 DIAGNOSIS — N184 Chronic kidney disease, stage 4 (severe): Secondary | ICD-10-CM

## 2018-08-12 DIAGNOSIS — R0602 Shortness of breath: Secondary | ICD-10-CM | POA: Diagnosis not present

## 2018-08-12 DIAGNOSIS — D638 Anemia in other chronic diseases classified elsewhere: Secondary | ICD-10-CM | POA: Insufficient documentation

## 2018-08-12 DIAGNOSIS — Z0289 Encounter for other administrative examinations: Secondary | ICD-10-CM

## 2018-08-12 DIAGNOSIS — E559 Vitamin D deficiency, unspecified: Secondary | ICD-10-CM | POA: Insufficient documentation

## 2018-08-13 LAB — COMPREHENSIVE METABOLIC PANEL
ALT: 15 IU/L (ref 0–32)
AST: 26 IU/L (ref 0–40)
Albumin/Globulin Ratio: 1.6 (ref 1.2–2.2)
Albumin: 4.2 g/dL (ref 3.5–4.8)
Alkaline Phosphatase: 77 IU/L (ref 39–117)
BUN / CREAT RATIO: 10 — AB (ref 12–28)
BUN: 24 mg/dL (ref 8–27)
Bilirubin Total: 0.5 mg/dL (ref 0.0–1.2)
CALCIUM: 10.2 mg/dL (ref 8.7–10.3)
CHLORIDE: 100 mmol/L (ref 96–106)
CO2: 23 mmol/L (ref 20–29)
Creatinine, Ser: 2.32 mg/dL — ABNORMAL HIGH (ref 0.57–1.00)
GFR, EST AFRICAN AMERICAN: 24 mL/min/{1.73_m2} — AB (ref >59)
GFR, EST NON AFRICAN AMERICAN: 21 mL/min/{1.73_m2} — AB (ref >59)
GLUCOSE: 80 mg/dL (ref 65–99)
Globulin, Total: 2.6 g/dL (ref 1.5–4.5)
POTASSIUM: 4.5 mmol/L (ref 3.5–5.2)
SODIUM: 138 mmol/L (ref 134–144)
TOTAL PROTEIN: 6.8 g/dL (ref 6.0–8.5)

## 2018-08-13 LAB — CBC WITH DIFFERENTIAL
BASOS: 1 %
Basophils Absolute: 0 10*3/uL (ref 0.0–0.2)
EOS (ABSOLUTE): 0 10*3/uL (ref 0.0–0.4)
EOS: 0 %
HEMATOCRIT: 33.5 % — AB (ref 34.0–46.6)
Hemoglobin: 11.1 g/dL (ref 11.1–15.9)
Immature Grans (Abs): 0 10*3/uL (ref 0.0–0.1)
Immature Granulocytes: 0 %
Lymphocytes Absolute: 0.6 10*3/uL — ABNORMAL LOW (ref 0.7–3.1)
Lymphs: 16 %
MCH: 30.1 pg (ref 26.6–33.0)
MCHC: 33.1 g/dL (ref 31.5–35.7)
MCV: 91 fL (ref 79–97)
MONOS ABS: 0.3 10*3/uL (ref 0.1–0.9)
Monocytes: 9 %
NEUTROS ABS: 2.8 10*3/uL (ref 1.4–7.0)
Neutrophils: 74 %
RBC: 3.69 x10E6/uL — AB (ref 3.77–5.28)
RDW: 12.4 % (ref 12.3–15.4)
WBC: 3.8 10*3/uL (ref 3.4–10.8)

## 2018-08-13 LAB — T4, FREE: Free T4: 1.49 ng/dL (ref 0.82–1.77)

## 2018-08-13 LAB — LIPID PANEL WITH LDL/HDL RATIO
Cholesterol, Total: 223 mg/dL — ABNORMAL HIGH (ref 100–199)
HDL: 60 mg/dL (ref >39)
LDL Calculated: 138 mg/dL — ABNORMAL HIGH (ref 0–99)
LDL/HDL RATIO: 2.3 ratio (ref 0.0–3.2)
TRIGLYCERIDES: 123 mg/dL (ref 0–149)
VLDL CHOLESTEROL CAL: 25 mg/dL (ref 5–40)

## 2018-08-13 LAB — VITAMIN D 25 HYDROXY (VIT D DEFICIENCY, FRACTURES): Vit D, 25-Hydroxy: 23.3 ng/mL — ABNORMAL LOW (ref 30.0–100.0)

## 2018-08-13 LAB — T3: T3, Total: 115 ng/dL (ref 71–180)

## 2018-08-13 LAB — FOLATE: Folate: 13.7 ng/mL

## 2018-08-13 LAB — HEMOGLOBIN A1C
ESTIMATED AVERAGE GLUCOSE: 111 mg/dL
Hgb A1c MFr Bld: 5.5 % (ref 4.8–5.6)

## 2018-08-13 LAB — VITAMIN B12: Vitamin B-12: 1067 pg/mL (ref 232–1245)

## 2018-08-13 LAB — TSH: TSH: 3.02 u[IU]/mL (ref 0.450–4.500)

## 2018-08-13 LAB — INSULIN, RANDOM: INSULIN: 14.6 u[IU]/mL (ref 2.6–24.9)

## 2018-08-16 NOTE — Progress Notes (Signed)
.  Office: [phone]  /  Fax: [phone]   HPI:   Chief Complaint: [name] (MR# [id number]) is a 72 y.o. female who presents on 08/12/2018 for obesity evaluation and treatment. Current BMI is Body mass index is 32.42 kg/m.Marland Kitchen [name] has struggled with obesity for years and has been unsuccessful in either losing weight or maintaining long term weight loss. [name] attended our information session and states she is currently in the action stage of change and ready to dedicate time achieving and maintaining a healthier weight.   [name] states she is trying to eat vegan, as she feels this causes bloating.  [name] states her desired weight loss is 63 lbs she started gaining weight when she started having health problems her heaviest weight ever was 200 lbs she has significant food cravings issues  she skips meals frequently she is trying to eat vegan she is frequently drinking liquids with calories she frequently makes poor food choices she frequently eats larger portions than normal  she struggles with emotional eating    Fatigue [name] feels her energy is lower than it should be. This has worsened with weight gain and has not worsened recently. [name] admits to daytime somnolence and  denies waking up still tired. Patient is at risk for obstructive sleep apnea. Patent has a history of symptoms of daytime fatigue and morning headache. Patient generally gets 6 hours of sleep per night, and states they generally have nightime awakenings and generally restful sleep. Snoring is not present. Apneic episodes are present. Epworth Sleepiness Score is 5.  Dyspnea on exertion [name] notes increasing shortness of breath with exercising and seems to be worsening over time with weight gain. She notes getting out of breath sooner with activity than she used to. This has not gotten worse recently. [name] denies orthopnea.  Congestive Heart Failure [name] has congestive heart  failure. She is on Lasix 20 mg. She has systolic dysfunction with EF 40-45 %, with pacemaker.  Chronic Renal Impairment Stage IV [name] has stage 4 chronic renal impairment. Her glomerular rate is 23. She is on Lasix for congestive heart failure, and she states she has a Nephrologist but has not discussed dialysis yet.  Anemia of Chronic Disease [name] has a diagnosis of anemia. Last hemoglobin was low and she notes fatigue. She is not on iron supplementation.    Vitamin D Deficiency [name] has a diagnosis of vitamin D deficiency. She is not on Vit D, she notes fatigue and denies nausea, vomiting or muscle weakness.  Malnutrition [name] has been trying to eat vegan and her protein intake is very low. Her RMR is lower than expected.  Hyperglycemia [name] has a history of some elevated blood glucose readings without a history of diabetes mellitus. She denies polyphagia.  Hyperlipidemia [name] has hyperlipidemia, she is on krill Kline and not on statin. She is attempting to improve her cholesterol levels with intensive lifestyle modification including a low saturated fat diet, exercise and weight loss. She denies any chest pain, claudication or myalgias.  Depression Screen [name]'s Food and Mood (modified PHQ-9) score was  Depression screen PHQ 2/9 08/12/2018  Decreased Interest 1  Down, Depressed, Hopeless 1  PHQ - 2 Score 2  Altered sleeping 1  Tired, decreased energy 1  Change in appetite 1  Feeling bad or failure about yourself  0  Trouble concentrating 0  Moving slowly or fidgety/restless 0  Suicidal thoughts 0  PHQ-9 Score 5  Difficult doing work/chores Not difficult  at all    ALLERGIES: Allergies  Allergen Reactions  . Morphine And Related Anaphylaxis    Throat closing  . Codeine Itching  . Methotrexate Derivatives     Itchy rash   . Penicillins Itching    Has patient had a PCN reaction causing immediate rash, facial/tongue/throat swelling, SOB or  lightheadedness with hypotension: No Has patient had a PCN reaction causing severe rash involving mucus membranes or skin necrosis: No Has patient had a PCN reaction that required hospitalization: No Has patient had a PCN reaction occurring within the last 10 years: No If all of the above answers are "NO", then may proceed with Cephalosporin use.     MEDICATIONS: Current Outpatient Medications on File Prior to Visit  Medication Sig Dispense Refill  . bimatoprost (LUMIGAN) 0.01 % SOLN Place 1 drop into the right eye at bedtime.     . carvedilol (COREG) 25 MG tablet TAKE 1 TABLET BY MOUTH TWICE DAILY WITH A MEAL (Patient taking differently: Take 12.5 mg by mouth 2 (two) times daily with a meal. ) 180 tablet 1  . cycloSPORINE (RESTASIS) 0.05 % ophthalmic emulsion Place 1 drop into the right eye 2 (two) times daily.     Marland Kitchen ELIQUIS 5 MG TABS tablet TAKE 1 TABLET BY MOUTH TWICE DAILY 60 tablet 5  . ezetimibe (ZETIA) 10 MG tablet Take 10 mg by mouth daily at 2 PM.     . famotidine (PEPCID) 20 MG tablet TAKE 1 TABLET BY MOUTH TWICE DAILY (Patient taking differently: TAKE 1 TABLET BY MOUTH TWICE DAILY (in the morning & in the afternoon)) 60 tablet 6  . furosemide (LASIX) 20 MG tablet TAKE 1 TABLET BY MOUTH EVERY DAY FOR SWELLING AND SHORTNESS OF BREATH 90 tablet 1  . hydroxychloroquine (PLAQUENIL) 200 MG tablet Take 400 mg by mouth daily.     [name] 300 MG CAPS Take by mouth.    . loteprednol (LOTEMAX) 0.5 % ophthalmic suspension Place 1 drop into both eyes 4 (four) times daily.     . Multiple Vitamins-Minerals (PRESERVISION/LUTEIN) CAPS Take 1 capsule by mouth 2 (two) times daily. In the morning & afternoon    . nitroGLYCERIN (NITROSTAT) 0.4 MG SL tablet Place 0.4 mg under the tongue every 5 (five) minutes as needed for chest pain. For chest pain     No current facility-administered medications on file prior to visit.     PAST MEDICAL HISTORY: Past Medical History:  Diagnosis Date  . Anemia    . Arthritis    "all over" (04/20/2017)  . Atrial tachycardia ([place])   . Back pain   . 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 Cataract   . CHF (congestive heart failure) ([place])   . CKD (chronic kidney disease), stage III ([place])   . Complete heart block (HCC)    a. s/p STJ dual chamber pacemaker 1999; upgrade to CRTP 2018  . Constipation   . COPD (chronic obstructive pulmonary disease) ([place])   . Diverticulosis   . Dyspnea   . Glaucoma   . HLD (hyperlipidemia)   . HTN (hypertension)   . Iritis   . Joint pain   . Leg edema   . NICM (nonischemic cardiomyopathy) ([place])    echo 1/11: EF 35-40%, mod LVH, Grade 1 diast dysfxn, mild BAE;    cath 2/06: luminal irregs;  Myoview 10/2:  no scar or ischemia, EF 37%   . OSA (obstructive sleep apnea) 08/21/2016   "never  RX'd mask" (04/20/2017)  . Pacemaker    SA node dysfunction; Dr. [name]  . Paroxysmal atrial fibrillation (HCC)   . Rheumatic fever   . Rheumatoid arthritis ([place])    Dr. Trudie Reed  . 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 Trudie Reed  . Stomach ulcer   . Stroke [facility]) ~ 2016   "mini-stroke"; denies residual on 04/20/2017)  . Syncope 05/2011   PRESYNCOPE  . Tuberculosis 1950s/1960s?    PAST SURGICAL HISTORY: Past Surgical History:  Procedure Laterality Date  . BIV UPGRADE N/A 04/20/2017   Procedure: BiV Upgrade;  Surgeon: Deboraha Sprang, MD;  Location: [facility];  Service: Cardiovascular;  Laterality: N/A;  . CARDIAC CATHETERIZATION    . CESAREAN SECTION    . colonoscopy  11/17/2017   polyps, repeat 5 years Dr. Therisa Doyne  . EYE SURGERY    . GLAUCOMA SURGERY Right   . INSERT / REPLACE / [place]; 12/26/13   STJ dual chamber pacemaker implanted 1999; gen change 12/2013 by Dr [name] - STJ   . PERMANENT PACEMAKER GENERATOR CHANGE N/A 01/02/2014   Procedure: PERMANENT PACEMAKER GENERATOR CHANGE;  Surgeon: Deboraha Sprang, MD;  Location: [facility] CATH LAB;  Service: Cardiovascular;   Laterality: N/A;  . TUBAL LIGATION    . VAGINAL HYSTERECTOMY      SOCIAL HISTORY: Social History   Tobacco Use  . Smoking status: Former Smoker    Packs/day: 0.50    Years: 8.00    Pack years: 4.00    Types: Cigarettes    Last attempt to quit: 10/06/1988    Years since quitting: 29.8  . Smokeless tobacco: Never Used  Substance Use Topics  . Alcohol use: No  . Drug use: No    FAMILY HISTORY: Family History  Problem Relation Age of Onset  . Heart disease Father   . Asthma Father   . Stroke Father   . Alcoholism Father   . Rheum arthritis Mother   . Stroke Mother   . Diabetes Sister   . Colon cancer Brother   . Colon cancer Brother   . Coronary artery disease Sister   . Coronary artery disease Sister     ROS: Review of Systems  Constitutional: Positive for malaise/fatigue. Negative for weight loss.       + Trouble sleeping  Eyes:       + Wear glasses or contacts  Respiratory: Positive for cough, shortness of breath (with exertion) and wheezing.   Cardiovascular: Negative for chest pain, orthopnea and claudication.  Gastrointestinal: Positive for constipation. Negative for nausea and vomiting.  Genitourinary: Positive for frequency.  Musculoskeletal: Positive for back pain. Negative for myalgias.       Negative muscle weakness + Muscle or joint pain  Neurological: Positive for headaches.  Endo/Heme/Allergies: Bruises/bleeds easily.       Negative polyphagia Negative hypoglycemia    PHYSICAL EXAM: Blood pressure 115/76, pulse 83, temperature (!) 97.5 F (36.4 C), temperature source Oral, height 5\' 3"  (1.6 m), weight 183 lb (83 kg), SpO2 99 %. Body mass index is 32.42 kg/m. Physical Exam  Constitutional: She is oriented to person, place, and time. She appears well-developed and well-nourished.  HENT:  Head: Normocephalic and atraumatic.  Nose: Nose normal.  Eyes: EOM are normal. No scleral icterus.  Neck: Normal range of motion. Neck supple. No thyromegaly  present.  Cardiovascular: Normal rate and regular rhythm.  Pulmonary/Chest: Effort normal. No respiratory distress.  Abdominal: Soft. There is no tenderness.  + Obesity  Musculoskeletal:  Range of Motions normal in all 4 extremities Trace edema noted in bilateral lower extremities  Neurological: She is alert and oriented to person, place, and time. Coordination normal.  Skin: Skin is warm and dry.  Psychiatric: She has a normal mood and affect. Her behavior is normal.  + Her speech is slow and she doesn't always answer questions completely  Vitals reviewed.   RECENT LABS AND TESTS: BMET    Component Value Date/Time   NA 138 08/12/2018 0958   K 4.5 08/12/2018 0958   CL 100 08/12/2018 0958   CO2 23 08/12/2018 0958   GLUCOSE 80 08/12/2018 0958   GLUCOSE 74 03/16/2018 1827   BUN 24 08/12/2018 0958   CREATININE 2.32 (H) 08/12/2018 0958   CREATININE 1.82 (H) 03/24/2016 0806   CALCIUM 10.2 08/12/2018 0958   GFRNONAA 21 (L) 08/12/2018 0958   GFRAA 24 (L) 08/12/2018 0958   Lab Results  Component Value Date   HGBA1C 5.5 08/12/2018   Lab Results  Component Value Date   INSULIN 14.6 08/12/2018   CBC    Component Value Date/Time   WBC 3.8 08/12/2018 0958   WBC 3.6 (L) 03/16/2018 1827   RBC 3.69 (L) 08/12/2018 0958   RBC 3.63 (L) 03/16/2018 1827   HGB 11.1 08/12/2018 0958   HCT 33.5 (L) 08/12/2018 0958   PLT 170 03/16/2018 1827   PLT 225 04/13/2017 1054   MCV 91 08/12/2018 0958   MCH 30.1 08/12/2018 0958   MCH 29.5 03/16/2018 1827   MCHC 33.1 08/12/2018 0958   MCHC 31.5 03/16/2018 1827   RDW 12.4 08/12/2018 0958   LYMPHSABS 0.6 (L) 08/12/2018 0958   MONOABS 0.4 03/16/2018 1827   EOSABS 0.0 08/12/2018 0958   BASOSABS 0.0 08/12/2018 0958   Iron/TIBC/Ferritin/ %Sat    Component Value Date/Time   IRON 76 05/24/2011 0240   TIBC 336 05/24/2011 0240   FERRITIN 176 05/24/2011 0240   IRONPCTSAT 23 05/24/2011 0240   Lipid Panel     Component Value Date/Time   CHOL 223  (H) 08/12/2018 0958   TRIG 123 08/12/2018 0958   HDL 60 08/12/2018 0958   LDLCALC 138 (H) 08/12/2018 0958   Hepatic Function Panel     Component Value Date/Time   PROT 6.8 08/12/2018 0958   ALBUMIN 4.2 08/12/2018 0958   AST 26 08/12/2018 0958   ALT 15 08/12/2018 0958   ALKPHOS 77 08/12/2018 0958   BILITOT 0.5 08/12/2018 0958      Component Value Date/Time   TSH 3.020 08/12/2018 0958   Vitamin D No recent labs  ECG  shows NSR with a rate of 84 BPM INDIRECT CALORIMETER done today shows a VO2 of 161 and a REE of 1123. Her calculated basal metabolic rate is 0272 thus her basal metabolic rate is worse than expected.    ASSESSMENT AND PLAN: Other fatigue - Plan: EKG 12-Lead, T3, T4, free, TSH  Shortness of breath on exertion - Plan: Lipid Panel With LDL/HDL Ratio  Other congestive heart failure (HCC)  Chronic renal impairment, stage 5 (HCC)  Anemia of chronic disease - Plan: CBC With Differential  Vitamin D deficiency - Plan: VITAMIN D 25 Hydroxy (Vit-D Deficiency, Fractures)  Malnutrition, unspecified type ([place]) - Plan: Vitamin B12, Folate  Hyperglycemia - Plan: Hemoglobin A1c, Insulin, random, Comprehensive metabolic panel  Other hyperlipidemia  Depression screening  Class 1 obesity with serious comorbidity and body mass index (BMI)  of 32.0 to 32.9 in adult, unspecified obesity type  PLAN:  Fatigue [name] was informed that her fatigue may be related to obesity, depression or many other causes. Labs will be ordered, and in the meanwhile [name] has agreed to work on diet, exercise and weight loss to help with fatigue. Proper sleep hygiene was discussed including the need for 7-8 hours of quality sleep each night. A sleep study was not ordered based on symptoms and Epworth score.  Dyspnea on exertion [name]'s shortness of breath appears to be obesity related and exercise induced. She has agreed to work on weight loss and gradually increase exercise to treat her  exercise induced shortness of breath. If Jina follows our instructions and loses weight without improvement of her shortness of breath, we will plan to refer to pulmonology. We will monitor this condition regularly. Shabana agrees to this plan.  Congestive Heart Failure [name] is to start a low sodium diet, and she agrees to follow up with our clinic in 2 weeks.  Chronic Renal Impairment Stage IV We will check labs today and [name] agrees to follow up with our clinic in 2 weeks.  Anemia of Chronic Disease The diagnosis of anemia was discussed with [name] and was explained in detail. She was given suggestions of iron rich foods and iron supplement was not prescribed. We will check labs today and [name] agrees to follow up with our clinic in 2 weeks.  Vitamin D Deficiency [name] was informed that low vitamin D levels contributes to fatigue and are associated with obesity, breast, and colon cancer. She will follow up for routine testing of vitamin D, at least 2-3 times per year. She was informed of the risk of over-replacement of vitamin D and agrees to not increase her dose unless she discusses this with [place] first. We will check labs today and [name] agrees to follow up with our clinic in 2 weeks.  Malnutrition Samanda was given a nutritionally balanced diet plan. We will check vitamin levels today and [name] agrees to follow up with our clinic in 2 weeks.   Hyperglycemia Fasting labs will be obtained and results with be discussed with [name] in 2 weeks at her follow up visit. In the meanwhile [name] was started on a lower simple carbohydrate diet and will work on weight loss efforts.  Hyperlipidemia [name] was informed of the American Heart Association Guidelines emphasizing intensive lifestyle modifications as the first line treatment for hyperlipidemia. We discussed many lifestyle modifications today in depth, and [name] will continue to work on decreasing saturated fats such as  fatty red meat, butter and many fried foods. She will start diet prescription, and will also increase vegetables and lean protein in her diet and continue to work on exercise and weight loss efforts.  Depression Screen Shian had a mildly positive depression screening. Depression is commonly associated with obesity and often results in emotional eating behaviors. We will monitor this closely and work on CBT to help improve the non-hunger eating patterns. Referral to Psychology may be required if no improvement is seen as she continues in our clinic.  Obesity Shaunda is currently in the action stage of change and her goal is to continue with weight loss efforts She has agreed to follow the Category 1 plan Demiana has been instructed to work up to a goal of 150 minutes of combined cardio and strengthening exercise per week for weight loss and overall health benefits. We discussed the following Behavioral Modification Strategies today: increasing lean protein  intake, decreasing simple carbohydrates, decreasing sodium intake, and work on meal planning and easy cooking plans  Seirra has agreed to follow up with our clinic in 2 weeks. She was informed of the importance of frequent follow up visits to maximize her success with intensive lifestyle modifications for her multiple health conditions. She was informed we would discuss her lab results at her next visit unless there is a critical issue that needs to be addressed sooner. Maleea agreed to keep her next visit at the agreed upon time to discuss these results.    OBESITY BEHAVIORAL INTERVENTION VISIT  Today's visit was # 1   Starting weight: 183 lbs Starting date: 08/12/18 Today's weight : 183 lbs  Today's date: 08/12/2018 Total lbs lost to date: 0 At least 15 minutes were spent on discussing the following behavioral intervention visit.   ASK: We discussed the diagnosis of obesity with Lillien L Geno today and Lanisa agreed to give [place]  permission to discuss obesity behavioral modification therapy today.  ASSESS: Kynslee has the diagnosis of obesity and her BMI today is 32.42 Valyncia is in the action stage of change   ADVISE: Janneth was educated on the multiple health risks of obesity as well as the benefit of weight loss to improve her health. She was advised of the need for long term treatment and the importance of lifestyle modifications to improve her current health and to decrease her risk of future health problems.  AGREE: Multiple dietary modification options and treatment options were discussed and  Markiya agreed to follow the recommendations documented in the above note.  ARRANGE: Wannetta was educated on the importance of frequent visits to treat obesity as outlined per CMS and USPSTF guidelines and agreed to schedule her next follow up appointment today.   I, Trixie Dredge, am acting as transcriptionist for Dennard Nip, MD   I have reviewed the above documentation for accuracy and completeness, and I agree with the above. -Dennard Nip, MD

## 2018-08-26 ENCOUNTER — Ambulatory Visit (INDEPENDENT_AMBULATORY_CARE_PROVIDER_SITE_OTHER): Payer: Medicare Other

## 2018-08-26 ENCOUNTER — Ambulatory Visit (INDEPENDENT_AMBULATORY_CARE_PROVIDER_SITE_OTHER): Payer: Medicare Other | Admitting: Family Medicine

## 2018-08-26 VITALS — BP 118/75 | HR 80 | Temp 98.3°F | Ht 63.0 in | Wt 182.0 lb

## 2018-08-26 DIAGNOSIS — E8881 Metabolic syndrome: Secondary | ICD-10-CM

## 2018-08-26 DIAGNOSIS — Z6832 Body mass index (BMI) 32.0-32.9, adult: Secondary | ICD-10-CM | POA: Diagnosis not present

## 2018-08-26 DIAGNOSIS — N184 Chronic kidney disease, stage 4 (severe): Secondary | ICD-10-CM | POA: Diagnosis not present

## 2018-08-26 DIAGNOSIS — I442 Atrioventricular block, complete: Secondary | ICD-10-CM

## 2018-08-26 DIAGNOSIS — E669 Obesity, unspecified: Secondary | ICD-10-CM

## 2018-08-26 DIAGNOSIS — E559 Vitamin D deficiency, unspecified: Secondary | ICD-10-CM | POA: Diagnosis not present

## 2018-08-26 DIAGNOSIS — I5022 Chronic systolic (congestive) heart failure: Secondary | ICD-10-CM

## 2018-08-26 DIAGNOSIS — Z950 Presence of cardiac pacemaker: Secondary | ICD-10-CM | POA: Diagnosis not present

## 2018-08-26 DIAGNOSIS — E66811 Obesity, class 1: Secondary | ICD-10-CM

## 2018-08-26 DIAGNOSIS — E88819 Insulin resistance, unspecified: Secondary | ICD-10-CM

## 2018-08-26 NOTE — Progress Notes (Signed)
EPIC Encounter for ICM Monitoring  Patient Name: [name] is a 72 y.o. female Date: 08/26/2018 Primary Care Physican: Carol Ada, MD Primary Cardiologist:Klein Electrophysiologist:Klein Bi-V Pacing:>99%      Last Weight: 190lbs Today's Weight:  unknown  AT/AF 3%       Attempted call to patient and unable to reach.    Transmission reviewed.    Thoracic impedance normal.   Prescribed: Furosemide20 mg take 1 tablet daily  Labs: 03/16/2018 Creatinine 2.37, BUN 34, Potassium 4.5, Sodium 141, EGFR 20-23 04/13/2017 Creatinine 2.17, BUN 23, Potassium 4.8, Sodium 143  Recommendations: Unable to reach.  Follow-up plan: ICM clinic phone appointment on 09/27/2018.     Copy of ICM check sent to Dr. [name].   3 month ICM trend: 08/26/2018    1 Year ICM trend:       [name], RN 08/26/2018 11:23 AM

## 2018-08-26 NOTE — Progress Notes (Signed)
Remote pacemaker transmission.   

## 2018-08-27 ENCOUNTER — Telehealth: Payer: Self-pay

## 2018-08-27 ENCOUNTER — Encounter: Payer: Self-pay | Admitting: Cardiology

## 2018-08-27 NOTE — Telephone Encounter (Signed)
Remote ICM transmission received.  Attempted call to patient regarding ICM remote transmission and no answer.  

## 2018-08-31 NOTE — Progress Notes (Signed)
Office: [phone]  /  Fax: [phone]   HPI:   Chief Complaint: OBESITY Yolette is here to discuss her progress with her obesity treatment plan. She is following the Category 1 plan and is following her eating plan approximately 95 % of the time. She states she is exercising 0 minutes 0 times per week. Letisha did well following her Category 1 Plan. Her hunger was controlled. She did well meal planning and prepping.  Her weight is 182 lb (82.6 kg) today and has had a weight loss of 1 pounds over a period of 2 weeks since her last visit. She has lost 1 lb since starting treatment with [place].  Chronic Renal Insufficiency Stage 4 Jaeleigh has a GFR of 21. She is being followed by nephrology.   Insulin Resistance Rayhana has a diagnosis of insulin resistance based on her elevated fasting insulin level >5. She has decreased polyphagia on her diet Rx. Although Lovelee's blood glucose readings are still under good control, insulin resistance puts her at greater risk of metabolic syndrome and diabetes. She is not taking metformin currently and continues to work on diet and exercise to decrease risk of diabetes.  Vitamin D deficiency Khaleesi has a diagnosis of vitamin D deficiency and complains of fatigue. She is not currently taking vit D as her OTC Vit D was stopped by her nephrologist. She denies nausea, vomiting or muscle weakness.    ALLERGIES: Allergies  Allergen Reactions  . Morphine And Related Anaphylaxis    Throat closing  . Codeine Itching  . Methotrexate Derivatives     Itchy rash   . Penicillins Itching    Has patient had a PCN reaction causing immediate rash, facial/tongue/throat swelling, SOB or lightheadedness with hypotension: No Has patient had a PCN reaction causing severe rash involving mucus membranes or skin necrosis: No Has patient had a PCN reaction that required hospitalization: No Has patient had a PCN reaction occurring within the last 10 years: No If all of  the above answers are "NO", then may proceed with Cephalosporin use.     MEDICATIONS: Current Outpatient Medications on File Prior to Visit  Medication Sig Dispense Refill  . bimatoprost (LUMIGAN) 0.01 % SOLN Place 1 drop into the right eye at bedtime.     . carvedilol (COREG) 25 MG tablet TAKE 1 TABLET BY MOUTH TWICE DAILY WITH A MEAL (Patient taking differently: Take 12.5 mg by mouth 2 (two) times daily with a meal. ) 180 tablet 1  . cycloSPORINE (RESTASIS) 0.05 % ophthalmic emulsion Place 1 drop into the right eye 2 (two) times daily.     Marland Kitchen ELIQUIS 5 MG TABS tablet TAKE 1 TABLET BY MOUTH TWICE DAILY 60 tablet 5  . ezetimibe (ZETIA) 10 MG tablet Take 10 mg by mouth daily at 2 PM.     . famotidine (PEPCID) 20 MG tablet TAKE 1 TABLET BY MOUTH TWICE DAILY (Patient taking differently: TAKE 1 TABLET BY MOUTH TWICE DAILY (in the morning & in the afternoon)) 60 tablet 6  . furosemide (LASIX) 20 MG tablet TAKE 1 TABLET BY MOUTH EVERY DAY FOR SWELLING AND SHORTNESS OF BREATH 90 tablet 1  . hydroxychloroquine (PLAQUENIL) 200 MG tablet Take 400 mg by mouth daily.     Javier Docker Oil 300 MG CAPS Take by mouth.    . loteprednol (LOTEMAX) 0.5 % ophthalmic suspension Place 1 drop into both eyes 4 (four) times daily.     . Multiple Vitamins-Minerals (PRESERVISION/LUTEIN) CAPS Take 1 capsule by  mouth 2 (two) times daily. In the morning & afternoon    . nitroGLYCERIN (NITROSTAT) 0.4 MG SL tablet Place 0.4 mg under the tongue every 5 (five) minutes as needed for chest pain. For chest pain     No current facility-administered medications on file prior to visit.     PAST MEDICAL HISTORY: Past Medical History:  Diagnosis Date  . Anemia   . Arthritis    "all over" (04/20/2017)  . Atrial tachycardia ([place])   . Back pain   . 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 Cataract   . CHF (congestive heart failure) ([place])   . CKD (chronic kidney disease), stage III ([place])   . Complete heart block (HCC)    a. s/p STJ  dual chamber pacemaker 1999; upgrade to CRTP 2018  . Constipation   . COPD (chronic obstructive pulmonary disease) ([place])   . Diverticulosis   . Dyspnea   . Glaucoma   . HLD (hyperlipidemia)   . HTN (hypertension)   . Iritis   . Joint pain   . Leg edema   . NICM (nonischemic cardiomyopathy) ([place])    echo 1/11: EF 35-40%, mod LVH, Grade 1 diast dysfxn, mild BAE;    cath 2/06: luminal irregs;  Myoview 10/2:  no scar or ischemia, EF 37%   . OSA (obstructive sleep apnea) 08/21/2016   "never RX'd mask" (04/20/2017)  . Pacemaker    SA node dysfunction; Dr. [name]  . Paroxysmal atrial fibrillation (HCC)   . Rheumatic fever   . Rheumatoid arthritis ([place])    Dr. Trudie Reed  . 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 Trudie Reed  . Stomach ulcer   . Stroke [facility]) ~ 2016   "mini-stroke"; denies residual on 04/20/2017)  . Syncope 05/2011   PRESYNCOPE  . Tuberculosis 1950s/1960s?    PAST SURGICAL HISTORY: Past Surgical History:  Procedure Laterality Date  . BIV UPGRADE N/A 04/20/2017   Procedure: BiV Upgrade;  Surgeon: Deboraha Sprang, MD;  Location: [facility];  Service: Cardiovascular;  Laterality: N/A;  . CARDIAC CATHETERIZATION    . CESAREAN SECTION    . colonoscopy  11/17/2017   polyps, repeat 5 years Dr. Therisa Doyne  . EYE SURGERY    . GLAUCOMA SURGERY Right   . INSERT / REPLACE / [place]; 12/26/13   STJ dual chamber pacemaker implanted 1999; gen change 12/2013 by Dr [name] - STJ   . PERMANENT PACEMAKER GENERATOR CHANGE N/A 01/02/2014   Procedure: PERMANENT PACEMAKER GENERATOR CHANGE;  Surgeon: Deboraha Sprang, MD;  Location[facility] CATH LAB;  Service: Cardiovascular;  Laterality: N/A;  . TUBAL LIGATION    . VAGINAL HYSTERECTOMY      SOCIAL HISTORY: Social History   Tobacco Use  . Smoking status: Former Smoker    Packs/day: 0.50    Years: 8.00    Pack years: 4.00    Types: Cigarettes    Last attempt to quit: 10/06/1988    Years since  quitting: 29.9  . Smokeless tobacco: Never Used  Substance Use Topics  . Alcohol use: No  . Drug use: No    FAMILY HISTORY: Family History  Problem Relation Age of Onset  . Heart disease Father   . Asthma Father   . Stroke Father   . Alcoholism Father   . Rheum arthritis Mother   . Stroke Mother   . Diabetes Sister   . Colon cancer  Brother   . Colon cancer Brother   . Coronary artery disease Sister   . Coronary artery disease Sister     ROS: Review of Systems  Constitutional: Positive for malaise/fatigue and weight loss.  Gastrointestinal: Negative for nausea and vomiting.  Musculoskeletal:       Negative for muscle weakness  Endo/Heme/Allergies:       Negative for polyphagia    PHYSICAL EXAM: Blood pressure 118/75, pulse 80, temperature 98.3 F (36.8 C), temperature source Oral, height 5\' 3"  (1.6 m), weight 182 lb (82.6 kg), SpO2 97 %. Body mass index is 32.24 kg/m. Physical Exam  Constitutional: She is oriented to person, place, and time. She appears well-developed and well-nourished.  Cardiovascular: Normal rate.  Pulmonary/Chest: Effort normal.  Musculoskeletal: Normal range of motion.  Neurological: She is alert and oriented to person, place, and time.  Skin: Skin is warm and dry.  Psychiatric: She has a normal mood and affect. Her behavior is normal.  Vitals reviewed.   RECENT LABS AND TESTS: BMET    Component Value Date/Time   NA 138 08/12/2018 0958   K 4.5 08/12/2018 0958   CL 100 08/12/2018 0958   CO2 23 08/12/2018 0958   GLUCOSE 80 08/12/2018 0958   GLUCOSE 74 03/16/2018 1827   BUN 24 08/12/2018 0958   CREATININE 2.32 (H) 08/12/2018 0958   CREATININE 1.82 (H) 03/24/2016 0806   CALCIUM 10.2 08/12/2018 0958   GFRNONAA 21 (L) 08/12/2018 0958   GFRAA 24 (L) 08/12/2018 0958   Lab Results  Component Value Date   HGBA1C 5.5 08/12/2018   Lab Results  Component Value Date   INSULIN 14.6 08/12/2018   CBC    Component Value Date/Time   WBC  3.8 08/12/2018 0958   WBC 3.6 (L) 03/16/2018 1827   RBC 3.69 (L) 08/12/2018 0958   RBC 3.63 (L) 03/16/2018 1827   HGB 11.1 08/12/2018 0958   HCT 33.5 (L) 08/12/2018 0958   PLT 170 03/16/2018 1827   PLT 225 04/13/2017 1054   MCV 91 08/12/2018 0958   MCH 30.1 08/12/2018 0958   MCH 29.5 03/16/2018 1827   MCHC 33.1 08/12/2018 0958   MCHC 31.5 03/16/2018 1827   RDW 12.4 08/12/2018 0958   LYMPHSABS 0.6 (L) 08/12/2018 0958   MONOABS 0.4 03/16/2018 1827   EOSABS 0.0 08/12/2018 0958   BASOSABS 0.0 08/12/2018 0958   Iron/TIBC/Ferritin/ %Sat    Component Value Date/Time   IRON 76 05/24/2011 0240   TIBC 336 05/24/2011 0240   FERRITIN 176 05/24/2011 0240   IRONPCTSAT 23 05/24/2011 0240   Lipid Panel     Component Value Date/Time   CHOL 223 (H) 08/12/2018 0958   TRIG 123 08/12/2018 0958   HDL 60 08/12/2018 0958   LDLCALC 138 (H) 08/12/2018 0958   Hepatic Function Panel     Component Value Date/Time   PROT 6.8 08/12/2018 0958   ALBUMIN 4.2 08/12/2018 0958   AST 26 08/12/2018 0958   ALT 15 08/12/2018 0958   ALKPHOS 77 08/12/2018 0958   BILITOT 0.5 08/12/2018 0958      Component Value Date/Time   TSH 3.020 08/12/2018 0958   TSH 2.540 02/18/2017 1043   TSH 2.271 05/24/2011 0240   Results for CORABELLE, SPACKMAN (MRN [id number]) as of 08/31/2018 17:50  Ref. Range 08/12/2018 09:58  Vitamin D, 25-Hydroxy Latest Ref Range: 30.0 - 100.0 ng/mL 23.3 (L)   ASSESSMENT AND PLAN: Stage 4 chronic kidney disease (HCC)  Insulin resistance  Vitamin  D deficiency  Class 1 obesity with serious comorbidity and body mass index (BMI) of 32.0 to 32.9 in adult, unspecified obesity type  PLAN: Chronic Renal Insufficiency Stage 4 Pt is to continue on a steady (but not increased) protein diet. She is to increase her H2O intake to at least 80 oz/qd. Huxley will continue to work on weight loss. Nasra agrees to follow up with our office in 2 weeks.   Insulin Resistance Danique will continue to  work on weight loss, exercise, and decreasing simple carbohydrates in her diet to help decrease the risk of diabetes. We dicussed metformin including benefits and risks. She was informed that eating too many simple carbohydrates or too many calories at one sitting increases the likelihood of GI side effects. Kili cannot take Metformin due to her renal function. She will continue to work on diet plan.  Aurorah agrees to follow up with our office in 2 weeks.   Vitamin D Deficiency Ellisyn was informed that low vitamin D levels contributes to fatigue and are associated with obesity, breast, and colon cancer. She agrees to continue to eat a moderate calcium diet and will follow up for routine testing of vitamin D, at least 2-3 times per year. She was informed of the risk of over-replacement of vitamin D and agrees to not increase her dose unless she discusses this with [place] first. [place] agrees to follow up with our office in 2 weeks.   Obesity Marit has a diagnosis of insulin resistance based on her elevated fasting insulin level >5. Although Kaetlyn's blood glucose readings are still under good control, insulin resistance puts her at greater risk of metabolic syndrome and diabetes. She continues to work on diet and exercise to decrease risk of diabetes. Kaitlynne is currently in the action stage of change. As such, her goal is to continue with weight loss efforts She has agreed to follow the Category 1 plan Telissa has been instructed to work up to a goal of 150 minutes of combined cardio and strengthening exercise per week for weight loss and overall health benefits. We discussed the following Behavioral Modification Stratagies today: increasing lean protein intake, decreasing simple carbohydrates , work on meal planning and easy cooking plans and holiday eating strategies   Catina has agreed to follow up with our clinic in 2 weeks. She was informed of the importance of frequent follow up visits to  maximize her success with intensive lifestyle modifications for her multiple health conditions.   OBESITY BEHAVIORAL INTERVENTION VISIT  Today's visit was # 2   Starting weight: 183 lbs Starting date: 08/12/2018 Today's weight : Weight: 182 lb (82.6 kg)  Today's date: 08/26/2018 Total lbs lost to date: 1 lb  At least 15 minutes were spent on discussing the following behavioral intervention visit.   ASK: We discussed the diagnosis of obesity with Alfonso L Vrooman today and Rucha agreed to give [place] permission to discuss obesity behavioral modification therapy today.  ASSESS: Ermine has the diagnosis of obesity and her BMI today is @TBMI @ Roxsana is in the action stage of change   ADVISE: Beautiful was educated on the multiple health risks of obesity as well as the benefit of weight loss to improve her health. She was advised of the need for long term treatment and the importance of lifestyle modifications to improve her current health and to decrease her risk of future health problems.  AGREE: Multiple dietary modification options and treatment options were discussed and  Annalysa agreed to follow  the recommendations documented in the above note.  ARRANGE: Marlenne was educated on the importance of frequent visits to treat obesity as outlined per CMS and USPSTF guidelines and agreed to schedule her next follow up appointment today.  I, Remi Deter, CMA, am acting as transcriptionist for Dennard Nip, MD  I have reviewed the above documentation for accuracy and completeness, and I agree with the above. -Dennard Nip, MD

## 2018-09-07 ENCOUNTER — Encounter (INDEPENDENT_AMBULATORY_CARE_PROVIDER_SITE_OTHER): Payer: Self-pay | Admitting: Family Medicine

## 2018-09-13 ENCOUNTER — Encounter (INDEPENDENT_AMBULATORY_CARE_PROVIDER_SITE_OTHER): Payer: Self-pay | Admitting: Family Medicine

## 2018-09-13 ENCOUNTER — Ambulatory Visit (INDEPENDENT_AMBULATORY_CARE_PROVIDER_SITE_OTHER): Payer: Medicare Other | Admitting: Family Medicine

## 2018-09-13 VITALS — BP 119/76 | HR 80 | Temp 98.1°F | Ht 63.0 in | Wt 182.0 lb

## 2018-09-13 DIAGNOSIS — E669 Obesity, unspecified: Secondary | ICD-10-CM

## 2018-09-13 DIAGNOSIS — E8881 Metabolic syndrome: Secondary | ICD-10-CM | POA: Diagnosis not present

## 2018-09-13 DIAGNOSIS — Z6832 Body mass index (BMI) 32.0-32.9, adult: Secondary | ICD-10-CM

## 2018-09-13 NOTE — Progress Notes (Signed)
Office: [phone]  /  Fax: [phone]   HPI:   Chief Complaint: OBESITY [name] is here to discuss her progress with her obesity treatment plan. She is on the  follow the Category 1 plan and is following her eating plan approximately 90 % of the time. She states she is exercising 0 minutes 0 times per week. [name] tried to keep indulgences to a minimum over Thanksgiving.  Her weight is 182 lb (82.6 kg) today and she has maintained her weight since her last visit. She has lost 1 lb since starting treatment with [place].  Insulin Resistance [name] has a diagnosis of insulin resistance based on her elevated fasting insulin level >5. Although [name]'s blood glucose readings are still under good control, insulin resistance puts her at greater risk of metabolic syndrome and diabetes. She is not taking metformin currently and continues to work on diet and exercise to decrease risk of diabetes. She denies polyphagia.    ALLERGIES: Allergies  Allergen Reactions  . Morphine And Related Anaphylaxis    Throat closing  . Codeine Itching  . Methotrexate Derivatives     Itchy rash   . Penicillins Itching    Has patient had a PCN reaction causing immediate rash, facial/tongue/throat swelling, SOB or lightheadedness with hypotension: No Has patient had a PCN reaction causing severe rash involving mucus membranes or skin necrosis: No Has patient had a PCN reaction that required hospitalization: No Has patient had a PCN reaction occurring within the last 10 years: No If all of the above answers are "NO", then may proceed with Cephalosporin use.     MEDICATIONS: Current Outpatient Medications on File Prior to Visit  Medication Sig Dispense Refill  . bimatoprost (LUMIGAN) 0.01 % SOLN Place 1 drop into the right eye at bedtime.     . carvedilol (COREG) 25 MG tablet TAKE 1 TABLET BY MOUTH TWICE DAILY WITH A MEAL (Patient taking differently: Take 12.5 mg by mouth 2 (two) times daily with a meal. )  180 tablet 1  . cycloSPORINE (RESTASIS) 0.05 % ophthalmic emulsion Place 1 drop into the right eye 2 (two) times daily.     Marland Kitchen ELIQUIS 5 MG TABS tablet TAKE 1 TABLET BY MOUTH TWICE DAILY 60 tablet 5  . ezetimibe (ZETIA) 10 MG tablet Take 10 mg by mouth daily at 2 PM.     . famotidine (PEPCID) 20 MG tablet TAKE 1 TABLET BY MOUTH TWICE DAILY (Patient taking differently: TAKE 1 TABLET BY MOUTH TWICE DAILY (in the morning & in the afternoon)) 60 tablet 6  . furosemide (LASIX) 20 MG tablet TAKE 1 TABLET BY MOUTH EVERY DAY FOR SWELLING AND SHORTNESS OF BREATH 90 tablet 1  . hydroxychloroquine (PLAQUENIL) 200 MG tablet Take 400 mg by mouth daily.     Javier Docker Oil 300 MG CAPS Take by mouth.    . loteprednol (LOTEMAX) 0.5 % ophthalmic suspension Place 1 drop into both eyes 4 (four) times daily.     . Multiple Vitamins-Minerals (PRESERVISION/LUTEIN) CAPS Take 1 capsule by mouth 2 (two) times daily. In the morning & afternoon    . nitroGLYCERIN (NITROSTAT) 0.4 MG SL tablet Place 0.4 mg under the tongue every 5 (five) minutes as needed for chest pain. For chest pain     No current facility-administered medications on file prior to visit.     PAST MEDICAL HISTORY: Past Medical History:  Diagnosis Date  . Anemia   . Arthritis    "all over" (04/20/2017)  .  Atrial tachycardia ([place])   . Back pain   . 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 Cataract   . CHF (congestive heart failure) ([place])   . CKD (chronic kidney disease), stage III ([place])   . Complete heart block (HCC)    a. s/p STJ dual chamber pacemaker 1999; upgrade to CRTP 2018  . Constipation   . COPD (chronic obstructive pulmonary disease) ([place])   . Diverticulosis   . Dyspnea   . Glaucoma   . HLD (hyperlipidemia)   . HTN (hypertension)   . Iritis   . Joint pain   . Leg edema   . NICM (nonischemic cardiomyopathy) ([place])    echo 1/11: EF 35-40%, mod LVH, Grade 1 diast dysfxn, mild BAE;    cath 2/06: luminal irregs;  Myoview 10/2:  no scar or  ischemia, EF 37%   . OSA (obstructive sleep apnea) 08/21/2016   "never RX'd mask" (04/20/2017)  . Pacemaker    SA node dysfunction; Dr. [name]  . Paroxysmal atrial fibrillation (HCC)   . Rheumatic fever   . Rheumatoid arthritis ([place])    Dr. Trudie Reed  . 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 Trudie Reed  . Stomach ulcer   . Stroke [facility]) ~ 2016   "mini-stroke"; denies residual on 04/20/2017)  . Syncope 05/2011   PRESYNCOPE  . Tuberculosis 1950s/1960s?    PAST SURGICAL HISTORY: Past Surgical History:  Procedure Laterality Date  . BIV UPGRADE N/A 04/20/2017   Procedure: BiV Upgrade;  Surgeon: Deboraha Sprang, MD;  Location: [facility];  Service: Cardiovascular;  Laterality: N/A;  . CARDIAC CATHETERIZATION    . CESAREAN SECTION    . colonoscopy  11/17/2017   polyps, repeat 5 years Dr. Therisa Doyne  . EYE SURGERY    . GLAUCOMA SURGERY Right   . INSERT / REPLACE / [place]; 12/26/13   STJ dual chamber pacemaker implanted 1999; gen change 12/2013 by Dr [name] - STJ   . PERMANENT PACEMAKER GENERATOR CHANGE N/A 01/02/2014   Procedure: PERMANENT PACEMAKER GENERATOR CHANGE;  Surgeon: Deboraha Sprang, MD;  Location: [facility] CATH LAB;  Service: Cardiovascular;  Laterality: N/A;  . TUBAL LIGATION    . VAGINAL HYSTERECTOMY      SOCIAL HISTORY: Social History   Tobacco Use  . Smoking status: Former Smoker    Packs/day: 0.50    Years: 8.00    Pack years: 4.00    Types: Cigarettes    Last attempt to quit: 10/06/1988    Years since quitting: 29.9  . Smokeless tobacco: Never Used  Substance Use Topics  . Alcohol use: No  . Drug use: No    FAMILY HISTORY: Family History  Problem Relation Age of Onset  . Heart disease Father   . Asthma Father   . Stroke Father   . Alcoholism Father   . Rheum arthritis Mother   . Stroke Mother   . Diabetes Sister   . Colon cancer Brother   . Colon cancer Brother   . Coronary artery disease Sister   . Coronary  artery disease Sister     ROS: Review of Systems  Constitutional: Negative for weight loss.  Endo/Heme/Allergies:       Negative for polyphagia    PHYSICAL EXAM: Blood pressure 119/76, pulse 80, temperature 98.1 F (36.7 C), temperature source Oral, height 5\' 3"  (1.6 m), weight 182 lb (82.6 kg), SpO2 96 %. Body mass  index is 32.24 kg/m. Physical Exam  Constitutional: She is oriented to person, place, and time. She appears well-developed and well-nourished.  HENT:  Head: Normocephalic.  Eyes: Pupils are equal, round, and reactive to light.  Neck: Normal range of motion.  Cardiovascular: Normal rate.  Pulmonary/Chest: Effort normal.  Musculoskeletal: Normal range of motion.  Neurological: She is alert and oriented to person, place, and time.  Skin: Skin is warm and dry.  Psychiatric: She has a normal mood and affect. Her behavior is normal.  Vitals reviewed.   RECENT LABS AND TESTS: BMET    Component Value Date/Time   NA 138 08/12/2018 0958   K 4.5 08/12/2018 0958   CL 100 08/12/2018 0958   CO2 23 08/12/2018 0958   GLUCOSE 80 08/12/2018 0958   GLUCOSE 74 03/16/2018 1827   BUN 24 08/12/2018 0958   CREATININE 2.32 (H) 08/12/2018 0958   CREATININE 1.82 (H) 03/24/2016 0806   CALCIUM 10.2 08/12/2018 0958   GFRNONAA 21 (L) 08/12/2018 0958   GFRAA 24 (L) 08/12/2018 0958   Lab Results  Component Value Date   HGBA1C 5.5 08/12/2018   Lab Results  Component Value Date   INSULIN 14.6 08/12/2018   CBC    Component Value Date/Time   WBC 3.8 08/12/2018 0958   WBC 3.6 (L) 03/16/2018 1827   RBC 3.69 (L) 08/12/2018 0958   RBC 3.63 (L) 03/16/2018 1827   HGB 11.1 08/12/2018 0958   HCT 33.5 (L) 08/12/2018 0958   PLT 170 03/16/2018 1827   PLT 225 04/13/2017 1054   MCV 91 08/12/2018 0958   MCH 30.1 08/12/2018 0958   MCH 29.5 03/16/2018 1827   MCHC 33.1 08/12/2018 0958   MCHC 31.5 03/16/2018 1827   RDW 12.4 08/12/2018 0958   LYMPHSABS 0.6 (L) 08/12/2018 0958   MONOABS  0.4 03/16/2018 1827   EOSABS 0.0 08/12/2018 0958   BASOSABS 0.0 08/12/2018 0958   Iron/TIBC/Ferritin/ %Sat    Component Value Date/Time   IRON 76 05/24/2011 0240   TIBC 336 05/24/2011 0240   FERRITIN 176 05/24/2011 0240   IRONPCTSAT 23 05/24/2011 0240   Lipid Panel     Component Value Date/Time   CHOL 223 (H) 08/12/2018 0958   TRIG 123 08/12/2018 0958   HDL 60 08/12/2018 0958   LDLCALC 138 (H) 08/12/2018 0958   Hepatic Function Panel     Component Value Date/Time   PROT 6.8 08/12/2018 0958   ALBUMIN 4.2 08/12/2018 0958   AST 26 08/12/2018 0958   ALT 15 08/12/2018 0958   ALKPHOS 77 08/12/2018 0958   BILITOT 0.5 08/12/2018 0958      Component Value Date/Time   TSH 3.020 08/12/2018 0958   TSH 2.540 02/18/2017 1043   TSH 2.271 05/24/2011 0240    ASSESSMENT AND PLAN: Insulin resistance  Class 1 obesity with serious comorbidity and body mass index (BMI) of 32.0 to 32.9 in adult, unspecified obesity type  PLAN: Insulin Resistance [name] will continue to work on weight loss, exercise, and decreasing simple carbohydrates in her diet to help decrease the risk of diabetes. We dicussed metformin including benefits and risks. She was informed that eating too many simple carbohydrates or too many calories at one sitting increases the likelihood of GI side effects. [name] agreed to follow up with [place] as directed to monitor her progress.  Obesity [name] is currently in the action stage of change. As such, her goal is to continue with weight loss efforts She has agreed to follow  the Category 1 plan with additional breakfast options and substitutions for eggs.  [name] has been instructed not to exercise yet.  We discussed the following Behavioral Modification Strategies today: increasing lean protein intake, celebration eating strategies, and planning for success.    [name] has agreed to follow up with our clinic in 2-3 weeks. She was informed of the importance of frequent  follow up visits to maximize her success with intensive lifestyle modifications for her multiple health conditions.   OBESITY BEHAVIORAL INTERVENTION VISIT  Today's visit was # 3   Starting weight: 183 lb Starting date: 08/12/18 Today's weight : Weight: 182 lb (82.6 kg)  Today's date: 09/13/2018 Total lbs lost to date: 1 lb At least 15 minutes were spent on discussing the following behavioral intervention visit.   ASK: We discussed the diagnosis of obesity with [name] today and [name] agreed to give [place] permission to discuss obesity behavioral modification therapy today.  ASSESS: [name] has the diagnosis of obesity and her BMI today is 32.25 [name] is in the action stage of change   ADVISE: [name] was educated on the multiple health risks of obesity as well as the benefit of weight loss to improve her health. She was advised of the need for long term treatment and the importance of lifestyle modifications to improve her current health and to decrease her risk of future health problems.  AGREE: Multiple dietary modification options and treatment options were discussed and  [name] agreed to follow the recommendations documented in the above note.  ARRANGE: [name] was educated on the importance of frequent visits to treat obesity as outlined per CMS and USPSTF guidelines and agreed to schedule her next follow up appointment today.  I, [name], am acting as Location manager for [employer], FNP-C.  I have reviewed the above documentation for accuracy and completeness, and I agree with the above.  - Brenda Sloss, FNP-C.

## 2018-09-14 ENCOUNTER — Encounter (INDEPENDENT_AMBULATORY_CARE_PROVIDER_SITE_OTHER): Payer: Self-pay | Admitting: Family Medicine

## 2018-09-27 ENCOUNTER — Ambulatory Visit (INDEPENDENT_AMBULATORY_CARE_PROVIDER_SITE_OTHER): Payer: Medicare Other

## 2018-09-27 DIAGNOSIS — Z950 Presence of cardiac pacemaker: Secondary | ICD-10-CM

## 2018-09-27 DIAGNOSIS — I5022 Chronic systolic (congestive) heart failure: Secondary | ICD-10-CM | POA: Diagnosis not present

## 2018-09-28 NOTE — Progress Notes (Signed)
EPIC Encounter for ICM Monitoring  Patient Name: [name] is a 72 y.o. female Date: 09/28/2018 Primary Care Physican: Brenda Ada, MD Primary Cardiologist:[name] Electrophysiologist:[name] Bi-V Pacing:>99% Last Weight: 190lbs Today's Weight:  unknown  AT/AF 3%                                                   Transmission reviewed.    Thoracic impedance normal.   Prescribed: Furosemide20 mg take 1 tablet daily  Labs: 08/12/2018 Creatinine 2.32, BUN 24, Potassium 4.5, Sodium 138, eGFR 21-24 03/16/2018 Creatinine 2.37, BUN 34, Potassium 4.5, Sodium 141, EGFR 20-23 04/13/2017 Creatinine 2.17, BUN 23, Potassium 4.8, Sodium 143  Recommendations: Unable to reach.  Follow-up plan: ICM clinic phone appointment on 11/15/2018.   Office visit 10/15/2018 with Dr [name].  Copy of ICM check sent to Dr. Caryl Kline.   3 month ICM trend: 09/28/2018    1 Year ICM trend:       Brenda Billings, RN 09/28/2018 7:56 AM

## 2018-09-30 ENCOUNTER — Other Ambulatory Visit: Payer: Self-pay | Admitting: Internal Medicine

## 2018-09-30 NOTE — Telephone Encounter (Signed)
Pt is a 72 yr old female who last saw NP on 10/14/17. Last noted SCr was 2.32 on 08/12/18. Weight on 09/13/18 was 82.6Kg. Will refill Eliquis 5mg  BID.

## 2018-10-04 ENCOUNTER — Ambulatory Visit (INDEPENDENT_AMBULATORY_CARE_PROVIDER_SITE_OTHER): Payer: Medicare Other | Admitting: Family Medicine

## 2018-10-04 ENCOUNTER — Encounter (INDEPENDENT_AMBULATORY_CARE_PROVIDER_SITE_OTHER): Payer: Self-pay | Admitting: Family Medicine

## 2018-10-04 VITALS — BP 112/79 | HR 83 | Temp 98.2°F | Ht 63.0 in | Wt 181.0 lb

## 2018-10-04 DIAGNOSIS — E88819 Insulin resistance, unspecified: Secondary | ICD-10-CM | POA: Insufficient documentation

## 2018-10-04 DIAGNOSIS — Z6832 Body mass index (BMI) 32.0-32.9, adult: Secondary | ICD-10-CM

## 2018-10-04 DIAGNOSIS — E669 Obesity, unspecified: Secondary | ICD-10-CM | POA: Diagnosis not present

## 2018-10-04 DIAGNOSIS — E8881 Metabolic syndrome: Secondary | ICD-10-CM

## 2018-10-04 NOTE — Progress Notes (Signed)
Office: [phone]  /  Fax: [phone]   HPI:   Chief Complaint: OBESITY [name] is here to discuss her progress with her obesity treatment plan. She is on the Category 1 plan with additional breakfast options and substitutions for eggs and is following her eating plan approximately 50 % of the time. She states she is exercising 0 minutes 0 times per week. [name] has been off the plan for the holidays but plans on getting back on track. Her weight goal is 165 lbs.  Her weight is 181 lb (82.1 kg) today and has gained 1 pound since her last visit. She has lost 2 lbs since starting treatment with [place].  Insulin Resistance [name] has a diagnosis of insulin resistance based on her elevated fasting insulin level >5. Although [place]'s blood glucose readings are still under good control, insulin resistance puts her at greater risk of metabolic syndrome and diabetes. She is not taking metformin currently and notes cravings at bedtime. She continues to work on diet and exercise to decrease risk of diabetes.   ASSESSMENT AND PLAN:  Insulin resistance  Class 1 obesity with serious comorbidity and body mass index (BMI) of 32.0 to 32.9 in adult, unspecified obesity type  PLAN:  Insulin Resistance [name] is to eat 100 calorie snacks at bedtime, and she will continue to work on weight loss, exercise, and decreasing simple carbohydrates in her diet to help decrease the risk of diabetes. [name] agrees to follow up with our clinic in 2 weeks as directed to monitor her progress.  Obesity [name] is currently in the action stage of change. As such, her goal is to continue with weight loss efforts She has agreed to follow the Category 1 plus breakfast options [name] has not been prescribed exercise at this time. We discussed the following Behavioral Modification Strategies today: work on meal planning and easy cooking plans, better snacking choices and meal planning for success 100 calorie snack  handout was provided. Her weight goal is 165 lbs which I believe is a reasonable goal for her. This will put her BMI at 29.  [name] has agreed to follow up with our clinic in 2 weeks. She was informed of the importance of frequent follow up visits to maximize her success with intensive lifestyle modifications for her multiple health conditions.  ALLERGIES: Allergies  Allergen Reactions  . Morphine And Related Anaphylaxis    Throat closing  . Codeine Itching  . Methotrexate Derivatives     Itchy rash   . Penicillins Itching    Has patient had a PCN reaction causing immediate rash, facial/tongue/throat swelling, SOB or lightheadedness with hypotension: No Has patient had a PCN reaction causing severe rash involving mucus membranes or skin necrosis: No Has patient had a PCN reaction that required hospitalization: No Has patient had a PCN reaction occurring within the last 10 years: No If all of the above answers are "NO", then may proceed with Cephalosporin use.     MEDICATIONS: Current Outpatient Medications on File Prior to Visit  Medication Sig Dispense Refill  . bimatoprost (LUMIGAN) 0.01 % SOLN Place 1 drop into the right eye at bedtime.     . carvedilol (COREG) 25 MG tablet TAKE 1 TABLET BY MOUTH TWICE DAILY WITH A MEAL (Patient taking differently: Take 12.5 mg by mouth 2 (two) times daily with a meal. ) 180 tablet 1  . cycloSPORINE (RESTASIS) 0.05 % ophthalmic emulsion Place 1 drop into the right eye 2 (two) times daily.     Marland Kitchen  ELIQUIS 5 MG TABS tablet TAKE 1 TABLET BY MOUTH TWICE DAILY 60 tablet 3  . ezetimibe (ZETIA) 10 MG tablet Take 10 mg by mouth daily at 2 PM.     . famotidine (PEPCID) 20 MG tablet TAKE 1 TABLET BY MOUTH TWICE DAILY (Patient taking differently: TAKE 1 TABLET BY MOUTH TWICE DAILY (in the morning & in the afternoon)) 60 tablet 6  . furosemide (LASIX) 20 MG tablet TAKE 1 TABLET BY MOUTH EVERY DAY FOR SWELLING AND SHORTNESS OF BREATH 90 tablet 1  .  hydroxychloroquine (PLAQUENIL) 200 MG tablet Take 400 mg by mouth daily.     Javier Docker Oil 300 MG CAPS Take by mouth.    . loteprednol (LOTEMAX) 0.5 % ophthalmic suspension Place 1 drop into both eyes 4 (four) times daily.     . Multiple Vitamins-Minerals (PRESERVISION/LUTEIN) CAPS Take 1 capsule by mouth 2 (two) times daily. In the morning & afternoon    . nitroGLYCERIN (NITROSTAT) 0.4 MG SL tablet Place 0.4 mg under the tongue every 5 (five) minutes as needed for chest pain. For chest pain     No current facility-administered medications on file prior to visit.     PAST MEDICAL HISTORY: Past Medical History:  Diagnosis Date  . Anemia   . Arthritis    "all over" (04/20/2017)  . Atrial tachycardia ([place])   . Back pain   . Cardiomyopathy ([place])    due to sarcoid  . Cataract   . CHF (congestive heart failure) ([place])   . CKD (chronic kidney disease), stage III ([place])   . Complete heart block (HCC)    a. s/p STJ dual chamber pacemaker 1999; upgrade to CRTP 2018  . Constipation   . COPD (chronic obstructive pulmonary disease) ([place])   . Diverticulosis   . Dyspnea   . Glaucoma   . HLD (hyperlipidemia)   . HTN (hypertension)   . Iritis   . Joint pain   . Leg edema   . NICM (nonischemic cardiomyopathy) ([place])    echo 1/11: EF 35-40%, mod LVH, Grade 1 diast dysfxn, mild BAE;    cath 2/06: luminal irregs;  Myoview 10/2:  no scar or ischemia, EF 37%   . OSA (obstructive sleep apnea) 08/21/2016   "never RX'd mask" (04/20/2017)  . Pacemaker    SA node dysfunction; Dr. [name]  . Paroxysmal atrial fibrillation (HCC)   . Rheumatic fever   . Rheumatoid arthritis ([place])    Dr. Trudie Reed  . 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 Trudie Reed  . Stomach ulcer   . Stroke [facility]) ~ 2016   "mini-stroke"; denies residual on 04/20/2017)  . Syncope 05/2011   PRESYNCOPE  . Tuberculosis 1950s/1960s?    PAST SURGICAL HISTORY: Past Surgical History:  Procedure Laterality Date    . BIV UPGRADE N/A 04/20/2017   Procedure: BiV Upgrade;  Surgeon: Deboraha Sprang, MD;  Location: [facility];  Service: Cardiovascular;  Laterality: N/A;  . CARDIAC CATHETERIZATION    . CESAREAN SECTION    . colonoscopy  11/17/2017   polyps, repeat 5 years Dr. Therisa Doyne  . EYE SURGERY    . GLAUCOMA SURGERY Right   . INSERT / REPLACE / [place]; 12/26/13   STJ dual chamber pacemaker implanted 1999; gen change 12/2013 by Dr [name] - STJ   . PERMANENT PACEMAKER GENERATOR CHANGE N/A 01/02/2014   Procedure: PERMANENT PACEMAKER GENERATOR CHANGE;  Surgeon: Deboraha Sprang,  MD;  Location: [facility] CATH LAB;  Service: Cardiovascular;  Laterality: N/A;  . TUBAL LIGATION    . VAGINAL HYSTERECTOMY      SOCIAL HISTORY: Social History   Tobacco Use  . Smoking status: Former Smoker    Packs/day: 0.50    Years: 8.00    Pack years: 4.00    Types: Cigarettes    Last attempt to quit: 10/06/1988    Years since quitting: 30.0  . Smokeless tobacco: Never Used  Substance Use Topics  . Alcohol use: No  . Drug use: No    FAMILY HISTORY: Family History  Problem Relation Age of Onset  . Heart disease Father   . Asthma Father   . Stroke Father   . Alcoholism Father   . Rheum arthritis Mother   . Stroke Mother   . Diabetes Sister   . Colon cancer Brother   . Colon cancer Brother   . Coronary artery disease Sister   . Coronary artery disease Sister     ROS: Review of Systems  Constitutional: Negative for weight loss.  Endo/Heme/Allergies:       Negative for polyphagia    PHYSICAL EXAM: Blood pressure 112/79, pulse 83, temperature 98.2 F (36.8 C), temperature source Oral, height 5\' 3"  (1.6 m), weight 181 lb (82.1 kg), SpO2 96 %. Body mass index is 32.06 kg/m. Physical Exam Vitals signs reviewed.  Constitutional:      Appearance: Normal appearance. She is obese.  Cardiovascular:     Rate and Rhythm: Normal rate.     Pulses: Normal pulses.  Pulmonary:     Effort: Pulmonary  effort is normal.  Musculoskeletal: Normal range of motion.  Skin:    General: Skin is warm and dry.  Neurological:     Mental Status: She is alert and oriented to person, place, and time.  Psychiatric:        Mood and Affect: Mood normal.        Behavior: Behavior normal.     RECENT LABS AND TESTS: BMET    Component Value Date/Time   NA 138 08/12/2018 0958   K 4.5 08/12/2018 0958   CL 100 08/12/2018 0958   CO2 23 08/12/2018 0958   GLUCOSE 80 08/12/2018 0958   GLUCOSE 74 03/16/2018 1827   BUN 24 08/12/2018 0958   CREATININE 2.32 (H) 08/12/2018 0958   CREATININE 1.82 (H) 03/24/2016 0806   CALCIUM 10.2 08/12/2018 0958   GFRNONAA 21 (L) 08/12/2018 0958   GFRAA 24 (L) 08/12/2018 0958   Lab Results  Component Value Date   HGBA1C 5.5 08/12/2018   Lab Results  Component Value Date   INSULIN 14.6 08/12/2018   CBC    Component Value Date/Time   WBC 3.8 08/12/2018 0958   WBC 3.6 (L) 03/16/2018 1827   RBC 3.69 (L) 08/12/2018 0958   RBC 3.63 (L) 03/16/2018 1827   HGB 11.1 08/12/2018 0958   HCT 33.5 (L) 08/12/2018 0958   PLT 170 03/16/2018 1827   PLT 225 04/13/2017 1054   MCV 91 08/12/2018 0958   MCH 30.1 08/12/2018 0958   MCH 29.5 03/16/2018 1827   MCHC 33.1 08/12/2018 0958   MCHC 31.5 03/16/2018 1827   RDW 12.4 08/12/2018 0958   LYMPHSABS 0.6 (L) 08/12/2018 0958   MONOABS 0.4 03/16/2018 1827   EOSABS 0.0 08/12/2018 0958   BASOSABS 0.0 08/12/2018 0958   Iron/TIBC/Ferritin/ %Sat    Component Value Date/Time   IRON 76 05/24/2011 0240   TIBC 336  05/24/2011 0240   FERRITIN 176 05/24/2011 0240   IRONPCTSAT 23 05/24/2011 0240   Lipid Panel     Component Value Date/Time   CHOL 223 (H) 08/12/2018 0958   TRIG 123 08/12/2018 0958   HDL 60 08/12/2018 0958   LDLCALC 138 (H) 08/12/2018 0958   Hepatic Function Panel     Component Value Date/Time   PROT 6.8 08/12/2018 0958   ALBUMIN 4.2 08/12/2018 0958   AST 26 08/12/2018 0958   ALT 15 08/12/2018 0958   ALKPHOS  77 08/12/2018 0958   BILITOT 0.5 08/12/2018 0958      Component Value Date/Time   TSH 3.020 08/12/2018 0958   TSH 2.540 02/18/2017 1043   TSH 2.271 05/24/2011 0240      OBESITY BEHAVIORAL INTERVENTION VISIT  Today's visit was # 4   Starting weight: 183 lbs Starting date: 08/12/18 Today's weight :181 lbs Today's date: 10/04/2018 Total lbs lost to date: 2 At least 15 minutes were spent on discussing the following behavioral intervention visit.  ASK: We discussed the diagnosis of obesity with [name] today and [name] agreed to give [place] permission to discuss obesity behavioral modification therapy today.  ASSESS: [name] has the diagnosis of obesity and her BMI today is 32.07 [name] is in the action stage of change   ADVISE: [name] was educated on the multiple health risks of obesity as well as the benefit of weight loss to improve her health. She was advised of the need for long term treatment and the importance of lifestyle modifications to improve her current health and to decrease her risk of future health problems.  AGREE: Multiple dietary modification options and treatment options were discussed and  [name] agreed to follow the recommendations documented in the above note.  ARRANGE: [name] was educated on the importance of frequent visits to treat obesity as outlined per CMS and USPSTF guidelines and agreed to schedule her next follow up appointment today.  I, Tammy Wysor, am acting as Location manager for [employer], FNP-C.  I have reviewed the above documentation for accuracy and completeness, and I agree with the above.  - Reymundo Winship, FNP-C.

## 2018-10-11 ENCOUNTER — Ambulatory Visit (INDEPENDENT_AMBULATORY_CARE_PROVIDER_SITE_OTHER): Payer: Medicare Other | Admitting: Pulmonary Disease

## 2018-10-11 ENCOUNTER — Encounter: Payer: Self-pay | Admitting: Pulmonary Disease

## 2018-10-11 VITALS — BP 120/70 | HR 83 | Ht 63.0 in | Wt 189.4 lb

## 2018-10-11 DIAGNOSIS — G4733 Obstructive sleep apnea (adult) (pediatric): Secondary | ICD-10-CM

## 2018-10-11 NOTE — Patient Instructions (Signed)
Will arrange for home sleep study  Ask you dentist about whether you could be a candidate for an oral appliance to treat obstructive sleep apnea  Follow up in 6 months

## 2018-10-11 NOTE — Progress Notes (Signed)
[facility]  Chief Complaint  Patient presents with  . Follow-up    not wearing cpap. no new concerns today.     Constitutional:  BP 120/70 (BP Location: Left Arm, Cuff Size: Normal)   Pulse 83   Ht 5\' 3"  (1.6 m)   Wt 189 lb 6.4 oz (85.9 kg)   SpO2 96%   BMI 33.55 kg/m   Past Medical History:  TB, Syncope, PUD, RA, PAF, Complete heart block s/p PM, non ischemic CM, HTN, HLD, Glaucoma, Diverticulosis, Constipation, CKD 3, Cataract, Back pain, anemia  Brief Summary:  [name] is a 73 y.o. female former smoker upper airway cough syndrome, sarcoidosis with cardiac involvement, and obstructive sleep apnea.  She is no longer using CPAP.  Had trouble with using mask.  Developed a rash.  She hasn't noticed any difference with her sleep off of CPAP.  Physical Exam:   Appearance - well kempt   ENMT - clear nasal mucosa, midline nasal  septum, no oral exudates, no LAN, trachea midline  Respiratory - normal chest wall, normal respiratory effort, no accessory muscle use, no wheeze/rales  CV - s1s2 regular rate and rhythm, no murmurs, no peripheral edema, radial pulses symmetric  GI - soft, non tender, no masses  Lymph - no adenopathy noted in neck and axillary areas  MSK - normal gait  Ext - no cyanosis, clubbing, or joint inflammation noted  Skin - no rashes, lesions, or ulcers  Neuro - normal strength, oriented x 3  Psych - normal mood and affect   Assessment/Plan:   Obstructive sleep apnea. - she is no longer using CPAP - she feels her sleep is better - will arrange for home sleep study to assess current status of sleep apnea - if she still has significant sleep apnea, then she might be a candidate for an oral appliance >> advised her to d/w her dentist  Upper airway cough syndrome. - prn antihistamine  Systemic sarcoidosis with cardiac involvement. - plaquenil per Dr. Trudie Reed with rheumatology - f/u with  cardiology   Patient Instructions  Will arrange for home sleep study  Ask you dentist about whether you could be a candidate for an oral appliance to treat obstructive sleep apnea  Follow up in 6 months    [name], MD [place] Pager: [phone] 10/11/2018, 5:13 PM  Flow Sheet     Pulmonary tests:  Lung biopsy 1984 >> consistent with sarcoidosis PFT 02/13/12>>FEV1 1.03 (49%), FEV1% 76, TLC 3.30 (71%), DLCO 49%, positive BD response.  05/13/13PPD negative  07/24/13Start MTX >>stopped November 2013 due to pruritis PFT 08/10/12>>FEV1 1.08 (52%), FEV1% 70, TLC 2.77 (59%), DLCO 44%, no BD PFT 04/21/13>>FEV1 1.05 (55%), FEV1% 75, TLC 2.70 (55%), DLCO 63%, no BD   Sleep tests:  PSG 06/19/14 >> AHI 9.1, SpO2 low 74% HST 08/20/16 >> AHI 13.6, SaO2 low 75% HST 05/29/17 >> AHI 16.8, SaO2 low 75% Auto CPAP 09/19/17 to 10/18/17 >> used on 30 of 30 nights with average 6 hrs 19 min.  Average AHI 1.4 with median CPAP 10 and 95 th percentile CPAP 13 cm H2O  Cardiac tests:  Echo 11/04/17 >> EF 40 to 45%, severe LVH   Review of Systems:  CT chest 01/21/12>>b/l hilar and mediastinal nodes, diffuse peribronchovascular and perifissural nodularity with mild associated septal thickening. CT chest 08/06/12>>mediastinal/hilar LAN, diffuse peribronchovascular and perfissural nodularity  Medications:   Allergies as of 10/11/2018      Reactions  Morphine And Related Anaphylaxis   Throat closing   Codeine Itching   Methotrexate Derivatives    Itchy rash   Penicillins Itching   Has patient had a PCN reaction causing immediate rash, facial/tongue/throat swelling, SOB or lightheadedness with hypotension: No Has patient had a PCN reaction causing severe rash involving mucus membranes or skin necrosis: No Has patient had a PCN reaction that required hospitalization: No Has patient had a PCN reaction occurring within the last 10 years: No If all of the above answers are  "NO", then may proceed with Cephalosporin use.      Medication List       Accurate as of October 11, 2018  5:13 PM. Always use your most recent med list.        carvedilol 25 MG tablet Commonly known as:  COREG TAKE 1 TABLET BY MOUTH TWICE DAILY WITH A MEAL   cycloSPORINE 0.05 % ophthalmic emulsion Commonly known as:  RESTASIS Place 1 drop into the right eye 2 (two) times daily.   ELIQUIS 5 MG Tabs tablet Generic drug:  apixaban TAKE 1 TABLET BY MOUTH TWICE DAILY   famotidine 20 MG tablet Commonly known as:  PEPCID TAKE 1 TABLET BY MOUTH TWICE DAILY   furosemide 20 MG tablet Commonly known as:  LASIX TAKE 1 TABLET BY MOUTH EVERY DAY FOR SWELLING AND SHORTNESS OF BREATH   hydroxychloroquine 200 MG tablet Commonly known as:  PLAQUENIL Take 400 mg by mouth daily.   Krill Oil 300 MG Caps Take by mouth.   LOTEMAX 0.5 % ophthalmic suspension Generic drug:  loteprednol Place 1 drop into both eyes 4 (four) times daily.   LUMIGAN 0.01 % Soln Generic drug:  bimatoprost Place 1 drop into the right eye at bedtime.   nitroGLYCERIN 0.4 MG SL tablet Commonly known as:  NITROSTAT Place 0.4 mg under the tongue every 5 (five) minutes as needed for chest pain. For chest pain   PRESERVISION/LUTEIN Caps Take 1 capsule by mouth 2 (two) times daily. In the morning & afternoon   ZETIA 10 MG tablet Generic drug:  ezetimibe Take 10 mg by mouth daily at 2 PM.       Past Surgical History:  She  has a past surgical history that includes Tubal ligation; Permanent pacemaker generator change (N/A, 01/02/2014); Insert / replace / remove pacemaker (1999; 12/26/13); Glaucoma surgery (Right); Eye surgery; Vaginal hysterectomy; Cardiac catheterization; BIV UPGRADE (N/A, 04/20/2017); colonoscopy (11/17/2017); and Cesarean section.  Family History:  Her family history includes Alcoholism in her father; Asthma in her father; Colon cancer in her brother and brother; Coronary artery disease in her  sister and sister; Diabetes in her sister; Heart disease in her father; Rheum arthritis in her mother; Stroke in her father and mother.  Social History:  She  reports that she quit smoking about 30 years ago. Her smoking use included cigarettes. She has a 4.00 pack-year smoking history. She has never used smokeless tobacco. She reports that she does not drink alcohol or use drugs.

## 2018-10-15 ENCOUNTER — Ambulatory Visit (INDEPENDENT_AMBULATORY_CARE_PROVIDER_SITE_OTHER): Payer: Medicare Other | Admitting: Internal Medicine

## 2018-10-15 ENCOUNTER — Encounter: Payer: Self-pay | Admitting: Internal Medicine

## 2018-10-15 VITALS — BP 110/78 | HR 83 | Ht 63.0 in | Wt 186.6 lb

## 2018-10-15 DIAGNOSIS — Z950 Presence of cardiac pacemaker: Secondary | ICD-10-CM | POA: Diagnosis not present

## 2018-10-15 DIAGNOSIS — I428 Other cardiomyopathies: Secondary | ICD-10-CM | POA: Diagnosis not present

## 2018-10-15 DIAGNOSIS — I480 Paroxysmal atrial fibrillation: Secondary | ICD-10-CM | POA: Diagnosis not present

## 2018-10-15 DIAGNOSIS — I442 Atrioventricular block, complete: Secondary | ICD-10-CM

## 2018-10-15 DIAGNOSIS — I495 Sick sinus syndrome: Secondary | ICD-10-CM

## 2018-10-15 NOTE — H&P (View-Only) (Signed)
Patient Care Team: Carol Ada, MD as PCP - General (Family Medicine) Deboraha Sprang, MD as PCP - Cardiology (Cardiology)   HPI  [name] is a 73 y.o. 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  She also has a history of atrial arrhythmias and a TIA for which she takes apixoban,  SCAF was noted 5/17. Episode duration was 12 hours or so.  Because of ongoing problems with congestive heart failure she underwent CRT upgrade 7/18.   DATE TEST EF   4/13 Echo  35-40 %   3/15 Echo 40-45 %   5/18 Echo 25-30%   1/19 Echo 40-45%     Date Cr K Mg Hgb  6/15  2.3 4.5     6/17  1.82* 5.2     4/18  1.91 5.1 2.3 10.8  7/18 2.17 4.8    11/19 2.32 4.5  11.1    Above labs obtained and personally reviewed  She has noted worsening exercise intolerance over the last couple of weeks.  No associated edema.  No chest pain or palpitations  No bleeding.     Past Medical History:  Diagnosis Date  . Anemia   . Arthritis    "all over" (04/20/2017)  . Atrial tachycardia ([place])   . Back pain   . Cardiomyopathy ([place])    due to sarcoid  . Cataract   . CHF (congestive heart failure) ([place])   . CKD (chronic kidney disease), stage III ([place])   . Complete heart block (HCC)    a. s/p STJ dual chamber pacemaker 1999; upgrade to CRTP 2018  . Constipation   . COPD (chronic obstructive pulmonary disease) ([place])   . Diverticulosis   . Dyspnea   . Glaucoma   . HLD (hyperlipidemia)   . HTN (hypertension)   . Iritis   . Joint pain   . Leg edema   . NICM (nonischemic cardiomyopathy) ([place])    echo 1/11: EF 35-40%, mod LVH, Grade 1 diast dysfxn, mild BAE;    cath 2/06: luminal irregs;  Myoview 10/2:  no scar or ischemia, EF 37%   . OSA (obstructive sleep apnea) 08/21/2016   "never RX'd mask" (04/20/2017)  . Pacemaker    SA node dysfunction; Dr. [name]  . Paroxysmal atrial fibrillation (HCC)   . Rheumatic  fever   . Rheumatoid arthritis ([place])    Dr. Trudie Reed  . 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 Trudie Reed  . Stomach ulcer   . Stroke [facility]) ~ 2016   "mini-stroke"; denies residual on 04/20/2017)  . Syncope 05/2011   PRESYNCOPE  . Tuberculosis 1950s/1960s?    Past Surgical History:  Procedure Laterality Date  . BIV UPGRADE N/A 04/20/2017   Procedure: BiV Upgrade;  Surgeon: Deboraha Sprang, MD;  Location: [facility];  Service: Cardiovascular;  Laterality: N/A;  . CARDIAC CATHETERIZATION    . CESAREAN SECTION    . colonoscopy  11/17/2017   polyps, repeat 5 years Dr. Therisa Doyne  . EYE SURGERY    . GLAUCOMA SURGERY Right   . INSERT / REPLACE / [facility]; 12/26/13   STJ dual chamber pacemaker implanted 1999; gen change 12/2013 by Dr [name] - STJ   . PERMANENT PACEMAKER GENERATOR CHANGE N/A 01/02/2014   Procedure: PERMANENT PACEMAKER GENERATOR CHANGE;  Surgeon: Deboraha Sprang, MD;  Location: [facility];  Service: Cardiovascular;  Laterality: N/A;  . TUBAL LIGATION    . VAGINAL HYSTERECTOMY      Current Outpatient Medications  Medication Sig Dispense Refill  . bimatoprost (LUMIGAN) 0.01 % SOLN Place 1 drop into the right eye at bedtime.     . carvedilol (COREG) 25 MG tablet TAKE 1 TABLET BY MOUTH TWICE DAILY WITH A MEAL 180 tablet 1  . cycloSPORINE (RESTASIS) 0.05 % ophthalmic emulsion Place 1 drop into the right eye 2 (two) times daily.     Marland Kitchen ELIQUIS 5 MG TABS tablet TAKE 1 TABLET BY MOUTH TWICE DAILY 60 tablet 3  . ezetimibe (ZETIA) 10 MG tablet Take 10 mg by mouth daily at 2 PM.     . famotidine (PEPCID) 20 MG tablet TAKE 1 TABLET BY MOUTH TWICE DAILY 60 tablet 6  . furosemide (LASIX) 20 MG tablet TAKE 1 TABLET BY MOUTH EVERY DAY FOR SWELLING AND SHORTNESS OF BREATH 90 tablet 1  . hydroxychloroquine (PLAQUENIL) 200 MG tablet Take 400 mg by mouth daily.     Javier Docker Oil 300 MG CAPS Take 1 capsule by mouth daily.     Marland Kitchen loteprednol (LOTEMAX)  0.5 % ophthalmic suspension Place 1 drop into both eyes 4 (four) times daily.     . Multiple Vitamins-Minerals (PRESERVISION/LUTEIN) CAPS Take 1 capsule by mouth 2 (two) times daily. In the morning & afternoon    . nitroGLYCERIN (NITROSTAT) 0.4 MG SL tablet Place 0.4 mg under the tongue every 5 (five) minutes as needed for chest pain. For chest pain     No current facility-administered medications for this visit.     Allergies  Allergen Reactions  . Morphine And Related Anaphylaxis    Throat closing  . Codeine Itching  . Methotrexate Derivatives     Itchy rash   . Penicillins Itching    Has patient had a PCN reaction causing immediate rash, facial/tongue/throat swelling, SOB or lightheadedness with hypotension: No Has patient had a PCN reaction causing severe rash involving mucus membranes or skin necrosis: No Has patient had a PCN reaction that required hospitalization: No Has patient had a PCN reaction occurring within the last 10 years: No If all of the above answers are "NO", then may proceed with Cephalosporin use.     Review of Systems negative except from HPI and PMH  Physical Exam BP 110/78   Pulse 83   Ht 5\' 3"  (1.6 m)   Wt 186 lb 9.6 oz (84.6 kg)   SpO2 95%   BMI 33.05 kg/m  Well developed and nourished in no acute distress HENT normal Neck supple with JVP-flat Clear Regular rate and rhythm, no murmurs or gallops Abd-soft with active BS No Clubbing cyanosis edema Skin-warm and dry A & Oriented  Grossly normal sensory and motor function     ECG demonstrates  Atrial flutter 360 msec with P-synchronous/ AV  pacing  Intervals 23/16/43 with an upright QRS in V1 and negative QRS lead I  Assessment and  Plan  Nonischemic cardiomyopathy   Sarcoid heart disease   Atrial fibrillation //SCAF   Atrial tachycardia-cycle length 360 ms  Hypertension  Complete heart block   Renal Insufficiency  Grade 4   Sinus node dysfunction  Congestive Heart failure  acute chronic 3  GI bleeding    Pacemaker  St Jude      On Anticoagulation;  No bleeding issues   No intercurrent atrial fibrillation or flutter  Atrial tachycardia occurs  below the mode switch cut off rate.  We will plan to reprogram this following cardioversion.  We attempted to pace terminate the tachycardia today with cycle length down to 160 ms without success.  She will be cardioverted next week.  Euvolemic continue current meds  More than 50% of 40 min was spent in counseling related to the above

## 2018-10-15 NOTE — Progress Notes (Signed)
Patient Care Team: Carol Ada, MD as PCP - General (Family Medicine) Deboraha Sprang, MD as PCP - Cardiology (Cardiology)   HPI  [name] is a 73 y.o. 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  She also has a history of atrial arrhythmias and a TIA for which she takes apixoban,  SCAF was noted 5/17. Episode duration was 12 hours or so.  Because of ongoing problems with congestive heart failure she underwent CRT upgrade 7/18.   DATE TEST EF   4/13 Echo  35-40 %   3/15 Echo 40-45 %   5/18 Echo 25-30%   1/19 Echo 40-45%     Date Cr K Mg Hgb  6/15  2.3 4.5     6/17  1.82* 5.2     4/18  1.91 5.1 2.3 10.8  7/18 2.17 4.8    11/19 2.32 4.5  11.1    Above labs obtained and personally reviewed  She has noted worsening exercise intolerance over the last couple of weeks.  No associated edema.  No chest pain or palpitations  No bleeding.     Past Medical History:  Diagnosis Date  . Anemia   . Arthritis    "all over" (04/20/2017)  . Atrial tachycardia ([place])   . Back pain   . Cardiomyopathy ([place])    due to sarcoid  . Cataract   . CHF (congestive heart failure) ([place])   . CKD (chronic kidney disease), stage III ([place])   . Complete heart block (HCC)    a. s/p STJ dual chamber pacemaker 1999; upgrade to CRTP 2018  . Constipation   . COPD (chronic obstructive pulmonary disease) ([place])   . Diverticulosis   . Dyspnea   . Glaucoma   . HLD (hyperlipidemia)   . HTN (hypertension)   . Iritis   . Joint pain   . Leg edema   . NICM (nonischemic cardiomyopathy) ([place])    echo 1/11: EF 35-40%, mod LVH, Grade 1 diast dysfxn, mild BAE;    cath 2/06: luminal irregs;  Myoview 10/2:  no scar or ischemia, EF 37%   . OSA (obstructive sleep apnea) 08/21/2016   "never RX'd mask" (04/20/2017)  . Pacemaker    SA node dysfunction; Dr. [name]  . Paroxysmal atrial fibrillation (HCC)   . Rheumatic  fever   . Rheumatoid arthritis ([place])    Dr. Trudie Reed  . 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 Trudie Reed  . Stomach ulcer   . Stroke [facility]) ~ 2016   "mini-stroke"; denies residual on 04/20/2017)  . Syncope 05/2011   PRESYNCOPE  . Tuberculosis 1950s/1960s?    Past Surgical History:  Procedure Laterality Date  . BIV UPGRADE N/A 04/20/2017   Procedure: BiV Upgrade;  Surgeon: Deboraha Sprang, MD;  Location: [facility];  Service: Cardiovascular;  Laterality: N/A;  . CARDIAC CATHETERIZATION    . CESAREAN SECTION    . colonoscopy  11/17/2017   polyps, repeat 5 years Dr. Therisa Doyne  . EYE SURGERY    . GLAUCOMA SURGERY Right   . INSERT / REPLACE / [place]; 12/26/13   STJ dual chamber pacemaker implanted 1999; gen change 12/2013 by Dr [name] - STJ   . PERMANENT PACEMAKER GENERATOR CHANGE N/A 01/02/2014   Procedure: PERMANENT PACEMAKER GENERATOR CHANGE;  Surgeon: Deboraha Sprang, MD;  Location: [facility];  Service: Cardiovascular;  Laterality: N/A;  . TUBAL LIGATION    . VAGINAL HYSTERECTOMY      Current Outpatient Medications  Medication Sig Dispense Refill  . bimatoprost (LUMIGAN) 0.01 % SOLN Place 1 drop into the right eye at bedtime.     . carvedilol (COREG) 25 MG tablet TAKE 1 TABLET BY MOUTH TWICE DAILY WITH A MEAL 180 tablet 1  . cycloSPORINE (RESTASIS) 0.05 % ophthalmic emulsion Place 1 drop into the right eye 2 (two) times daily.     Marland Kitchen ELIQUIS 5 MG TABS tablet TAKE 1 TABLET BY MOUTH TWICE DAILY 60 tablet 3  . ezetimibe (ZETIA) 10 MG tablet Take 10 mg by mouth daily at 2 PM.     . famotidine (PEPCID) 20 MG tablet TAKE 1 TABLET BY MOUTH TWICE DAILY 60 tablet 6  . furosemide (LASIX) 20 MG tablet TAKE 1 TABLET BY MOUTH EVERY DAY FOR SWELLING AND SHORTNESS OF BREATH 90 tablet 1  . hydroxychloroquine (PLAQUENIL) 200 MG tablet Take 400 mg by mouth daily.     Javier Docker Oil 300 MG CAPS Take 1 capsule by mouth daily.     Marland Kitchen loteprednol (LOTEMAX)  0.5 % ophthalmic suspension Place 1 drop into both eyes 4 (four) times daily.     . Multiple Vitamins-Minerals (PRESERVISION/LUTEIN) CAPS Take 1 capsule by mouth 2 (two) times daily. In the morning & afternoon    . nitroGLYCERIN (NITROSTAT) 0.4 MG SL tablet Place 0.4 mg under the tongue every 5 (five) minutes as needed for chest pain. For chest pain     No current facility-administered medications for this visit.     Allergies  Allergen Reactions  . Morphine And Related Anaphylaxis    Throat closing  . Codeine Itching  . Methotrexate Derivatives     Itchy rash   . Penicillins Itching    Has patient had a PCN reaction causing immediate rash, facial/tongue/throat swelling, SOB or lightheadedness with hypotension: No Has patient had a PCN reaction causing severe rash involving mucus membranes or skin necrosis: No Has patient had a PCN reaction that required hospitalization: No Has patient had a PCN reaction occurring within the last 10 years: No If all of the above answers are "NO", then may proceed with Cephalosporin use.     Review of Systems negative except from HPI and PMH  Physical Exam BP 110/78   Pulse 83   Ht 5\' 3"  (1.6 m)   Wt 186 lb 9.6 oz (84.6 kg)   SpO2 95%   BMI 33.05 kg/m  Well developed and nourished in no acute distress HENT normal Neck supple with JVP-flat Clear Regular rate and rhythm, no murmurs or gallops Abd-soft with active BS No Clubbing cyanosis edema Skin-warm and dry A & Oriented  Grossly normal sensory and motor function     ECG demonstrates  Atrial flutter 360 msec with P-synchronous/ AV  pacing  Intervals 23/16/43 with an upright QRS in V1 and negative QRS lead I  Assessment and  Plan  Nonischemic cardiomyopathy   Sarcoid heart disease   Atrial fibrillation //SCAF   Atrial tachycardia-cycle length 360 ms  Hypertension  Complete heart block   Renal Insufficiency  Grade 4   Sinus node dysfunction  Congestive Heart failure  acute chronic 3  GI bleeding    Pacemaker  St Jude      On Anticoagulation;  No bleeding issues   No intercurrent atrial fibrillation or flutter  Atrial tachycardia occurs  below the mode switch cut off rate.  We will plan to reprogram this following cardioversion.  We attempted to pace terminate the tachycardia today with cycle length down to 160 ms without success.  She will be cardioverted next week.  Euvolemic continue current meds  More than 50% of 40 min was spent in counseling related to the above

## 2018-10-15 NOTE — Patient Instructions (Addendum)
Medication Instructions:  Your physician recommends that you continue on your current medications as directed. Please refer to the Current Medication list given to you today.  Labwork: You will have labs drawn today: CBC and BMP   Testing/Procedures: Your physician has recommended that you have a Cardioversion (DCCV). Electrical Cardioversion uses a jolt of electricity to your heart either through paddles or wired patches attached to your chest. This is a controlled, usually prescheduled, procedure. Defibrillation is done under light anesthesia in the hospital, and you usually go home the day of the procedure. This is done to get your heart back into a normal rhythm. You are not awake for the procedure. Please see the instruction sheet given to you today.   Follow-Up: Your physician recommends that you schedule a follow-up appointment in:   4-5 weeks with an EP APP for a post cardioversion follow up.  Any Other Special Instructions Will Be Listed Below (If Applicable).   Please arrive at the [facility] main entrance of [facility] hospital at 6:00am. Proceed to patient registration and they will take you to short stay.  Do not eat or drink after midnight the night before your prior to procedure Take your Eliquis the morning of your procedure. Do not skip a dose. You may hold your remaining medications the morning of the procedure  You will need someone to drive you home at discharge   If you need a refill on your cardiac medications before your next appointment, please call your pharmacy.

## 2018-10-16 LAB — BASIC METABOLIC PANEL
BUN/Creatinine Ratio: 16 (ref 12–28)
BUN: 36 mg/dL — AB (ref 8–27)
CHLORIDE: 104 mmol/L (ref 96–106)
CO2: 23 mmol/L (ref 20–29)
CREATININE: 2.26 mg/dL — AB (ref 0.57–1.00)
Calcium: 10.1 mg/dL (ref 8.7–10.3)
GFR calc non Af Amer: 21 mL/min/{1.73_m2} — ABNORMAL LOW (ref >59)
GFR, EST AFRICAN AMERICAN: 25 mL/min/{1.73_m2} — AB (ref >59)
Glucose: 84 mg/dL (ref 65–99)
Potassium: 4.6 mmol/L (ref 3.5–5.2)
Sodium: 143 mmol/L (ref 134–144)

## 2018-10-16 LAB — CBC
Hematocrit: 34.4 % (ref 34.0–46.6)
Hemoglobin: 11.3 g/dL (ref 11.1–15.9)
MCH: 29.8 pg (ref 26.6–33.0)
MCHC: 32.8 g/dL (ref 31.5–35.7)
MCV: 91 fL (ref 79–97)
PLATELETS: 199 10*3/uL (ref 150–450)
RBC: 3.79 x10E6/uL (ref 3.77–5.28)
RDW: 12.4 % (ref 11.7–15.4)
WBC: 4.1 10*3/uL (ref 3.4–10.8)

## 2018-10-18 ENCOUNTER — Ambulatory Visit (HOSPITAL_COMMUNITY)
Admission: RE | Admit: 2018-10-18 | Discharge: 2018-10-18 | Disposition: A | Discharge status: Home / Self Care | Payer: Medicare Other | Attending: Internal Medicine | Admitting: Internal Medicine

## 2018-10-18 ENCOUNTER — Other Ambulatory Visit: Payer: Self-pay

## 2018-10-18 ENCOUNTER — Encounter (HOSPITAL_COMMUNITY): Payer: Self-pay | Admitting: Internal Medicine

## 2018-10-18 ENCOUNTER — Encounter (HOSPITAL_COMMUNITY)
Admission: RE | Disposition: A | Discharge status: Home / Self Care | Payer: Self-pay | Source: Home / Self Care | Attending: Internal Medicine

## 2018-10-18 DIAGNOSIS — Z79899 Other long term (current) drug therapy: Secondary | ICD-10-CM | POA: Insufficient documentation

## 2018-10-18 DIAGNOSIS — Z8673 Personal history of transient ischemic attack (TIA), and cerebral infarction without residual deficits: Secondary | ICD-10-CM | POA: Diagnosis not present

## 2018-10-18 DIAGNOSIS — I509 Heart failure, unspecified: Secondary | ICD-10-CM | POA: Diagnosis not present

## 2018-10-18 DIAGNOSIS — G4733 Obstructive sleep apnea (adult) (pediatric): Secondary | ICD-10-CM | POA: Diagnosis not present

## 2018-10-18 DIAGNOSIS — D869 Sarcoidosis, unspecified: Secondary | ICD-10-CM | POA: Diagnosis not present

## 2018-10-18 DIAGNOSIS — E785 Hyperlipidemia, unspecified: Secondary | ICD-10-CM | POA: Insufficient documentation

## 2018-10-18 DIAGNOSIS — I471 Supraventricular tachycardia: Secondary | ICD-10-CM

## 2018-10-18 DIAGNOSIS — M069 Rheumatoid arthritis, unspecified: Secondary | ICD-10-CM | POA: Insufficient documentation

## 2018-10-18 DIAGNOSIS — I480 Paroxysmal atrial fibrillation: Secondary | ICD-10-CM

## 2018-10-18 DIAGNOSIS — I130 Hypertensive heart and chronic kidney disease with heart failure and stage 1 through stage 4 chronic kidney disease, or unspecified chronic kidney disease: Secondary | ICD-10-CM | POA: Diagnosis not present

## 2018-10-18 DIAGNOSIS — Z885 Allergy status to narcotic agent status: Secondary | ICD-10-CM | POA: Diagnosis not present

## 2018-10-18 DIAGNOSIS — I4719 Other supraventricular tachycardia: Secondary | ICD-10-CM

## 2018-10-18 DIAGNOSIS — H409 Unspecified glaucoma: Secondary | ICD-10-CM | POA: Insufficient documentation

## 2018-10-18 DIAGNOSIS — Z880 Allergy status to penicillin: Secondary | ICD-10-CM | POA: Diagnosis not present

## 2018-10-18 DIAGNOSIS — I495 Sick sinus syndrome: Secondary | ICD-10-CM | POA: Diagnosis not present

## 2018-10-18 DIAGNOSIS — Z8719 Personal history of other diseases of the digestive system: Secondary | ICD-10-CM | POA: Insufficient documentation

## 2018-10-18 DIAGNOSIS — I442 Atrioventricular block, complete: Secondary | ICD-10-CM

## 2018-10-18 DIAGNOSIS — I428 Other cardiomyopathies: Secondary | ICD-10-CM | POA: Insufficient documentation

## 2018-10-18 DIAGNOSIS — Z7901 Long term (current) use of anticoagulants: Secondary | ICD-10-CM | POA: Insufficient documentation

## 2018-10-18 DIAGNOSIS — N183 Chronic kidney disease, stage 3 (moderate): Secondary | ICD-10-CM | POA: Diagnosis not present

## 2018-10-18 DIAGNOSIS — Z950 Presence of cardiac pacemaker: Secondary | ICD-10-CM | POA: Diagnosis not present

## 2018-10-18 DIAGNOSIS — J449 Chronic obstructive pulmonary disease, unspecified: Secondary | ICD-10-CM | POA: Insufficient documentation

## 2018-10-18 DIAGNOSIS — R000 Tachycardia, unspecified: Secondary | ICD-10-CM

## 2018-10-18 HISTORY — PX: CARDIOVERSION: EP1203

## 2018-10-18 SURGERY — CARDIOVERSION (CATH LAB)

## 2018-10-18 MED ORDER — MIDAZOLAM HCL 2 MG/2ML IJ SOLN
INTRAMUSCULAR | Status: DC | PRN
  Administered 2018-10-18: 1 mg via INTRAVENOUS
  Administered 2018-10-18 (×2): 2 mg via INTRAVENOUS

## 2018-10-18 MED ORDER — MIDAZOLAM HCL 5 MG/5ML IJ SOLN
INTRAMUSCULAR | Status: AC
  Filled 2018-10-18: qty 5

## 2018-10-18 MED ORDER — FENTANYL CITRATE (PF) 100 MCG/2ML IJ SOLN
INTRAMUSCULAR | Status: AC
  Filled 2018-10-18: qty 2

## 2018-10-18 MED ORDER — SODIUM CHLORIDE 0.9 % IV SOLN
INTRAVENOUS | Status: DC
  Administered 2018-10-18: 07:00:00 via INTRAVENOUS

## 2018-10-18 SURGICAL SUPPLY — 1 items: PAD PRO RADIOLUCENT 2001M-C (PAD) ×2 IMPLANT

## 2018-10-18 NOTE — Interval H&P Note (Signed)
History and Physical Interval Note:  10/18/2018 8:13 AM  [name]  has presented today for surgery, with the diagnosis of hl TACHACARDIA  The various methods of treatment have been discussed with the patient and family. After consideration of risks, benefits and other options for treatment, the patient has consented to  Procedure(s): CARDIOVERSION (N/A) as a surgical intervention .  The patient's history has been reviewed, patient examined, no change in status, stable for surgery.  I have reviewed the patient's chart and labs.  Questions were answered to the patient's satisfaction.     Virl Axe

## 2018-10-18 NOTE — Discharge Instructions (Signed)
Electrical Cardioversion, Care After °This sheet gives you information about how to care for yourself after your procedure. Your health care provider may also give you more specific instructions. If you have problems or questions, contact your health care provider. °What can I expect after the procedure? °After the procedure, it is common to have: °· Some redness on the skin where the shocks were given. °Follow these instructions at home: ° °· Do not drive for 24 hours if you were given a medicine to help you relax (sedative). °· Take over-the-counter and prescription medicines only as told by your health care provider. °· Ask your health care provider how to check your pulse. Check it often. °· Rest for 48 hours after the procedure or as told by your health care provider. °· Avoid or limit your caffeine use as told by your health care provider. °Contact a health care provider if: °· You feel like your heart is beating too quickly or your pulse is not regular. °· You have a serious muscle cramp that does not go away. °Get help right away if: ° °· You have discomfort in your chest. °· You are dizzy or you feel faint. °· You have trouble breathing or you are short of breath. °· Your speech is slurred. °· You have trouble moving an arm or leg on one side of your body. °· Your fingers or toes turn cold or blue. °This information is not intended to replace advice given to you by your health care provider. Make sure you discuss any questions you have with your health care provider. °Document Released: 07/13/2013 Document Revised: 04/25/2016 Document Reviewed: 03/28/2016 °Elsevier Interactive Patient Education © 2019 [employer] °Moderate Conscious Sedation, Adult, Care After °These instructions provide you with information about caring for yourself after your procedure. Your health care provider may also give you more specific instructions. Your treatment has been planned according to current medical practices, but  problems sometimes occur. Call your health care provider if you have any problems or questions after your procedure. °What can I expect after the procedure? °After your procedure, it is common: °· To feel sleepy for several hours. °· To feel clumsy and have poor balance for several hours. °· To have poor judgment for several hours. °· To vomit if you eat too soon. °Follow these instructions at home: °For at least 24 hours after the procedure: ° °· Do not: °? Participate in activities where you could fall or become injured. °? Drive. °? Use heavy machinery. °? Drink alcohol. °? Take sleeping pills or medicines that cause drowsiness. °? Make important decisions or sign legal documents. °? Take care of children on your own. °· Rest. °Eating and drinking °· Follow the diet recommended by your health care provider. °· If you vomit: °? Drink water, juice, or soup when you can drink without vomiting. °? Make sure you have little or no nausea before eating solid foods. °General instructions °· Have a responsible adult stay with you until you are awake and alert. °· Take over-the-counter and prescription medicines only as told by your health care provider. °· If you smoke, do not smoke without supervision. °· Keep all follow-up visits as told by your health care provider. This is important. °Contact a health care provider if: °· You keep feeling nauseous or you keep vomiting. °· You feel light-headed. °· You develop a rash. °· You have a fever. °Get help right away if: °· You have trouble breathing. °This information is not intended   to replace advice given to you by your health care provider. Make sure you discuss any questions you have with your health care provider. °Document Released: 07/13/2013 Document Revised: 02/25/2016 Document Reviewed: 01/12/2016 °Elsevier Interactive Patient Education © 2019 [employer] Inc. ° °

## 2018-10-19 ENCOUNTER — Ambulatory Visit (INDEPENDENT_AMBULATORY_CARE_PROVIDER_SITE_OTHER): Payer: Medicare Other | Admitting: Bariatrics

## 2018-10-19 ENCOUNTER — Encounter (INDEPENDENT_AMBULATORY_CARE_PROVIDER_SITE_OTHER): Payer: Self-pay | Admitting: Bariatrics

## 2018-10-19 VITALS — BP 106/72 | HR 99 | Temp 97.9°F | Ht 63.0 in | Wt 181.0 lb

## 2018-10-19 DIAGNOSIS — Z6832 Body mass index (BMI) 32.0-32.9, adult: Secondary | ICD-10-CM

## 2018-10-19 DIAGNOSIS — E8881 Metabolic syndrome: Secondary | ICD-10-CM | POA: Diagnosis not present

## 2018-10-19 DIAGNOSIS — E669 Obesity, unspecified: Secondary | ICD-10-CM

## 2018-10-19 DIAGNOSIS — I10 Essential (primary) hypertension: Secondary | ICD-10-CM | POA: Diagnosis not present

## 2018-10-19 LAB — CUP PACEART INCLINIC DEVICE CHECK
Battery Remaining Longevity: 80 mo
Battery Voltage: 2.96 V
Brady Statistic RA Percent Paced: 58 %
Brady Statistic RV Percent Paced: 99.98 %
Implantable Lead Implant Date: 19990414
Implantable Lead Implant Date: 19990414
Implantable Lead Implant Date: 20180716
Implantable Lead Location: 753858
Implantable Lead Location: 753860
Implantable Pulse Generator Implant Date: 20150330
Lead Channel Impedance Value: 1250 Ohm
Lead Channel Impedance Value: 337.5 Ohm
Lead Channel Impedance Value: 437.5 Ohm
Lead Channel Pacing Threshold Amplitude: 1.25 V
Lead Channel Pacing Threshold Amplitude: 1.25 V
Lead Channel Pacing Threshold Pulse Width: 0.5 ms
Lead Channel Pacing Threshold Pulse Width: 0.6 ms
Lead Channel Sensing Intrinsic Amplitude: 5 mV
Lead Channel Setting Pacing Amplitude: 2 V
Lead Channel Setting Pacing Amplitude: 2 V
Lead Channel Setting Pacing Pulse Width: 0.5 ms
Lead Channel Setting Pacing Pulse Width: 0.6 ms
Lead Channel Setting Sensing Sensitivity: 4 mV
MDC IDC LEAD LOCATION: 753859
MDC IDC SESS DTM: 20200110210011
MDC IDC SET LEADCHNL RV PACING AMPLITUDE: 2.25 V
Pulse Gen Model: 3242
Pulse Gen Serial Number: 7610861

## 2018-10-19 MED FILL — Midazolam HCl Inj 5 MG/5ML (Base Equivalent): INTRAMUSCULAR | Qty: 5 | Status: AC

## 2018-10-20 NOTE — Progress Notes (Signed)
Office: [phone]  /  Fax: [phone]   HPI:   Chief Complaint: OBESITY [name] is here to discuss her progress with her obesity treatment plan. She is on the Category 1 plan and is following her eating plan approximately 95 % of the time. She states she is exercising 0 minutes 0 times per week. Emileigh states that she is not struggling with the diet. Her weight is 181 lb (82.1 kg) today and she has maintained weight over a period of 2 weeks since her last visit. She has lost 2 lbs since starting treatment with [place].  Insulin Resistance [name] has a diagnosis of insulin resistance based on her elevated fasting insulin level >5. Although [name]'s blood glucose readings are still under good control, insulin resistance puts her at greater risk of metabolic syndrome and diabetes. Her last A1c was at 5.5 and last insulin level was at 14.6 She is not taking medications currently and continues to work on diet and exercise to decrease risk of diabetes.  Hypertension [name] is a 73 y.o. female with hypertension. She has a history of congestive heart failure and is taking Coreg and Lasix.  [name] denies shortness of breath on exertion. She is working weight loss to help control her blood pressure with the goal of decreasing her risk of heart attack and stroke. [name] blood pressure is well controlled.  ASSESSMENT AND PLAN:  Insulin resistance  Essential hypertension  Class 1 obesity with serious comorbidity and body mass index (BMI) of 32.0 to 32.9 in adult, unspecified obesity type  PLAN:  Insulin Resistance [name] will continue to work on weight loss, exercise, increasing lean protein and healthy fats, and decreasing simple carbohydrates in her diet to help decrease the risk of diabetes. She was informed that eating too many simple carbohydrates or too many calories at one sitting increases the likelihood of GI side effects. [name] agreed to follow up with [place] as  directed to monitor her progress.  Hypertension We discussed sodium restriction, working on healthy weight loss, and a regular exercise program as the means to achieve improved blood pressure control. [name] agreed with this plan and agreed to follow up as directed. We will continue to monitor her blood pressure as well as her progress with the above lifestyle modifications. She will continue her medications as prescribed and will follow up with her PCP and cardiologist. She watch for signs of hypotension as she continues her lifestyle modifications.  Obesity [name] is currently in the action stage of change. As such, her goal is to continue with weight loss efforts She has agreed to follow the Category 1 plan [name] has been instructed to work up to a goal of 150 minutes of combined cardio and strengthening exercise per week for weight loss and overall health benefits. We discussed the following Behavioral Modification Strategies today: increase H2O intake (80 ounces) per her cardiologist, keeping healthy foods in the home, increasing lean protein intake, decreasing simple carbohydrates, increasing vegetables and work on meal planning and easy cooking plans Handouts for homemade seasonings and store bought seasonings were provided to patient today.  [name] has agreed to follow up with our clinic in 2 weeks. She was informed of the importance of frequent follow up visits to maximize her success with intensive lifestyle modifications for her multiple health conditions.  ALLERGIES: Allergies  Allergen Reactions  . Morphine And Related Anaphylaxis    Throat closing  . Codeine Itching  . Methotrexate Derivatives  Itchy rash   . Penicillins Itching    Has patient had a PCN reaction causing immediate rash, facial/tongue/throat swelling, SOB or lightheadedness with hypotension: No Has patient had a PCN reaction causing severe rash involving mucus membranes or skin necrosis: No Has patient  had a PCN reaction that required hospitalization: No Has patient had a PCN reaction occurring within the last 10 years: No If all of the above answers are "NO", then may proceed with Cephalosporin use.     MEDICATIONS: Current Outpatient Medications on File Prior to Visit  Medication Sig Dispense Refill  . bimatoprost (LUMIGAN) 0.01 % SOLN Place 1 drop into the right eye at bedtime.     . carvedilol (COREG) 25 MG tablet TAKE 1 TABLET BY MOUTH TWICE DAILY WITH A MEAL (Patient taking differently: Take 25 mg by mouth 2 (two) times daily with a meal. ) 180 tablet 1  . cycloSPORINE (RESTASIS) 0.05 % ophthalmic emulsion Place 1 drop into the right eye 2 (two) times daily.     Marland Kitchen ELIQUIS 5 MG TABS tablet TAKE 1 TABLET BY MOUTH TWICE DAILY 60 tablet 3  . ezetimibe (ZETIA) 10 MG tablet Take 10 mg by mouth daily at 2 PM.     . famotidine (PEPCID) 20 MG tablet TAKE 1 TABLET BY MOUTH TWICE DAILY 60 tablet 6  . furosemide (LASIX) 20 MG tablet TAKE 1 TABLET BY MOUTH EVERY DAY FOR SWELLING AND SHORTNESS OF BREATH (Patient taking differently: Take 20 mg by mouth daily. TAKE 1 TABLET BY MOUTH EVERY DAY FOR SWELLING AND SHORTNESS OF BREATH) 90 tablet 1  . hydroxychloroquine (PLAQUENIL) 200 MG tablet Take 200 mg by mouth 2 (two) times daily.     Javier Docker Oil 300 MG CAPS Take 1 capsule by mouth daily.     Marland Kitchen loteprednol (LOTEMAX) 0.5 % ophthalmic suspension Place 1 drop into both eyes 4 (four) times daily.     . Multiple Vitamins-Minerals (PRESERVISION/LUTEIN) CAPS Take 1 capsule by mouth 2 (two) times daily. In the morning & afternoon    . nitroGLYCERIN (NITROSTAT) 0.4 MG SL tablet Place 0.4 mg under the tongue every 5 (five) minutes as needed for chest pain. For chest pain     No current facility-administered medications on file prior to visit.     PAST MEDICAL HISTORY: Past Medical History:  Diagnosis Date  . Anemia   . Arthritis    "all over" (04/20/2017)  . Atrial tachycardia ([place])   . Back pain   .  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 Cataract   . CHF (congestive heart failure) ([place])   . CKD (chronic kidney disease), stage III ([place])   . Complete heart block ([facility])    a. s/p STJ dual chamber pacemaker 1999; upgrade to CRTP 2018  . Constipation   . COPD (chronic obstructive pulmonary disease) ([place])   . Diverticulosis   . Dyspnea   . Glaucoma   . HLD (hyperlipidemia)   . HTN (hypertension)   . Iritis   . Joint pain   . Leg edema   . NICM (nonischemic cardiomyopathy) ([place])    echo 1/11: EF 35-40%, mod LVH, Grade 1 diast dysfxn, mild BAE;    cath 2/06: luminal irregs;  Myoview 10/2:  no scar or ischemia, EF 37%   . OSA (obstructive sleep apnea) 08/21/2016   "never RX'd mask" (04/20/2017)  . Pacemaker    SA node dysfunction; Dr. [name]  . Paroxysmal atrial fibrillation (HCC)   .  Rheumatic fever   . Rheumatoid arthritis ([place])    Dr. Trudie Reed  . 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 Trudie Reed  . Stomach ulcer   . Stroke [facility]) ~ 2016   "mini-stroke"; denies residual on 04/20/2017)  . Syncope 05/2011   PRESYNCOPE  . Tuberculosis 1950s/1960s?    PAST SURGICAL HISTORY: Past Surgical History:  Procedure Laterality Date  . BIV UPGRADE N/A 04/20/2017   Procedure: BiV Upgrade;  Surgeon: Deboraha Sprang, MD;  Location: [facility];  Service: Cardiovascular;  Laterality: N/A;  . CARDIAC CATHETERIZATION    . CARDIOVERSION N/A 10/18/2018   Procedure: CARDIOVERSION;  Surgeon: Deboraha Sprang, MD;  Location: [facility];  Service: Cardiovascular;  Laterality: N/A;  . CESAREAN SECTION    . colonoscopy  11/17/2017   polyps, repeat 5 years Dr. Therisa Doyne  . EYE SURGERY    . GLAUCOMA SURGERY Right   . INSERT / REPLACE / [place]; 12/26/13   STJ dual chamber pacemaker implanted 1999; gen change 12/2013 by Dr [name] - STJ   . PERMANENT PACEMAKER GENERATOR CHANGE N/A 01/02/2014   Procedure: PERMANENT PACEMAKER GENERATOR CHANGE;   Surgeon: Deboraha Sprang, MD;  Location: [facility];  Service: Cardiovascular;  Laterality: N/A;  . TUBAL LIGATION    . VAGINAL HYSTERECTOMY      SOCIAL HISTORY: Social History   Tobacco Use  . Smoking status: Former Smoker    Packs/day: 0.50    Years: 8.00    Pack years: 4.00    Types: Cigarettes    Last attempt to quit: 10/06/1988    Years since quitting: 30.0  . Smokeless tobacco: Never Used  Substance Use Topics  . Alcohol use: No  . Drug use: No    FAMILY HISTORY: Family History  Problem Relation Age of Onset  . Heart disease Father   . Asthma Father   . Stroke Father   . Alcoholism Father   . Rheum arthritis Mother   . Stroke Mother   . Diabetes Sister   . Colon cancer Brother   . Colon cancer Brother   . Coronary artery disease Sister   . Coronary artery disease Sister     ROS: Review of Systems  Constitutional: Negative for weight loss.  Respiratory: Negative for shortness of breath (on exertion).     PHYSICAL EXAM: Blood pressure 106/72, pulse 99, temperature 97.9 F (36.6 C), temperature source Oral, height 5\' 3"  (1.6 m), weight 181 lb (82.1 kg), SpO2 99 %. Body mass index is 32.06 kg/m. Physical Exam Vitals signs reviewed.  Constitutional:      Appearance: Normal appearance. She is well-developed. She is obese.  Cardiovascular:     Rate and Rhythm: Normal rate.  Pulmonary:     Effort: Pulmonary effort is normal.  Musculoskeletal: Normal range of motion.  Skin:    General: Skin is warm and dry.  Neurological:     Mental Status: She is alert and oriented to person, place, and time.  Psychiatric:        Mood and Affect: Mood normal.        Behavior: Behavior normal.     RECENT LABS AND TESTS: BMET    Component Value Date/Time   NA 143 10/15/2018 1653   K 4.6 10/15/2018 1653   CL 104 10/15/2018 1653   CO2 23 10/15/2018 1653   GLUCOSE 84 10/15/2018 1653   GLUCOSE 74 03/16/2018 1827  BUN 36 (H) 10/15/2018 1653   CREATININE 2.26 (H)  10/15/2018 1653   CREATININE 1.82 (H) 03/24/2016 0806   CALCIUM 10.1 10/15/2018 1653   GFRNONAA 21 (L) 10/15/2018 1653   GFRAA 25 (L) 10/15/2018 1653   Lab Results  Component Value Date   HGBA1C 5.5 08/12/2018   Lab Results  Component Value Date   INSULIN 14.6 08/12/2018   CBC    Component Value Date/Time   WBC 4.1 10/15/2018 1653   WBC 3.6 (L) 03/16/2018 1827   RBC 3.79 10/15/2018 1653   RBC 3.63 (L) 03/16/2018 1827   HGB 11.3 10/15/2018 1653   HCT 34.4 10/15/2018 1653   PLT 199 10/15/2018 1653   MCV 91 10/15/2018 1653   MCH 29.8 10/15/2018 1653   MCH 29.5 03/16/2018 1827   MCHC 32.8 10/15/2018 1653   MCHC 31.5 03/16/2018 1827   RDW 12.4 10/15/2018 1653   LYMPHSABS 0.6 (L) 08/12/2018 0958   MONOABS 0.4 03/16/2018 1827   EOSABS 0.0 08/12/2018 0958   BASOSABS 0.0 08/12/2018 0958   Iron/TIBC/Ferritin/ %Sat    Component Value Date/Time   IRON 76 05/24/2011 0240   TIBC 336 05/24/2011 0240   FERRITIN 176 05/24/2011 0240   IRONPCTSAT 23 05/24/2011 0240   Lipid Panel     Component Value Date/Time   CHOL 223 (H) 08/12/2018 0958   TRIG 123 08/12/2018 0958   HDL 60 08/12/2018 0958   LDLCALC 138 (H) 08/12/2018 0958   Hepatic Function Panel     Component Value Date/Time   PROT 6.8 08/12/2018 0958   ALBUMIN 4.2 08/12/2018 0958   AST 26 08/12/2018 0958   ALT 15 08/12/2018 0958   ALKPHOS 77 08/12/2018 0958   BILITOT 0.5 08/12/2018 0958      Component Value Date/Time   TSH 3.020 08/12/2018 0958   TSH 2.540 02/18/2017 1043   TSH 2.271 05/24/2011 0240     Ref. Range 08/12/2018 09:58  Vitamin D, 25-Hydroxy Latest Ref Range: 30.0 - 100.0 ng/mL 23.3 (L)     OBESITY BEHAVIORAL INTERVENTION VISIT  Today's visit was # 5   Starting weight: 183 lbs Starting date: 08/12/2018 Today's weight : 181 lbs Today's date: 10/19/2018 Total lbs lost to date: 2 At least 15 minutes were spent on discussing the following behavioral intervention visit.   ASK: We discussed the  diagnosis of obesity with [name] today and [name] agreed to give [place] permission to discuss obesity behavioral modification therapy today.  ASSESS: [name] has the diagnosis of obesity and her BMI today is 32.07 [place] is in the action stage of change   ADVISE: [name] was educated on the multiple health risks of obesity as well as the benefit of weight loss to improve her health. She was advised of the need for long term treatment and the importance of lifestyle modifications to improve her current health and to decrease her risk of future health problems.  AGREE: Multiple dietary modification options and treatment options were discussed and  [name] agreed to follow the recommendations documented in the above note.  ARRANGE: [name] was educated on the importance of frequent visits to treat obesity as outlined per CMS and USPSTF guidelines and agreed to schedule her next follow up appointment today.  Corey Skains, am acting as Location manager for [employer]. Owens Shark, DO  I have reviewed the above documentation for accuracy and completeness, and I agree with the above. -Jearld Lesch, DO

## 2018-10-21 DIAGNOSIS — I10 Essential (primary) hypertension: Secondary | ICD-10-CM | POA: Insufficient documentation

## 2018-10-22 LAB — CUP PACEART REMOTE DEVICE CHECK
Battery Remaining Longevity: 83 mo
Battery Voltage: 2.96 V
Brady Statistic AP VP Percent: 73 %
Brady Statistic AP VS Percent: 1 %
Brady Statistic AS VP Percent: 27 %
Brady Statistic AS VS Percent: 1 %
Brady Statistic RA Percent Paced: 67 %
Date Time Interrogation Session: 20191121070030
Implantable Lead Implant Date: 19990414
Implantable Lead Implant Date: 19990414
Implantable Lead Implant Date: 20180716
Implantable Lead Location: 753858
Implantable Lead Location: 753859
Implantable Lead Location: 753860
Implantable Pulse Generator Implant Date: 20150330
Lead Channel Impedance Value: 1275 Ohm
Lead Channel Impedance Value: 340 Ohm
Lead Channel Impedance Value: 440 Ohm
Lead Channel Pacing Threshold Amplitude: 0.75 V
Lead Channel Pacing Threshold Amplitude: 1.375 V
Lead Channel Pacing Threshold Amplitude: 1.375 V
Lead Channel Pacing Threshold Pulse Width: 0.5 ms
Lead Channel Pacing Threshold Pulse Width: 0.6 ms
Lead Channel Sensing Intrinsic Amplitude: 12 mV
Lead Channel Sensing Intrinsic Amplitude: 5 mV
Lead Channel Setting Pacing Amplitude: 2 V
Lead Channel Setting Pacing Amplitude: 2.125
Lead Channel Setting Pacing Amplitude: 2.375
Lead Channel Setting Pacing Pulse Width: 0.5 ms
Lead Channel Setting Pacing Pulse Width: 0.6 ms
Lead Channel Setting Sensing Sensitivity: 4 mV
MDC IDC MSMT BATTERY REMAINING PERCENTAGE: 95.5 %
MDC IDC MSMT LEADCHNL RA PACING THRESHOLD PULSEWIDTH: 0.4 ms
MDC IDC PG SERIAL: 7610861
Pulse Gen Model: 3242

## 2018-10-27 DIAGNOSIS — G4733 Obstructive sleep apnea (adult) (pediatric): Secondary | ICD-10-CM

## 2018-10-28 ENCOUNTER — Telehealth: Payer: Self-pay | Admitting: Pulmonary Disease

## 2018-10-28 DIAGNOSIS — G4733 Obstructive sleep apnea (adult) (pediatric): Secondary | ICD-10-CM

## 2018-10-28 NOTE — Telephone Encounter (Signed)
HST 10/27/18 >> AHI 14.9, SpO2 low 76%  Please let her know that the sleep study still shows mild to moderate obstructive sleep apnea.  She should d/w her dentist about whether she is a candidate for an oral appliance to treat sleep apnea.

## 2018-10-28 NOTE — Telephone Encounter (Signed)
Spoke with pt, she states she already spoke to her dentist and she states she couldn't get the oral appliance due to it not being covered under her insurance. She will try another dentist is you prefer but it has to be covered through her insurance. VS please advise.

## 2018-10-28 NOTE — Telephone Encounter (Signed)
She should make sure the run this through medical insurance and not dental insurance.  Could refer to Dr. Augustina Mood or Dr. Oneal Grout if she would like to pursue oral appliance further.

## 2018-10-28 NOTE — Telephone Encounter (Signed)
Called and spoke with Patient.  Patient stated that she only has medicaid, and was told by her Dentist that they could provide her with oral appliance.  Patient agreed to have referral for oral appliance to see if that could possibly help.  Orthodontics referral placed.  Nothing further at this time.

## 2018-10-29 ENCOUNTER — Telehealth: Payer: Self-pay | Admitting: Nurse Practitioner

## 2018-10-29 ENCOUNTER — Telehealth: Payer: Self-pay | Admitting: Internal Medicine

## 2018-10-29 ENCOUNTER — Other Ambulatory Visit: Payer: Self-pay | Admitting: Nurse Practitioner

## 2018-10-29 ENCOUNTER — Other Ambulatory Visit: Payer: Self-pay | Admitting: *Deleted

## 2018-10-29 MED ORDER — AMIODARONE HCL 200 MG PO TABS: ORAL_TABLET | ORAL | 0 refills | Status: DC

## 2018-10-29 NOTE — Telephone Encounter (Signed)
[place] transmission reviewed, pt back in persistent atrial tachycardia. Per Dr [name], start Amiodarone 400mg  bid x2 weeks, then 400mg  daily for 2 weeks. Keep appointment on 11/23/18. If still out of rhythm at that time will schedule repeat DCCV.  Recent labs reviewed (TSH, LFT's stable).  Will need recheck at follow up. All questions answered.   Chanetta Marshall, NP 10/29/2018 11:36 AM

## 2018-10-29 NOTE — Telephone Encounter (Signed)
New Message    Pt c/o medication issue:  1. Name of Medication: Amiodarone  2. How are you currently taking this medication (dosage and times per day)? 200mg  twice a day after 2 weeks once a day  3. Are you having a reaction (difficulty breathing--STAT)? No   4. What is your medication issue? Amount to be dispensed pharmacy is questioning the script says 444 but it's an unusual amount.

## 2018-10-29 NOTE — Addendum Note (Signed)
Addended by: Juventino Slovak on: 10/29/2018 05:12 PM   Modules accepted: Orders

## 2018-10-29 NOTE — Telephone Encounter (Signed)
Quantity has been updated per pharmacy request.

## 2018-10-30 ENCOUNTER — Other Ambulatory Visit: Payer: Self-pay | Admitting: Internal Medicine

## 2018-11-01 ENCOUNTER — Other Ambulatory Visit: Payer: Self-pay | Admitting: Internal Medicine

## 2018-11-01 MED ORDER — CARVEDILOL 25 MG PO TABS: ORAL_TABLET | ORAL | 3 refills | Status: DC

## 2018-11-01 MED ORDER — FUROSEMIDE 20 MG PO TABS: 20.0000 mg | ORAL_TABLET | Freq: Every day | ORAL | 3 refills | Status: DC

## 2018-11-02 ENCOUNTER — Ambulatory Visit (INDEPENDENT_AMBULATORY_CARE_PROVIDER_SITE_OTHER): Payer: Medicare Other | Admitting: Family Medicine

## 2018-11-02 ENCOUNTER — Encounter (INDEPENDENT_AMBULATORY_CARE_PROVIDER_SITE_OTHER): Payer: Self-pay | Admitting: Family Medicine

## 2018-11-02 VITALS — BP 106/70 | HR 95 | Temp 98.2°F | Ht 63.0 in | Wt 179.0 lb

## 2018-11-02 DIAGNOSIS — E669 Obesity, unspecified: Secondary | ICD-10-CM

## 2018-11-02 DIAGNOSIS — Z6831 Body mass index (BMI) 31.0-31.9, adult: Secondary | ICD-10-CM | POA: Diagnosis not present

## 2018-11-02 DIAGNOSIS — E8881 Metabolic syndrome: Secondary | ICD-10-CM

## 2018-11-02 NOTE — Progress Notes (Signed)
Office: [phone]  /  Fax: [phone]   HPI:   Chief Complaint: OBESITY [name] is here to discuss her progress with her obesity treatment plan. She is on the Category 1 plan and is following her eating plan approximately 90 % of the time. She states she is exercising 0 minutes 0 times per week. [name] is eating all food. She does substitute yogurt for 2 ounces of meat at times. She does not always eat snack calories.  She has had increased stress recently as she was diagnosed with complete AV block. She had a cardioversion which was unsuccessful and she has been started on Amiodarone. Her weight is 179 lb (81.2 kg) today and has had a weight loss of 2 pounds over a period of 2 weeks since her last visit. She has lost 4 lbs since starting treatment with [place].  Insulin Resistance [name] has a diagnosis of insulin resistance based on her elevated fasting insulin level >5. Although [name]'s blood glucose readings are still under good control, insulin resistance puts her at greater risk of metabolic syndrome and diabetes. She is not taking metformin currently and continues to work on diet and exercise to decrease risk of diabetes. She is negative for polyphagia.  ASSESSMENT AND PLAN:  Insulin resistance  Class 1 obesity with serious comorbidity and body mass index (BMI) of 31.0 to 31.9 in adult, unspecified obesity type  PLAN:  Insulin Resistance [name] will continue to work on weight loss, exercise, and decreasing simple carbohydrates in her diet to help decrease the risk of diabetes. She was informed that eating too many simple carbohydrates or too many calories at one sitting increases the likelihood of GI side effects. [name] agrees to continue with meal plan and follow up with our clinic in 2 weeks.   Obesity [name] is currently in the action stage of change. As such, her goal is to continue with weight loss efforts She has agreed to follow the Category 1 plan [name] has  not been prescribed exercise at this time. We discussed the following Behavioral Modification Strategies today: increasing lean protein intake and planning for success  [name] has agreed to follow up with our clinic in 2 weeks. She was informed of the importance of frequent follow up visits to maximize her success with intensive lifestyle modifications for her multiple health conditions.  ALLERGIES: Allergies  Allergen Reactions  . Morphine And Related Anaphylaxis    Throat closing  . Codeine Itching  . Methotrexate Derivatives     Itchy rash   . Penicillins Itching    Has patient had a PCN reaction causing immediate rash, facial/tongue/throat swelling, SOB or lightheadedness with hypotension: No Has patient had a PCN reaction causing severe rash involving mucus membranes or skin necrosis: No Has patient had a PCN reaction that required hospitalization: No Has patient had a PCN reaction occurring within the last 10 years: No If all of the above answers are "NO", then may proceed with Cephalosporin use.     MEDICATIONS: Current Outpatient Medications on File Prior to Visit  Medication Sig Dispense Refill  . amiodarone (PACERONE) 200 MG tablet Take 2 tablets (400 mg total) by mouth 2 (two) times daily for 14 days, THEN 2 tablets (400 mg total) daily for 14 days. 84 tablet 0  . bimatoprost (LUMIGAN) 0.01 % SOLN Place 1 drop into the right eye at bedtime.     . carvedilol (COREG) 25 MG tablet TAKE 1 TABLET BY MOUTH TWICE DAILY WITH A MEAL 180  tablet 3  . cycloSPORINE (RESTASIS) 0.05 % ophthalmic emulsion Place 1 drop into the right eye 2 (two) times daily.     Marland Kitchen ELIQUIS 5 MG TABS tablet TAKE 1 TABLET BY MOUTH TWICE DAILY 60 tablet 3  . ezetimibe (ZETIA) 10 MG tablet Take 10 mg by mouth daily at 2 PM.     . famotidine (PEPCID) 20 MG tablet TAKE 1 TABLET BY MOUTH TWICE DAILY 60 tablet 6  . furosemide (LASIX) 20 MG tablet Take 1 tablet (20 mg total) by mouth daily. TAKE 1 TABLET BY MOUTH  EVERY DAY FOR SWELLING AND SHORTNESS OF BREATH 90 tablet 3  . hydroxychloroquine (PLAQUENIL) 200 MG tablet Take 200 mg by mouth 2 (two) times daily.     Javier Docker Oil 300 MG CAPS Take 1 capsule by mouth daily.     Marland Kitchen loteprednol (LOTEMAX) 0.5 % ophthalmic suspension Place 1 drop into both eyes 4 (four) times daily.     . Multiple Vitamins-Minerals (PRESERVISION/LUTEIN) CAPS Take 1 capsule by mouth 2 (two) times daily. In the morning & afternoon    . nitroGLYCERIN (NITROSTAT) 0.4 MG SL tablet Place 0.4 mg under the tongue every 5 (five) minutes as needed for chest pain. For chest pain     No current facility-administered medications on file prior to visit.     PAST MEDICAL HISTORY: Past Medical History:  Diagnosis Date  . Anemia   . Arthritis    "all over" (04/20/2017)  . Atrial tachycardia ([place])   . Back pain   . 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 Cataract   . CHF (congestive heart failure) ([place])   . CKD (chronic kidney disease), stage III ([place])   . Complete heart block ([facility])    a. s/p STJ dual chamber pacemaker 1999; upgrade to CRTP 2018  . Constipation   . COPD (chronic obstructive pulmonary disease) ([place])   . Diverticulosis   . Dyspnea   . Glaucoma   . HLD (hyperlipidemia)   . HTN (hypertension)   . Iritis   . Joint pain   . Leg edema   . NICM (nonischemic cardiomyopathy) ([place])    echo 1/11: EF 35-40%, mod LVH, Grade 1 diast dysfxn, mild BAE;    cath 2/06: luminal irregs;  Myoview 10/2:  no scar or ischemia, EF 37%   . OSA (obstructive sleep apnea) 08/21/2016   "never RX'd mask" (04/20/2017)  . Pacemaker    SA node dysfunction; Dr. [name]  . Paroxysmal atrial fibrillation (HCC)   . Rheumatic fever   . Rheumatoid arthritis ([place])    Dr. Trudie Reed  . 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 Trudie Reed  . Stomach ulcer   . Stroke [facility]) ~ 2016   "mini-stroke"; denies residual on 04/20/2017)  . Syncope 05/2011   PRESYNCOPE  . Tuberculosis  1950s/1960s?    PAST SURGICAL HISTORY: Past Surgical History:  Procedure Laterality Date  . BIV UPGRADE N/A 04/20/2017   Procedure: BiV Upgrade;  Surgeon: Deboraha Sprang, MD;  Location: [facility];  Service: Cardiovascular;  Laterality: N/A;  . CARDIAC CATHETERIZATION    . CARDIOVERSION N/A 10/18/2018   Procedure: CARDIOVERSION;  Surgeon: Deboraha Sprang, MD;  Location: [facility];  Service: Cardiovascular;  Laterality: N/A;  . CESAREAN SECTION    . colonoscopy  11/17/2017   polyps, repeat 5 years Dr. Therisa Doyne  . EYE SURGERY    . GLAUCOMA  SURGERY Right   . INSERT / REPLACE / [place]; 12/26/13   STJ dual chamber pacemaker implanted 1999; gen change 12/2013 by Dr [name] - STJ   . PERMANENT PACEMAKER GENERATOR CHANGE N/A 01/02/2014   Procedure: PERMANENT PACEMAKER GENERATOR CHANGE;  Surgeon: Deboraha Sprang, MD;  Location: [facility] CATH LAB;  Service: Cardiovascular;  Laterality: N/A;  . TUBAL LIGATION    . VAGINAL HYSTERECTOMY      SOCIAL HISTORY: Social History   Tobacco Use  . Smoking status: Former Smoker    Packs/day: 0.50    Years: 8.00    Pack years: 4.00    Types: Cigarettes    Last attempt to quit: 10/06/1988    Years since quitting: 30.0  . Smokeless tobacco: Never Used  Substance Use Topics  . Alcohol use: No  . Drug use: No    FAMILY HISTORY: Family History  Problem Relation Age of Onset  . Heart disease Father   . Asthma Father   . Stroke Father   . Alcoholism Father   . Rheum arthritis Mother   . Stroke Mother   . Diabetes Sister   . Colon cancer Brother   . Colon cancer Brother   . Coronary artery disease Sister   . Coronary artery disease Sister     ROS: Review of Systems  Constitutional: Positive for weight loss.  Genitourinary: Negative for frequency.  Endo/Heme/Allergies: Negative for polydipsia.    PHYSICAL EXAM: Blood pressure 106/70, pulse 95, temperature 98.2 F (36.8 C), temperature source Oral, height 5\' 3"  (1.6 m),  weight 179 lb (81.2 kg), SpO2 98 %. Body mass index is 31.71 kg/m. Physical Exam Vitals signs reviewed.  Constitutional:      Appearance: Normal appearance. She is obese.  Cardiovascular:     Rate and Rhythm: Normal rate.     Pulses: Normal pulses.  Pulmonary:     Effort: Pulmonary effort is normal.  Musculoskeletal: Normal range of motion.  Skin:    General: Skin is warm and dry.  Neurological:     Mental Status: She is alert and oriented to person, place, and time.  Psychiatric:        Mood and Affect: Mood normal.        Behavior: Behavior normal.     RECENT LABS AND TESTS: BMET    Component Value Date/Time   NA 143 10/15/2018 1653   K 4.6 10/15/2018 1653   CL 104 10/15/2018 1653   CO2 23 10/15/2018 1653   GLUCOSE 84 10/15/2018 1653   GLUCOSE 74 03/16/2018 1827   BUN 36 (H) 10/15/2018 1653   CREATININE 2.26 (H) 10/15/2018 1653   CREATININE 1.82 (H) 03/24/2016 0806   CALCIUM 10.1 10/15/2018 1653   GFRNONAA 21 (L) 10/15/2018 1653   GFRAA 25 (L) 10/15/2018 1653   Lab Results  Component Value Date   HGBA1C 5.5 08/12/2018   Lab Results  Component Value Date   INSULIN 14.6 08/12/2018   CBC    Component Value Date/Time   WBC 4.1 10/15/2018 1653   WBC 3.6 (L) 03/16/2018 1827   RBC 3.79 10/15/2018 1653   RBC 3.63 (L) 03/16/2018 1827   HGB 11.3 10/15/2018 1653   HCT 34.4 10/15/2018 1653   PLT 199 10/15/2018 1653   MCV 91 10/15/2018 1653   MCH 29.8 10/15/2018 1653   MCH 29.5 03/16/2018 1827   MCHC 32.8 10/15/2018 1653   MCHC 31.5 03/16/2018 1827   RDW 12.4 10/15/2018 1653  LYMPHSABS 0.6 (L) 08/12/2018 0958   MONOABS 0.4 03/16/2018 1827   EOSABS 0.0 08/12/2018 0958   BASOSABS 0.0 08/12/2018 0958   Iron/TIBC/Ferritin/ %Sat    Component Value Date/Time   IRON 76 05/24/2011 0240   TIBC 336 05/24/2011 0240   FERRITIN 176 05/24/2011 0240   IRONPCTSAT 23 05/24/2011 0240   Lipid Panel     Component Value Date/Time   CHOL 223 (H) 08/12/2018 0958   TRIG  123 08/12/2018 0958   HDL 60 08/12/2018 0958   LDLCALC 138 (H) 08/12/2018 0958   Hepatic Function Panel     Component Value Date/Time   PROT 6.8 08/12/2018 0958   ALBUMIN 4.2 08/12/2018 0958   AST 26 08/12/2018 0958   ALT 15 08/12/2018 0958   ALKPHOS 77 08/12/2018 0958   BILITOT 0.5 08/12/2018 0958      Component Value Date/Time   TSH 3.020 08/12/2018 0958   TSH 2.540 02/18/2017 1043   TSH 2.271 05/24/2011 0240      OBESITY BEHAVIORAL INTERVENTION VISIT  Today's visit was # 6   Starting weight: 183 lbs Starting date: 08/12/2018 Today's weight :: 179 lbs Today's date: 11/02/2018 Total lbs lost to date: 4 At least 15 minutes were spent on discussing the following behavioral intervention visit.   ASK: We discussed the diagnosis of obesity with [name] today and [name] agreed to give [place] permission to discuss obesity behavioral modification therapy today.  ASSESS: [name] has the diagnosis of obesity and her BMI today is 31.72 [name] is in the action stage of change   ADVISE: [name] was educated on the multiple health risks of obesity as well as the benefit of weight loss to improve her health. She was advised of the need for long term treatment and the importance of lifestyle modifications to improve her current health and to decrease her risk of future health problems.  AGREE: Multiple dietary modification options and treatment options were discussed and  [name] agreed to follow the recommendations documented in the above note.  ARRANGE: [name] was educated on the importance of frequent visits to treat obesity as outlined per CMS and USPSTF guidelines and agreed to schedule her next follow up appointment today.  I, Tammy Wysor, am acting as Location manager for [employer].  I have reviewed the above documentation for accuracy and completeness, and I agree with the above.  - Kveon Casanas, FNP-C.

## 2018-11-03 ENCOUNTER — Encounter (INDEPENDENT_AMBULATORY_CARE_PROVIDER_SITE_OTHER): Payer: Self-pay | Admitting: Family Medicine

## 2018-11-04 MED ORDER — AMIODARONE HCL 200 MG PO TABS: ORAL_TABLET | ORAL | 0 refills | Status: DC

## 2018-11-15 ENCOUNTER — Ambulatory Visit (INDEPENDENT_AMBULATORY_CARE_PROVIDER_SITE_OTHER): Payer: Medicaid Other

## 2018-11-15 DIAGNOSIS — I5022 Chronic systolic (congestive) heart failure: Secondary | ICD-10-CM

## 2018-11-15 DIAGNOSIS — Z950 Presence of cardiac pacemaker: Secondary | ICD-10-CM

## 2018-11-16 NOTE — Progress Notes (Signed)
EPIC Encounter for ICM Monitoring  Patient Name: [name] is a 73 y.o. female Date: 11/16/2018 Primary Care Physican: Carol Ada, MD Primary Cardiologist:Klein Electrophysiologist:Klein Bi-V Pacing:>99%(last ICM transmission) Last Weight:190lbs Today's Weight: unknown    Transmission reviewed.  The report says it is not available at this time because the device has temporarily suspended the use of RF/high speed telemetry in order to more accurately assess the remaining battery capacity. This is a safety feature designed to prolong the battery longevity of the device. This is most likely the result of frequent or prolonged communication with the device in recent days. Normal telemetry function is expected to resume shortly.  Device clinic nurse reviewed report.   Prescribed:Furosemide20 mg take 1 tablet daily  Labs: 08/12/2018 Creatinine 2.32, BUN 24, Potassium 4.5, Sodium 138, eGFR 21-24 03/16/2018 Creatinine 2.37, BUN 34, Potassium 4.5, Sodium 141, EGFR 20-23 04/13/2017 Creatinine 2.17, BUN 23, Potassium 4.8, Sodium 143  Recommendations:None  Follow-up plan: ICM clinic phone appointment on3/23/2020 since device will be checked at the office visit 11/23/2018 with Tommye Standard, PA.  Copy of ICM check sent to [place].    [name], RN 11/16/2018 7:57 AM

## 2018-11-17 ENCOUNTER — Encounter (INDEPENDENT_AMBULATORY_CARE_PROVIDER_SITE_OTHER): Payer: Self-pay | Admitting: Family Medicine

## 2018-11-17 ENCOUNTER — Ambulatory Visit (INDEPENDENT_AMBULATORY_CARE_PROVIDER_SITE_OTHER): Payer: Medicare Other | Admitting: Family Medicine

## 2018-11-17 VITALS — BP 104/74 | HR 83 | Temp 98.0°F | Ht 63.0 in | Wt 179.0 lb

## 2018-11-17 DIAGNOSIS — D638 Anemia in other chronic diseases classified elsewhere: Secondary | ICD-10-CM

## 2018-11-17 DIAGNOSIS — Z6831 Body mass index (BMI) 31.0-31.9, adult: Secondary | ICD-10-CM | POA: Diagnosis not present

## 2018-11-17 DIAGNOSIS — E669 Obesity, unspecified: Secondary | ICD-10-CM

## 2018-11-17 NOTE — Progress Notes (Signed)
Office: [phone]  /  Fax: [phone]   HPI:   Chief Complaint: OBESITY [name] is here to discuss her progress with her obesity treatment plan. She is on the Category 1 plan and is following her eating plan approximately 90% of the time. She states she is walking 1 mile 3 times per week. [name] is not always eating all the protein on the plan nor does she eat all of the food. She reports she is not hungry.  Her weight is 179 lb (81.2 kg) today and has not lost weight since her last visit. She has lost 4 lbs since starting treatment with [place].  Anemia of Chronic Disease [name] states her PCP did a CBC and she reports she is anemic. Her last hemoglobin level was low normal at 11.3 on 10/15/2018.   ASSESSMENT AND PLAN:  Other iron deficiency anemia  Class 1 obesity with serious comorbidity and body mass index (BMI) of 31.0 to 31.9 in adult, unspecified obesity type  PLAN:  Anemia of Chronic Disease [name] was advised to increase iron rich foods in her diet, i.e., meat, beans, and mushrooms.  Obesity [name] is currently in the action stage of change. As such, her goal is to continue with weight loss efforts. She has agreed to follow the Category 1 plan. [name] was advised to eat all food on her meal plan. [name] will continue current exercise regimen for weight loss and overall health benefits.  We discussed the following Behavioral Modification Strategies today: increasing lean protein intake and planning for success.  [name] has agreed to follow-up with our clinic in 3 weeks. She was informed of the importance of frequent follow up visits to maximize her success with intensive lifestyle modifications for her multiple health conditions.  ALLERGIES: Allergies  Allergen Reactions  . Morphine And Related Anaphylaxis    Throat closing  . Codeine Itching  . Methotrexate Derivatives     Itchy rash   . Penicillins Itching    Has patient had a PCN reaction causing  immediate rash, facial/tongue/throat swelling, SOB or lightheadedness with hypotension: No Has patient had a PCN reaction causing severe rash involving mucus membranes or skin necrosis: No Has patient had a PCN reaction that required hospitalization: No Has patient had a PCN reaction occurring within the last 10 years: No If all of the above answers are "NO", then may proceed with Cephalosporin use.     MEDICATIONS: Current Outpatient Medications on File Prior to Visit  Medication Sig Dispense Refill  . amiodarone (PACERONE) 200 MG tablet Take 2 tablets (400 mg total) by mouth 2 (two) times daily for 14 days, THEN 2 tablets (400 mg total) daily for 14 days, THEN 1 tablet (200 mg total) daily. 444 tablet 0  . amiodarone (PACERONE) 200 MG tablet Take 2 tablets (400 mg total) by mouth 2 (two) times daily for 14 days, THEN 2 tablets (400 mg total) daily for 14 days. 84 tablet 0  . bimatoprost (LUMIGAN) 0.01 % SOLN Place 1 drop into the right eye at bedtime.     . carvedilol (COREG) 25 MG tablet TAKE 1 TABLET BY MOUTH TWICE DAILY WITH A MEAL 180 tablet 3  . cycloSPORINE (RESTASIS) 0.05 % ophthalmic emulsion Place 1 drop into the right eye 2 (two) times daily.     Marland Kitchen ELIQUIS 5 MG TABS tablet TAKE 1 TABLET BY MOUTH TWICE DAILY 60 tablet 3  . ezetimibe (ZETIA) 10 MG tablet Take 10 mg by mouth daily at 2 PM.     .  famotidine (PEPCID) 20 MG tablet TAKE 1 TABLET BY MOUTH TWICE DAILY 60 tablet 6  . furosemide (LASIX) 20 MG tablet Take 1 tablet (20 mg total) by mouth daily. TAKE 1 TABLET BY MOUTH EVERY DAY FOR SWELLING AND SHORTNESS OF BREATH 90 tablet 3  . hydroxychloroquine (PLAQUENIL) 200 MG tablet Take 200 mg by mouth 2 (two) times daily.     [name] Oil 300 MG CAPS Take 1 capsule by mouth daily.     Marland Kitchen loteprednol (LOTEMAX) 0.5 % ophthalmic suspension Place 1 drop into both eyes 4 (four) times daily.     . Multiple Vitamins-Minerals (PRESERVISION/LUTEIN) CAPS Take 1 capsule by mouth 2 (two) times daily.  In the morning & afternoon    . nitroGLYCERIN (NITROSTAT) 0.4 MG SL tablet Place 0.4 mg under the tongue every 5 (five) minutes as needed for chest pain. For chest pain     No current facility-administered medications on file prior to visit.     PAST MEDICAL HISTORY: Past Medical History:  Diagnosis Date  . Anemia   . Arthritis    "all over" (04/20/2017)  . Atrial tachycardia ([place])   . Back pain   . 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 Cataract   . CHF (congestive heart failure) ([place])   . CKD (chronic kidney disease), stage III ([place])   . Complete heart block (HCC)    a. s/p STJ dual chamber pacemaker 1999; upgrade to CRTP 2018  . Constipation   . COPD (chronic obstructive pulmonary disease) ([place])   . Diverticulosis   . Dyspnea   . Glaucoma   . HLD (hyperlipidemia)   . HTN (hypertension)   . Iritis   . Joint pain   . Leg edema   . NICM (nonischemic cardiomyopathy) ([place])    echo 1/11: EF 35-40%, mod LVH, Grade 1 diast dysfxn, mild BAE;    cath 2/06: luminal irregs;  Myoview 10/2:  no scar or ischemia, EF 37%   . OSA (obstructive sleep apnea) 08/21/2016   "never RX'd mask" (04/20/2017)  . Pacemaker    SA node dysfunction; Dr. [name]  . Paroxysmal atrial fibrillation (HCC)   . Rheumatic fever   . Rheumatoid arthritis ([place])    Dr. Trudie Reed  . 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 Trudie Reed  . Stomach ulcer   . Stroke [facility]) ~ 2016   "mini-stroke"; denies residual on 04/20/2017)  . Syncope 05/2011   PRESYNCOPE  . Tuberculosis 1950s/1960s?    PAST SURGICAL HISTORY: Past Surgical History:  Procedure Laterality Date  . BIV UPGRADE N/A 04/20/2017   Procedure: BiV Upgrade;  Surgeon: Deboraha Sprang, MD;  Location: [place] [facility];  Service: Cardiovascular;  Laterality: N/A;  . CARDIAC CATHETERIZATION    . CARDIOVERSION N/A 10/18/2018   Procedure: CARDIOVERSION;  Surgeon: Deboraha Sprang, MD;  Location: [place] [facility];  Service:  Cardiovascular;  Laterality: N/A;  . CESAREAN SECTION    . colonoscopy  11/17/2017   polyps, repeat 5 years Dr. Therisa Doyne  . EYE SURGERY    . GLAUCOMA SURGERY Right   . INSERT / REPLACE / [place]; 12/26/13   STJ dual chamber pacemaker implanted 1999; gen change 12/2013 by Dr [name] - STJ   . PERMANENT PACEMAKER GENERATOR CHANGE N/A 01/02/2014   Procedure: PERMANENT PACEMAKER GENERATOR CHANGE;  Surgeon: Deboraha Sprang, MD;  [facility];  Service: Cardiovascular;  Laterality:  N/A;  . TUBAL LIGATION    . VAGINAL HYSTERECTOMY      SOCIAL HISTORY: Social History   Tobacco Use  . Smoking status: Former Smoker    Packs/day: 0.50    Years: 8.00    Pack years: 4.00    Types: Cigarettes    Last attempt to quit: 10/06/1988    Years since quitting: 30.1  . Smokeless tobacco: Never Used  Substance Use Topics  . Alcohol use: No  . Drug use: No    FAMILY HISTORY: Family History  Problem Relation Age of Onset  . Heart disease Father   . Asthma Father   . Stroke Father   . Alcoholism Father   . Rheum arthritis Mother   . Stroke Mother   . Diabetes Sister   . Colon cancer Brother   . Colon cancer Brother   . Coronary artery disease Sister   . Coronary artery disease Sister    ROS: Review of Systems  Constitutional: Negative for weight loss.  Endo/Heme/Allergies:       Positive for anemia.   PHYSICAL EXAM: Blood pressure 104/74, pulse 83, temperature 98 F (36.7 C), temperature source Oral, height 5\' 3"  (1.6 m), weight 179 lb (81.2 kg), SpO2 99 %. Body mass index is 31.71 kg/m. Physical Exam Vitals signs reviewed.  Constitutional:      Appearance: Normal appearance. She is obese.  Cardiovascular:     Rate and Rhythm: Normal rate.     Pulses: Normal pulses.  Pulmonary:     Effort: Pulmonary effort is normal.     Breath sounds: Normal breath sounds.  Musculoskeletal: Normal range of motion.  Skin:    General: Skin is warm and dry.  Neurological:      Mental Status: She is alert and oriented to person, place, and time.  Psychiatric:        Behavior: Behavior normal.   RECENT LABS AND TESTS: BMET    Component Value Date/Time   NA 143 10/15/2018 1653   K 4.6 10/15/2018 1653   CL 104 10/15/2018 1653   CO2 23 10/15/2018 1653   GLUCOSE 84 10/15/2018 1653   GLUCOSE 74 03/16/2018 1827   BUN 36 (H) 10/15/2018 1653   CREATININE 2.26 (H) 10/15/2018 1653   CREATININE 1.82 (H) 03/24/2016 0806   CALCIUM 10.1 10/15/2018 1653   GFRNONAA 21 (L) 10/15/2018 1653   GFRAA 25 (L) 10/15/2018 1653   Lab Results  Component Value Date   HGBA1C 5.5 08/12/2018   Lab Results  Component Value Date   INSULIN 14.6 08/12/2018   CBC    Component Value Date/Time   WBC 4.1 10/15/2018 1653   WBC 3.6 (L) 03/16/2018 1827   RBC 3.79 10/15/2018 1653   RBC 3.63 (L) 03/16/2018 1827   HGB 11.3 10/15/2018 1653   HCT 34.4 10/15/2018 1653   PLT 199 10/15/2018 1653   MCV 91 10/15/2018 1653   MCH 29.8 10/15/2018 1653   MCH 29.5 03/16/2018 1827   MCHC 32.8 10/15/2018 1653   MCHC 31.5 03/16/2018 1827   RDW 12.4 10/15/2018 1653   LYMPHSABS 0.6 (L) 08/12/2018 0958   MONOABS 0.4 03/16/2018 1827   EOSABS 0.0 08/12/2018 0958   BASOSABS 0.0 08/12/2018 0958   Iron/TIBC/Ferritin/ %Sat    Component Value Date/Time   IRON 76 05/24/2011 0240   TIBC 336 05/24/2011 0240   FERRITIN 176 05/24/2011 0240   IRONPCTSAT 23 05/24/2011 0240   Lipid Panel     Component Value Date/Time  CHOL 223 (H) 08/12/2018 0958   TRIG 123 08/12/2018 0958   HDL 60 08/12/2018 0958   LDLCALC 138 (H) 08/12/2018 0958   Hepatic Function Panel     Component Value Date/Time   PROT 6.8 08/12/2018 0958   ALBUMIN 4.2 08/12/2018 0958   AST 26 08/12/2018 0958   ALT 15 08/12/2018 0958   ALKPHOS 77 08/12/2018 0958   BILITOT 0.5 08/12/2018 0958      Component Value Date/Time   TSH 3.020 08/12/2018 0958   TSH 2.540 02/18/2017 1043   TSH 2.271 05/24/2011 0240   Results for SELEENA, REIMERS (MRN [id number]) as of 11/17/2018 15:33  Ref. Range 08/12/2018 09:58  Vitamin D, 25-Hydroxy Latest Ref Range: 30.0 - 100.0 ng/mL 23.3 (L)   OBESITY BEHAVIORAL INTERVENTION VISIT  Today's visit was #7  Starting weight: 183 lbs Starting date: 08/12/2018 Today's weight: 179 lbs Today's date: 11/17/2018 Total lbs lost to date: 4 At least 15 minutes were spent on discussing the following behavioral intervention visit.  ASK: We discussed the diagnosis of obesity with [name] today and [name] agreed to give [place] permission to discuss obesity behavioral modification therapy today.  ASSESS: [name] has the diagnosis of obesity and her BMI today is 31.71. [name] is in the action stage of change.   ADVISE: [name] was educated on the multiple health risks of obesity as well as the benefit of weight loss to improve her health. She was advised of the need for long term treatment and the importance of lifestyle modifications to improve her current health and to decrease her risk of future health problems.  AGREE: Multiple dietary modification options and treatment options were discussed and  [name] agreed to follow the recommendations documented in the above note.  ARRANGE: [name] was educated on the importance of frequent visits to treat obesity as outlined per CMS and USPSTF guidelines and agreed to schedule her next follow up appointment today.  [name], am acting as Location manager for [employer], FNP-C.  I have reviewed the above documentation for accuracy and completeness, and I agree with the above.  - Semaje Kinker, FNP-C.

## 2018-11-18 ENCOUNTER — Encounter (INDEPENDENT_AMBULATORY_CARE_PROVIDER_SITE_OTHER): Payer: Self-pay | Admitting: Family Medicine

## 2018-11-20 NOTE — Progress Notes (Signed)
Electrophysiology Office Note Date: 11/23/2018  ID:  Brenda, Kline [birth year], MRN [id number]  PCP: Carol Ada, MD Electrophysiologist: [name]  CC: Pacemaker/AT follow-up  [name] is a 73 y.o. female seen today for Dr [name].  She presents today for routine electrophysiology followup.  Since last being seen in our clinic, the patient reports doing very well. She has had rare palpitations. She denies chest pain, dyspnea, PND, orthopnea, nausea, vomiting, dizziness, syncope, edema, weight gain, or early satiety.  Device History: STJ dual chamber PPM implanted 1999 for complete heart block; CRTP upgrade 2018   Past Medical History:  Diagnosis Date  . Anemia   . Arthritis    "all over" (04/20/2017)  . Atrial tachycardia ([place])   . Back pain   . Cardiomyopathy ([place])    due to sarcoid  . Cataract   . CHF (congestive heart failure) ([place])   . CKD (chronic kidney disease), stage III ([place])   . Complete heart block (HCC)    a. s/p STJ dual chamber pacemaker 1999; upgrade to CRTP 2018  . Constipation   . COPD (chronic obstructive pulmonary disease) ([place])   . Diverticulosis   . Dyspnea   . Glaucoma   . HLD (hyperlipidemia)   . HTN (hypertension)   . Iritis   . Joint pain   . Leg edema   . NICM (nonischemic cardiomyopathy) ([place])    echo 1/11: EF 35-40%, mod LVH, Grade 1 diast dysfxn, mild BAE;    cath 2/06: luminal irregs;  Myoview 10/2:  no scar or ischemia, EF 37%   . OSA (obstructive sleep apnea) 08/21/2016   "never RX'd mask" (04/20/2017)  . Pacemaker    SA node dysfunction; Dr. [name]  . Paroxysmal atrial fibrillation (HCC)   . Rheumatic fever   . Rheumatoid arthritis ([place])    Dr. Trudie Reed  . 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 Trudie Reed  . Stomach ulcer   . Stroke [facility]) ~ 2016   "mini-stroke"; denies residual on 04/20/2017)  . Syncope 05/2011   PRESYNCOPE  . Tuberculosis 1950s/1960s?   Past Surgical History:    Procedure Laterality Date  . BIV UPGRADE N/A 04/20/2017   Procedure: BiV Upgrade;  Surgeon: Deboraha Sprang, MD;  Location: [place] [facility];  Service: Cardiovascular;  Laterality: N/A;  . CARDIAC CATHETERIZATION    . CARDIOVERSION N/A 10/18/2018   Procedure: CARDIOVERSION;  Surgeon: Deboraha Sprang, MD;  Location: [place] [facility];  Service: Cardiovascular;  Laterality: N/A;  . CESAREAN SECTION    . colonoscopy  11/17/2017   polyps, repeat 5 years Dr. Therisa Doyne  . EYE SURGERY    . GLAUCOMA SURGERY Right   . INSERT / REPLACE / [place]; 12/26/13   STJ dual chamber pacemaker implanted 1999; gen change 12/2013 by Dr [name] - STJ   . PERMANENT PACEMAKER GENERATOR CHANGE N/A 01/02/2014   Procedure: PERMANENT PACEMAKER GENERATOR CHANGE;  Surgeon: Deboraha Sprang, MD;  Location: [facility] CATH LAB;  Service: Cardiovascular;  Laterality: N/A;  . TUBAL LIGATION    . VAGINAL HYSTERECTOMY      Current Outpatient Medications  Medication Sig Dispense Refill  . amiodarone (PACERONE) 200 MG tablet Take 2 tablets (400 mg total) by mouth 2 (two) times daily for 14 days, THEN 2 tablets (400 mg total) daily for 14 days, THEN 1 tablet (200 mg total) daily. 444 tablet 0  . bimatoprost (LUMIGAN) 0.01 %  SOLN Place 1 drop into the right eye at bedtime.     . carvedilol (COREG) 25 MG tablet TAKE 1 TABLET BY MOUTH TWICE DAILY WITH A MEAL 180 tablet 3  . cycloSPORINE (RESTASIS) 0.05 % ophthalmic emulsion Place 1 drop into the right eye 2 (two) times daily.     Marland Kitchen ELIQUIS 5 MG TABS tablet TAKE 1 TABLET BY MOUTH TWICE DAILY 60 tablet 3  . ezetimibe (ZETIA) 10 MG tablet Take 10 mg by mouth daily at 2 PM.     . famotidine (PEPCID) 20 MG tablet TAKE 1 TABLET BY MOUTH TWICE DAILY 60 tablet 6  . furosemide (LASIX) 20 MG tablet Take 1 tablet (20 mg total) by mouth daily. TAKE 1 TABLET BY MOUTH EVERY DAY FOR SWELLING AND SHORTNESS OF BREATH 90 tablet 3  . hydroxychloroquine (PLAQUENIL) 200 MG tablet Take 200 mg by mouth  2 (two) times daily.     Javier Docker Oil 300 MG CAPS Take 1 capsule by mouth daily.     Marland Kitchen loteprednol (LOTEMAX) 0.5 % ophthalmic suspension Place 1 drop into both eyes 4 (four) times daily.     . Multiple Vitamins-Minerals (PRESERVISION/LUTEIN) CAPS Take 1 capsule by mouth 2 (two) times daily. In the morning & afternoon    . nitroGLYCERIN (NITROSTAT) 0.4 MG SL tablet Place 0.4 mg under the tongue every 5 (five) minutes as needed for chest pain. For chest pain    . amiodarone (PACERONE) 200 MG tablet Take 2 tablets (400 mg total) by mouth 2 (two) times daily for 14 days, THEN 2 tablets (400 mg total) daily for 14 days. (Patient not taking: Reported on 11/23/2018) 84 tablet 0   No current facility-administered medications for this visit.     Allergies:   Morphine and related; Codeine; Methotrexate derivatives; and Penicillins   Social History: Social History   Socioeconomic History  . Marital status: Legally Separated    Spouse name: Not on file  . Number of children: 5  . Years of education: Not on file  . Highest education level: Some college, no degree  Occupational History  . Occupation: Retired  Scientific laboratory technician  . Financial resource strain: Not on file  . Food insecurity:    Worry: Not on file    Inability: Not on file  . Transportation needs:    Medical: Not on file    Non-medical: Not on file  Tobacco Use  . Smoking status: Former Smoker    Packs/day: 0.50    Years: 8.00    Pack years: 4.00    Types: Cigarettes    Last attempt to quit: 10/06/1988    Years since quitting: 30.1  . Smokeless tobacco: Never Used  Substance and Sexual Activity  . Alcohol use: No  . Drug use: No  . Sexual activity: Never  Lifestyle  . Physical activity:    Days per week: Not on file    Minutes per session: Not on file  . Stress: Not on file  Relationships  . Social connections:    Talks on phone: Not on file    Gets together: Not on file    Attends religious service: Not on file    Active  member of club or organization: Not on file    Attends meetings of clubs or organizations: Not on file    Relationship status: Not on file  . Intimate partner violence:    Fear of current or ex partner: Not on file    Emotionally  abused: Not on file    Physically abused: Not on file    Forced sexual activity: Not on file  Other Topics Concern  . Not on file  Social History Narrative   Retired.    Lives at home alone   Right handed   Caffeine: none     Family History: Family History  Problem Relation Age of Onset  . Heart disease Father   . Asthma Father   . Stroke Father   . Alcoholism Father   . Rheum arthritis Mother   . Stroke Mother   . Diabetes Sister   . Colon cancer Brother   . Colon cancer Brother   . Coronary artery disease Sister   . Coronary artery disease Sister      Review of Systems: All other systems reviewed and are otherwise negative except as noted above.    Physical Exam: VS:  BP 128/62   Pulse 84   Ht 5\' 3"  (1.6 m)   Wt 183 lb (83 kg)   BMI 32.42 kg/m  , BMI Body mass index is 32.42 kg/m.  GEN- The patient is well appearing, alert and oriented x 3 today.   HEENT: normocephalic, atraumatic; sclera clear, conjunctiva pink; hearing intact; oropharynx clear; neck supple  Lungs- Clear to ausculation bilaterally, normal work of breathing.  No wheezes, rales, rhonchi Heart- Regular rate and rhythm  GI- soft, non-tender, non-distended, bowel sounds present  Extremities- no clubbing, cyanosis, or edema  MS- no significant deformity or atrophy Skin- warm and dry, no rash or lesion; PPM pocket well healed Psych- euthymic mood, full affect Neuro- strength and sensation are intact  PPM Interrogation- reviewed in detail today,  See PACEART report  EKG:  EKG is not ordered today.  Recent Labs: 08/12/2018: ALT 15; TSH 3.020 10/15/2018: BUN 36; Creatinine, Ser 2.26; Hemoglobin 11.3; Platelets 199; Potassium 4.6; Sodium 143   Wt Readings from Last 3  Encounters:  11/23/18 183 lb (83 kg)  11/17/18 179 lb (81.2 kg)  11/02/18 179 lb (81.2 kg)     Other studies Reviewed: Additional studies/ records that were reviewed today include: Dr Olin Pia office notes   Assessment and Plan:  1.  Complete heart block Normal PPM function See Pace Art report No changes today  2.  Chronic systolic heart failure Stable No change required today  3.  Persistent atrial tachycardia/atrial fibrillation Maintaining SR post DCCV Continue Eliquis No bleeding issues Will need amio labs at next office visit   Current medicines are reviewed at length with the patient today.   The patient does not have concerns regarding her medicines.  The following changes were made today:  none  Labs/ tests ordered today include: none No orders of the defined types were placed in this encounter.    Disposition:   Follow up with Delilah Shan, Dr [name] 6 months     Signed, Chanetta Marshall, NP 11/23/2018 12:11 PM  [place] [id number] [phone] (office) [phone] (fax)

## 2018-11-23 ENCOUNTER — Ambulatory Visit (INDEPENDENT_AMBULATORY_CARE_PROVIDER_SITE_OTHER): Payer: Medicare Other | Admitting: Nurse Practitioner

## 2018-11-23 VITALS — BP 128/62 | HR 84 | Ht 63.0 in | Wt 183.0 lb

## 2018-11-23 DIAGNOSIS — I442 Atrioventricular block, complete: Secondary | ICD-10-CM | POA: Diagnosis not present

## 2018-11-23 DIAGNOSIS — I471 Supraventricular tachycardia: Secondary | ICD-10-CM | POA: Diagnosis not present

## 2018-11-23 DIAGNOSIS — I5022 Chronic systolic (congestive) heart failure: Secondary | ICD-10-CM

## 2018-11-23 NOTE — Patient Instructions (Addendum)
Medication Instructions:   Your physician recommends that you continue on your current medications as directed. Please refer to the Current Medication list given to you today.  If you need a refill on your cardiac medications before your next appointment, please call your pharmacy.   Lab work:  NONE ORDERED  TODAY   If you have labs (blood work) drawn today and your tests are completely normal, you will receive your results only by: Marland Kitchen MyChart Message (if you have MyChart) OR . A paper copy in the mail If you have any lab test that is abnormal or we need to change your treatment, we will call you to review the results.  Testing/Procedures:NONE ORDERED  TODAY    Follow-Up: Your physician wants you to follow-up in:  IN [place] will receive a reminder letter in the mail two months in advance. If you don't receive a letter, please call our office to schedule the follow-up appointment.  Remote monitoring is used to monitor your Pacemaker of ICD from home. This monitoring reduces the number of office visits required to check your device to one time per year. It allows [place] to keep an eye on the functioning of your device to ensure it is working properly. You are scheduled for a device check from home on . 11-25-18 You may send your transmission at any time that day. If you have a wireless device, the transmission will be sent automatically. After your physician reviews your transmission, you will receive a postcard with your next transmission date.     Any Other Special Instructions Will Be Listed Below (If Applicable).

## 2018-11-25 ENCOUNTER — Ambulatory Visit (INDEPENDENT_AMBULATORY_CARE_PROVIDER_SITE_OTHER): Payer: Medicare Other

## 2018-11-25 DIAGNOSIS — I442 Atrioventricular block, complete: Secondary | ICD-10-CM

## 2018-11-25 DIAGNOSIS — I495 Sick sinus syndrome: Secondary | ICD-10-CM

## 2018-11-29 LAB — CUP PACEART REMOTE DEVICE CHECK
Battery Remaining Longevity: 68 mo
Battery Remaining Percentage: 89 %
Battery Voltage: 2.95 V
Brady Statistic AP VP Percent: 91 %
Brady Statistic AS VP Percent: 8.7 %
Brady Statistic AS VS Percent: 0 %
Brady Statistic RA Percent Paced: 91 %
Date Time Interrogation Session: 20200223042744
Implantable Lead Implant Date: 19990414
Implantable Lead Implant Date: 19990414
Implantable Lead Implant Date: 20180716
Implantable Lead Location: 753858
Implantable Lead Location: 753859
Implantable Lead Location: 753860
Implantable Pulse Generator Implant Date: 20150330
Lead Channel Impedance Value: 330 Ohm
Lead Channel Impedance Value: 440 Ohm
Lead Channel Pacing Threshold Amplitude: 0.75 V
Lead Channel Pacing Threshold Amplitude: 1.375 V
Lead Channel Pacing Threshold Pulse Width: 0.4 ms
Lead Channel Pacing Threshold Pulse Width: 0.5 ms
Lead Channel Pacing Threshold Pulse Width: 0.6 ms
Lead Channel Sensing Intrinsic Amplitude: 5 mV
Lead Channel Setting Pacing Amplitude: 2 V
Lead Channel Setting Pacing Amplitude: 2.125
Lead Channel Setting Pacing Amplitude: 2.375
Lead Channel Setting Pacing Pulse Width: 0.5 ms
Lead Channel Setting Pacing Pulse Width: 0.6 ms
Lead Channel Setting Sensing Sensitivity: 4 mV
MDC IDC MSMT LEADCHNL LV IMPEDANCE VALUE: 1300 Ohm
MDC IDC MSMT LEADCHNL RV PACING THRESHOLD AMPLITUDE: 1.375 V
MDC IDC MSMT LEADCHNL RV SENSING INTR AMPL: 12 mV
MDC IDC STAT BRADY AP VS PERCENT: 0 %
Pulse Gen Model: 3242
Pulse Gen Serial Number: 7610861

## 2018-12-02 NOTE — Progress Notes (Signed)
Remote pacemaker transmission.   

## 2018-12-03 ENCOUNTER — Encounter: Payer: Self-pay | Admitting: Cardiology

## 2018-12-07 ENCOUNTER — Encounter (INDEPENDENT_AMBULATORY_CARE_PROVIDER_SITE_OTHER): Payer: Self-pay | Admitting: Family Medicine

## 2018-12-07 ENCOUNTER — Ambulatory Visit (INDEPENDENT_AMBULATORY_CARE_PROVIDER_SITE_OTHER): Payer: Medicare Other | Admitting: Family Medicine

## 2018-12-07 VITALS — BP 114/74 | HR 78 | Temp 98.0°F | Ht 63.0 in | Wt 182.0 lb

## 2018-12-07 DIAGNOSIS — Z6832 Body mass index (BMI) 32.0-32.9, adult: Secondary | ICD-10-CM | POA: Diagnosis not present

## 2018-12-07 DIAGNOSIS — E669 Obesity, unspecified: Secondary | ICD-10-CM | POA: Diagnosis not present

## 2018-12-07 DIAGNOSIS — R0601 Orthopnea: Secondary | ICD-10-CM

## 2018-12-07 DIAGNOSIS — E66811 Obesity, class 1: Secondary | ICD-10-CM

## 2018-12-07 NOTE — Progress Notes (Signed)
Office: [phone]  /  Fax: [phone]   HPI:   Chief Complaint: OBESITY [name] is here to discuss her progress with her obesity treatment plan. She is on the Category 1 plan and is following her eating plan approximately 50% of the time. She states she is exercising 0 minutes 0 times per week. [name] has had a decreased appetite with recent URI and has only been eating soup. Her weight is 182 lb (82.6 kg) today and has had a weight gain of 3 lbs since her last visit. She has lost 1 lb since starting treatment with [place].  Orthopnea [name] has had orthopnea with recent URI but reports that this has improved. She can now lie flat without shortness of breath, although she still reports some shortness of breath. She has CHF.  ASSESSMENT AND PLAN:  Orthopnea  Class 1 obesity with serious comorbidity and body mass index (BMI) of 32.0 to 32.9 in adult, unspecified obesity type  PLAN:  [name] will call Cardiology if shortness of breath does not continue to improve.  Obesity [name] is currently in the action stage of change. As such, her goal is to continue with weight loss efforts. She has agreed to follow the Category 1 plan. We discussed the following Behavioral Modification Strategies today: increasing lean protein intake, increase H20 intake, and planning for success. [name] has not been prescribed exercise at this time. [name] has agreed to follow-up with our clinic in 3 weeks. She was informed of the importance of frequent follow up visits to maximize her success with intensive lifestyle modifications for her multiple health conditions.  ALLERGIES: Allergies  Allergen Reactions   Morphine And Related Anaphylaxis    Throat closing   Codeine Itching   Methotrexate Derivatives     Itchy rash    Penicillins Itching    Has patient had a PCN reaction causing immediate rash, facial/tongue/throat swelling, SOB or lightheadedness with hypotension: No Has  patient had a PCN reaction causing severe rash involving mucus membranes or skin necrosis: No Has patient had a PCN reaction that required hospitalization: No Has patient had a PCN reaction occurring within the last 10 years: No If all of the above answers are "NO", then may proceed with Cephalosporin use.     MEDICATIONS: Current Outpatient Medications on File Prior to Visit  Medication Sig Dispense Refill   amiodarone (PACERONE) 200 MG tablet Take 2 tablets (400 mg total) by mouth 2 (two) times daily for 14 days, THEN 2 tablets (400 mg total) daily for 14 days, THEN 1 tablet (200 mg total) daily. 444 tablet 0   bimatoprost (LUMIGAN) 0.01 % SOLN Place 1 drop into the right eye at bedtime.      carvedilol (COREG) 25 MG tablet TAKE 1 TABLET BY MOUTH TWICE DAILY WITH A MEAL 180 tablet 3   cycloSPORINE (RESTASIS) 0.05 % ophthalmic emulsion Place 1 drop into the right eye 2 (two) times daily.      ELIQUIS 5 MG TABS tablet TAKE 1 TABLET BY MOUTH TWICE DAILY 60 tablet 3   ezetimibe (ZETIA) 10 MG tablet Take 10 mg by mouth daily at 2 PM.      famotidine (PEPCID) 20 MG tablet TAKE 1 TABLET BY MOUTH TWICE DAILY 60 tablet 6   furosemide (LASIX) 20 MG tablet Take 1 tablet (20 mg total) by mouth daily. TAKE 1 TABLET BY MOUTH EVERY DAY FOR SWELLING AND SHORTNESS OF BREATH 90 tablet 3   hydroxychloroquine (PLAQUENIL) 200 MG tablet Take  200 mg by mouth 2 (two) times daily.      Krill Oil 300 MG CAPS Take 1 capsule by mouth daily.      loteprednol (LOTEMAX) 0.5 % ophthalmic suspension Place 1 drop into both eyes 4 (four) times daily.      Multiple Vitamins-Minerals (PRESERVISION/LUTEIN) CAPS Take 1 capsule by mouth 2 (two) times daily. In the morning & afternoon     nitroGLYCERIN (NITROSTAT) 0.4 MG SL tablet Place 0.4 mg under the tongue every 5 (five) minutes as needed for chest pain. For chest pain     amiodarone (PACERONE) 200 MG tablet Take 2 tablets (400 mg total) by mouth 2 (two) times  daily for 14 days, THEN 2 tablets (400 mg total) daily for 14 days. (Patient not taking: Reported on 11/23/2018) 84 tablet 0   No current facility-administered medications on file prior to visit.     PAST MEDICAL HISTORY: Past Medical History:  Diagnosis Date   Anemia    Arthritis    "all over" (04/20/2017)   Atrial tachycardia (HCC)    Back pain    Cardiomyopathy ([place])    due to sarcoid   Cataract    CHF (congestive heart failure) (HCC)    CKD (chronic kidney disease), stage III (HCC)    Complete heart block (HCC)    a. s/p STJ dual chamber pacemaker 1999; upgrade to CRTP 2018   Constipation    COPD (chronic obstructive pulmonary disease) ([place])    Diverticulosis    Dyspnea    Glaucoma    HLD (hyperlipidemia)    HTN (hypertension)    Iritis    Joint pain    Leg edema    NICM (nonischemic cardiomyopathy) ([place])    echo 1/11: EF 35-40%, mod LVH, Grade 1 diast dysfxn, mild BAE;    cath 2/06: luminal irregs;  Myoview 10/2:  no scar or ischemia, EF 37%    OSA (obstructive sleep apnea) 08/21/2016   "never RX'd mask" (04/20/2017)   Pacemaker    SA node dysfunction; Dr. [name]   Paroxysmal atrial fibrillation ([place])    Rheumatic fever    Rheumatoid arthritis ([place])    Dr. 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 Trudie Reed   Stomach ulcer    Stroke [facility]) ~ 2016   "mini-stroke"; denies residual on 04/20/2017)   Syncope 05/2011   PRESYNCOPE   Tuberculosis 1950s/1960s?    PAST SURGICAL HISTORY: Past Surgical History:  Procedure Laterality Date   BIV UPGRADE N/A 04/20/2017   Procedure: BiV Upgrade;  Surgeon: Deboraha Sprang, MD;  Location: [place] [facility];  Service: Cardiovascular;  Laterality: N/A;   CARDIAC CATHETERIZATION     CARDIOVERSION N/A 10/18/2018   Procedure: CARDIOVERSION;  Surgeon: Deboraha Sprang, MD;  Location: [place] [facility];  Service: Cardiovascular;  Laterality: N/A;   CESAREAN  SECTION     colonoscopy  11/17/2017   polyps, repeat 5 years Dr. Therisa Doyne   EYE SURGERY     GLAUCOMA SURGERY Right    INSERT / REPLACE / REMOVE PACEMAKER  1999; 12/26/13   STJ dual chamber pacemaker implanted 1999; gen change 12/2013 by Dr [name] - STJ    PERMANENT PACEMAKER GENERATOR CHANGE N/A 01/02/2014   Procedure: PERMANENT PACEMAKER GENERATOR CHANGE;  Surgeon: Deboraha Sprang, MD;  Location[facility] CATH LAB;  Service: Cardiovascular;  Laterality: N/A;   TUBAL LIGATION     VAGINAL HYSTERECTOMY  SOCIAL HISTORY: Social History   Tobacco Use   Smoking status: Former Smoker    Packs/day: 0.50    Years: 8.00    Pack years: 4.00    Types: Cigarettes    Last attempt to quit: 10/06/1988    Years since quitting: 30.1   Smokeless tobacco: Never Used  Substance Use Topics   Alcohol use: No   Drug use: No    FAMILY HISTORY: Family History  Problem Relation Age of Onset   Heart disease Father    Asthma Father    Stroke Father    Alcoholism Father    Rheum arthritis Mother    Stroke Mother    Diabetes Sister    Colon cancer Brother    Colon cancer Brother    Coronary artery disease Sister    Coronary artery disease Sister    ROS: Review of Systems  Constitutional: Negative for weight loss.  Respiratory: Positive for shortness of breath.   Cardiovascular: Positive for orthopnea.   PHYSICAL EXAM: Blood pressure 114/74, pulse 78, temperature 98 F (36.7 C), temperature source Oral, height 5\' 3"  (1.6 m), weight 182 lb (82.6 kg), SpO2 97 %. Body mass index is 32.24 kg/m. Physical Exam Vitals signs reviewed.  Constitutional:      Appearance: Normal appearance. She is obese.  Cardiovascular:     Rate and Rhythm: Normal rate.     Pulses: Normal pulses.  Pulmonary:     Breath sounds: Normal breath sounds.     Comments: Mild SOB. Musculoskeletal: Normal range of motion.  Skin:    General: Skin is warm and dry.  Neurological:     Mental Status: She is  alert and oriented to person, place, and time.  Psychiatric:        Behavior: Behavior normal.   RECENT LABS AND TESTS: BMET    Component Value Date/Time   NA 143 10/15/2018 1653   K 4.6 10/15/2018 1653   CL 104 10/15/2018 1653   CO2 23 10/15/2018 1653   GLUCOSE 84 10/15/2018 1653   GLUCOSE 74 03/16/2018 1827   BUN 36 (H) 10/15/2018 1653   CREATININE 2.26 (H) 10/15/2018 1653   CREATININE 1.82 (H) 03/24/2016 0806   CALCIUM 10.1 10/15/2018 1653   GFRNONAA 21 (L) 10/15/2018 1653   GFRAA 25 (L) 10/15/2018 1653   Lab Results  Component Value Date   HGBA1C 5.5 08/12/2018   Lab Results  Component Value Date   INSULIN 14.6 08/12/2018   CBC    Component Value Date/Time   WBC 4.1 10/15/2018 1653   WBC 3.6 (L) 03/16/2018 1827   RBC 3.79 10/15/2018 1653   RBC 3.63 (L) 03/16/2018 1827   HGB 11.3 10/15/2018 1653   HCT 34.4 10/15/2018 1653   PLT 199 10/15/2018 1653   MCV 91 10/15/2018 1653   MCH 29.8 10/15/2018 1653   MCH 29.5 03/16/2018 1827   MCHC 32.8 10/15/2018 1653   MCHC 31.5 03/16/2018 1827   RDW 12.4 10/15/2018 1653   LYMPHSABS 0.6 (L) 08/12/2018 0958   MONOABS 0.4 03/16/2018 1827   EOSABS 0.0 08/12/2018 0958   BASOSABS 0.0 08/12/2018 0958   Iron/TIBC/Ferritin/ %Sat    Component Value Date/Time   IRON 76 05/24/2011 0240   TIBC 336 05/24/2011 0240   FERRITIN 176 05/24/2011 0240   IRONPCTSAT 23 05/24/2011 0240   Lipid Panel     Component Value Date/Time   CHOL 223 (H) 08/12/2018 0958   TRIG 123 08/12/2018 0958   HDL 60  08/12/2018 0958   LDLCALC 138 (H) 08/12/2018 0958   Hepatic Function Panel     Component Value Date/Time   PROT 6.8 08/12/2018 0958   ALBUMIN 4.2 08/12/2018 0958   AST 26 08/12/2018 0958   ALT 15 08/12/2018 0958   ALKPHOS 77 08/12/2018 0958   BILITOT 0.5 08/12/2018 0958      Component Value Date/Time   TSH 3.020 08/12/2018 0958   TSH 2.540 02/18/2017 1043   TSH 2.271 05/24/2011 0240    Ref. Range 08/12/2018 09:58  Vitamin D,  25-Hydroxy Latest Ref Range: 30.0 - 100.0 ng/mL 23.3 (L)   OBESITY BEHAVIORAL INTERVENTION VISIT  Today's visit was #8  Starting weight: 183 lbs Starting date: 08/12/2018 Today's weight: 182 lbs  Today's date: 12/07/2018 Total lbs lost to date: 1 At least 15 minutes were spent on discussing the following behavioral intervention visit.    12/07/2018  Height 5\' 3"  (1.6 m)  Weight 182 lb (82.6 kg)  BMI (Calculated) 32.25  BLOOD PRESSURE - SYSTOLIC 678  BLOOD PRESSURE - DIASTOLIC 74   Body Fat % 45 %  Total Body Water (lbs) 69.8 lbs   ASK: We discussed the diagnosis of obesity with [place] today and [name] agreed to give [place] permission to discuss obesity behavioral modification therapy today.  ASSESS: [name] has the diagnosis of obesity and her BMI today is 32.25. [name] is in the action stage of change.   ADVISE: [name] was educated on the multiple health risks of obesity as well as the benefit of weight loss to improve her health. She was advised of the need for long term treatment and the importance of lifestyle modifications to improve her current health and to decrease her risk of future health problems.  AGREE: Multiple dietary modification options and treatment options were discussed and  [name] agreed to follow the recommendations documented in the above note.  ARRANGE: [name] was educated on the importance of frequent visits to treat obesity as outlined per CMS and USPSTF guidelines and agreed to schedule her next follow up appointment today.  IMichaelene Song, am acting as Location manager for [employer], FNP-C.  I have reviewed the above documentation for accuracy and completeness, and I agree with the above.  - Dawn Whitmire, FNP-C.

## 2018-12-27 ENCOUNTER — Ambulatory Visit (INDEPENDENT_AMBULATORY_CARE_PROVIDER_SITE_OTHER): Payer: Medicare Other

## 2018-12-27 ENCOUNTER — Other Ambulatory Visit: Payer: Self-pay

## 2018-12-27 DIAGNOSIS — I5022 Chronic systolic (congestive) heart failure: Secondary | ICD-10-CM | POA: Diagnosis not present

## 2018-12-27 DIAGNOSIS — Z950 Presence of cardiac pacemaker: Secondary | ICD-10-CM | POA: Diagnosis not present

## 2018-12-27 NOTE — Progress Notes (Signed)
EPIC Encounter for ICM Monitoring  Patient Name: [name] is a 74 y.o. female Date: 12/27/2018 Primary Care Physican: Carol Ada, MD Primary Cardiologist:Klein Electrophysiologist:Klein Bi-V Pacing:>99% Last Weight:190lbs Today's Weight: unknown   Heart failure questions reviewed. She has shortness of breath at night during decreased impedance.  She self adjusts Furosemide and takes extra tablet for couple of days which resolves the shortness of breath.    Thoracic impedance abnormal suggesting fluid accumulation since 12/20/2018 but starting to trend toward baseline.   Prescribed:Furosemide20 mg take 1 tablet daily  Labs: 08/12/2018 Creatinine 2.32, BUN 24, Potassium 4.5, Sodium 138, GFR 21-24 03/16/2018 Creatinine 2.37, BUN 34, Potassium 4.5, Sodium 141, GFR 20-23 04/13/2017 Creatinine 2.17, BUN 23, Potassium 4.8, Sodium 143  Recommendations: Advised to limit salt intake and she does read the food labels to help eliminate high salty foods.  Advised to call if condition worsens.  Follow-up plan: ICM clinic phone appointment on3/31/2020 to recheck fluid levels.  Copy of ICM check sent to [place] for review.   3 month ICM trend: 12/27/2018    1 Year ICM trend:       Brenda Billings, RN 12/27/2018 12:19 PM

## 2018-12-28 ENCOUNTER — Ambulatory Visit (INDEPENDENT_AMBULATORY_CARE_PROVIDER_SITE_OTHER): Payer: Medicare Other | Admitting: Family Medicine

## 2018-12-28 ENCOUNTER — Other Ambulatory Visit: Payer: Self-pay | Admitting: Nurse Practitioner

## 2018-12-28 ENCOUNTER — Encounter (INDEPENDENT_AMBULATORY_CARE_PROVIDER_SITE_OTHER): Payer: Self-pay

## 2018-12-30 ENCOUNTER — Encounter (INDEPENDENT_AMBULATORY_CARE_PROVIDER_SITE_OTHER): Payer: Self-pay

## 2019-01-03 ENCOUNTER — Telehealth: Payer: Self-pay

## 2019-01-03 NOTE — Telephone Encounter (Signed)
Reviewed 01/03/2019 transmission.  [place] thoracic impedance suggests fluid accumulation since 12/22/2018.  Patient reports belly swelling, slight swelling of ankles, sleeping on additional pillows at night to breath but has not helped, no shortness of breath at rest during the day but is [name] of breath when doing daily routine during the day. Overall she is not feeling well.    She confirmed she takes Furosemide 20 mg daily.    10/15/2018 Creatinine 2.26, BUN 36, Potassium 4.6, Sodium 143, GFR 21-25 08/12/2018 Creatinine 2.32, BUN 24, Potassium 4.5, Sodium 138, GFR 21-24 03/16/2018 Creatinine 2.37, BUN 34, Potassium 4.5, Sodium 141, GFR 20-23  Will send copy to Dr [name] for review and recommendations if needed.  01/03/2019

## 2019-01-03 NOTE — Telephone Encounter (Addendum)
Returned patient call as requested by voice mail message.  She reports shortness of breath has worsened since 12/27/2018 call and remote transmission. She has not taken any extra Furosemide in the last week.  She does not weight at home but has a scale.  Advised to weigh daily with same clothes before eating and drinking to determine if she is gaining fluid weight.  She said she will send in remote transmission for review today.  Advised will call her back after transmission is reviewed.

## 2019-01-03 NOTE — Telephone Encounter (Signed)
Call to patient.  Advised Dr [name] recommended Furosemide 60 mg x 3 days and then return to 20 mg daily.  Advised if symptoms worsen to either call office or use ER if urgent.  Encouraged to weigh daily and record.  Will recheck fluid levels and symptoms on 4/1.

## 2019-01-03 NOTE — Telephone Encounter (Signed)
Lets try 60 daily for 3 days

## 2019-01-05 ENCOUNTER — Other Ambulatory Visit: Payer: Self-pay

## 2019-01-05 ENCOUNTER — Ambulatory Visit (INDEPENDENT_AMBULATORY_CARE_PROVIDER_SITE_OTHER): Payer: Medicare Other

## 2019-01-05 DIAGNOSIS — Z950 Presence of cardiac pacemaker: Secondary | ICD-10-CM

## 2019-01-05 DIAGNOSIS — I5022 Chronic systolic (congestive) heart failure: Secondary | ICD-10-CM

## 2019-01-05 NOTE — Progress Notes (Signed)
EPIC Encounter for ICM Monitoring  Patient Name: [name] is a 73 y.o. female Date: 01/05/2019 Primary Care Physican: Carol Ada, MD Primary Cardiologist:Klein Electrophysiologist:Klein Bi-V Pacing:>99% Last Weight:190lbs Today's Weight: she does not weigh at home   Heart failure questions reviewed. Symptoms of shortness of breath during daily routine and lying down at night, abdominal and ankle swelling have improved.  She is on the 2nd day of 3 of increasing Lasix to 60 mg as recommended by Dr [name] (see 01/03/2019 phone note) She reports good urine out put with the 60 mg daily.  After tomorrow she will return to 20 mg daily.  Thoracic impedance returned to normal  Prescribed:Furosemide20 mg take 1 tablet daily  Labs: 08/12/2018 Creatinine 2.32, BUN 24, Potassium 4.5, Sodium 138, GFR 21-24 03/16/2018 Creatinine 2.37, BUN 34, Potassium 4.5, Sodium 141, GFR 20-23 04/13/2017 Creatinine 2.17, BUN 23, Potassium 4.8, Sodium 143  Recommendations: Advised to call if condition worsens.  Follow-up plan: ICM clinic phone appointment on 01/10/2019 to recheck fluid levels.  Copy of ICM check sent to [place] for review.   3 month ICM trend: 01/05/2019    1 Year ICM trend:       [name], RN 01/05/2019 3:23 PM

## 2019-01-10 ENCOUNTER — Telehealth: Payer: Self-pay

## 2019-01-10 ENCOUNTER — Other Ambulatory Visit: Payer: Self-pay

## 2019-01-10 ENCOUNTER — Ambulatory Visit (INDEPENDENT_AMBULATORY_CARE_PROVIDER_SITE_OTHER): Payer: Medicare Other

## 2019-01-10 DIAGNOSIS — Z950 Presence of cardiac pacemaker: Secondary | ICD-10-CM

## 2019-01-10 DIAGNOSIS — I5022 Chronic systolic (congestive) heart failure: Secondary | ICD-10-CM

## 2019-01-10 MED ORDER — AMIODARONE HCL 200 MG PO TABS: ORAL_TABLET | ORAL | Status: DC

## 2019-01-10 NOTE — Telephone Encounter (Signed)
I spoke with the patient regarding complaints of potential side effects associated with amiodarone.  The patient was stated on an amiodarone loading dose in January.  She has recently decreased amiodarone to 200 mg once daily in the last few weeks.  Per the patient, she has complaints of constipation since starting the medication, but in the last 2 weeks, she has had problems with her coordination. She states she has had 2 episodes where she almost fell over while walking. She denies any dizziness associated with these episodes.   She has not been checking her HR's but does have a PPM. Her BP has been running ~ 120's/70's.   Per [employer], constipation and issues with coordination can be potential side effects of this medication.  I advised the patient I will need to forward to Dr. [name] to review and advise and call her back.  The patient voices understanding and is agreeable.

## 2019-01-10 NOTE — Progress Notes (Signed)
EPIC Encounter for ICM Monitoring  Patient Name: [name] is a 73 y.o. female Date: 01/10/2019 Primary Care Physican: Carol Ada, MD Primary Cardiologist:Klein Electrophysiologist:Klein Bi-V Pacing:>99% Last Weight:190lbs Today's Weight: she does not weigh at home   Heart failure questions reviewed.  She said fluid symptoms of shortness at breath, belly and ankle swelling have resolved since taking the extra dosage os 60 mg as prescribed.  She has returned to taking Lasix 20 mg daily.  Thoracic impedancenormal  Prescribed:Furosemide20 mg take 1 tablet daily  Labs: 08/12/2018 Creatinine 2.32, BUN 24, Potassium 4.5, Sodium 138, GFR 21-24 03/16/2018 Creatinine 2.37, BUN 34, Potassium 4.5, Sodium 141, GFR 20-23 04/13/2017 Creatinine 2.17, BUN 23, Potassium 4.8, Sodium 143  Recommendations:Advised to if she develops any further fluid symptoms.  Follow-up plan: ICM clinic phone appointment on 01/31/2019.  Copy of ICM check sent to [place]..  Direct Trend View:  01/08/2019     [name], RN 01/10/2019 12:32 PM

## 2019-01-10 NOTE — Telephone Encounter (Signed)
Lets drop amio 100 daily And telehealth next week

## 2019-01-10 NOTE — Telephone Encounter (Signed)
ICM call to patient today.  She said she would like to talk with Dr Olin Pia nurse to discuss Amiodarone.  She said she feels like she may be having side effects but nothing urgent.  Advised I would send message to his nurse, Nira Conn for further assistance and to answer questions about Amiodarone.

## 2019-01-10 NOTE — Telephone Encounter (Signed)
I spoke with the patient regarding Dr. Olin Pia recommendations to decrease amiodarone 200 mg- take 1/2 tablet (100 mg) by mouth once daily. She is aware he would like to do a video / phone visit with her next week.  The patient voices understanding of the above and is agreeable. She is scheduled for a VIDEO VISIT with Dr. [name] on Monday 4/13 at 9:30 am.   She is aware to call the office back sooner if needed.    Virtual Visit Pre-Appointment Phone Call  Steps For Call:  1. Confirm consent - "In the setting of the current Covid19 crisis, you are scheduled for a (phone or video) visit with your provider on (date) at (time).  Just as we do with many in-office visits, in order for you to participate in this visit, we must obtain consent.  If you'd like, I can send this to your mychart (if signed up) or email for you to review.  Otherwise, I can obtain your verbal consent now.  All virtual visits are billed to your insurance company just like a normal visit would be.  By agreeing to a virtual visit, we'd like you to understand that the technology does not allow for your provider to perform an examination, and thus may limit your provider's ability to fully assess your condition.  Finally, though the technology is pretty good, we cannot assure that it will always work on either your or our end, and in the setting of a video visit, we may have to convert it to a phone-only visit.  In either situation, we cannot ensure that we have a secure connection.  Are you willing to proceed?"     TELEPHONE CALL NOTE  [name] has been deemed a candidate for a follow-up tele-health visit to limit community exposure during the Covid-19 pandemic. I spoke with the patient via phone to ensure availability of phone/video source, confirm preferred email & phone number, and discuss instructions and expectations.  I reminded [name] to be prepared with any vital sign and/or heart rhythm information that  could potentially be obtained via home monitoring, at the time of her visit. I reminded [name] to expect a phone call at the time of her visit if her visit.  Did the patient verbally acknowledge consent to treatment? Verbal Consent obtained  Alvis Lemmings, RN 01/10/2019 4:26 PM

## 2019-01-17 ENCOUNTER — Telehealth (INDEPENDENT_AMBULATORY_CARE_PROVIDER_SITE_OTHER): Payer: Medicare Other | Admitting: Internal Medicine

## 2019-01-17 ENCOUNTER — Other Ambulatory Visit: Payer: Self-pay

## 2019-01-17 VITALS — Ht 63.0 in | Wt 188.8 lb

## 2019-01-17 DIAGNOSIS — Z950 Presence of cardiac pacemaker: Secondary | ICD-10-CM

## 2019-01-17 DIAGNOSIS — I5022 Chronic systolic (congestive) heart failure: Secondary | ICD-10-CM

## 2019-01-17 DIAGNOSIS — I442 Atrioventricular block, complete: Secondary | ICD-10-CM | POA: Diagnosis not present

## 2019-01-17 DIAGNOSIS — I495 Sick sinus syndrome: Secondary | ICD-10-CM

## 2019-01-17 MED ORDER — FUROSEMIDE 40 MG PO TABS: 40.0000 mg | ORAL_TABLET | Freq: Every day | ORAL | 3 refills | Status: DC

## 2019-01-17 NOTE — Progress Notes (Signed)
Electrophysiology TeleHealth Note   Due to national recommendations of social distancing due to COVID 19, an audio/video telehealth visit is felt to be most appropriate for this patient at this time.  See MyChart message from today for the patient's consent to telehealth for [facility].  Because of technical difficulties have been unable to use video phone     Date:  01/17/2019   ID:  Brenda, Kline 1946/08/16, MRN [id number]  Location: patient's home  Provider location: [address], [place]  Evaluation Performed: Follow-up visit  PCP:  Carol Ada, MD  Cardiologist:  Virl Axe, MD   Electrophysiologist:  SK  Chief Complaint:  *SOB and dizziness   History of Present Illness:    [name] is a 73 y.o. female who presents via audio/video conferencing for a telehealth visit today.  Since last being seen in our clinic, the patient reports problems with volume overload.  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  She also has a history of atrial arrhythmias and a TIA for which she takes apixoban,  SCAF was noted 5/17. Episode duration was 12 hours or so.  Because of ongoing problems with congestive heart failure she underwent CRT upgrade 7/18.   DATE TEST EF   4/13 Echo  35-40 %   3/15 Echo 40-45 %   5/18 Echo 25-30%   1/19 Echo 40-45%     Date Cr K TSH LFTs Mg Hgb  6/15  2.3 4.5       6/17  1.82* 5.2       4/18  1.91 5.1   2.3 10.8  7/18 2.17 4.8      11/19 2.32 4.5 3.02 15  11.1  1/20 2.26 4.6        Because of Atrial fib recurring, she was started on amio and now with complaints of constipation and incoordination.  Amio downtitrated   Now on 100 mg; still constipated-- using metamucil with some relief.  Balance is better   PND last week with stable 2 pillow orthopnea; edema none-abdominal swelling Improved with up titration of diuretics; with resumption of normal dosing, dyspnea has  returned.  She has known renal insufficiency grade 3-4.   The patient denies symptoms of fevers, chills, cough,   worrisome for COVID 19.    Past Medical History:  Diagnosis Date  . Anemia   . Arthritis    "all over" (04/20/2017)  . Atrial tachycardia ([place])   . Back pain   . Cardiomyopathy ([place])    due to sarcoid  . Cataract   . CHF (congestive heart failure) ([place])   . CKD (chronic kidney disease), stage III ([place])   . Complete heart block (HCC)    a. s/p STJ dual chamber pacemaker 1999; upgrade to CRTP 2018  . Constipation   . COPD (chronic obstructive pulmonary disease) ([place])   . Diverticulosis   . Dyspnea   . Glaucoma   . HLD (hyperlipidemia)   . HTN (hypertension)   . Iritis   . Joint pain   . Leg edema   . NICM (nonischemic cardiomyopathy) ([place])    echo 1/11: EF 35-40%, mod LVH, Grade 1 diast dysfxn, mild BAE;    cath 2/06: luminal irregs;  Myoview 10/2:  no scar or ischemia, EF 37%   . OSA (obstructive sleep apnea) 08/21/2016   "never RX'd mask" (04/20/2017)  . Pacemaker    SA node dysfunction; Dr. [name]  .  Paroxysmal atrial fibrillation (HCC)   . Rheumatic fever   . Rheumatoid arthritis ([place])    Dr. Trudie Reed  . 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 Trudie Reed  . Stomach ulcer   . Stroke [facility]) ~ 2016   "mini-stroke"; denies residual on 04/20/2017)  . Syncope 05/2011   PRESYNCOPE  . Tuberculosis 1950s/1960s?    Past Surgical History:  Procedure Laterality Date  . BIV UPGRADE N/A 04/20/2017   Procedure: BiV Upgrade;  Surgeon: Deboraha Sprang, MD;  Location: [facility];  Service: Cardiovascular;  Laterality: N/A;  . CARDIAC CATHETERIZATION    . CARDIOVERSION N/A 10/18/2018   Procedure: CARDIOVERSION;  Surgeon: Deboraha Sprang, MD;  Location: [facility];  Service: Cardiovascular;  Laterality: N/A;  . CESAREAN SECTION    . colonoscopy  11/17/2017   polyps, repeat 5 years Dr. Therisa Doyne  . EYE SURGERY    . GLAUCOMA  SURGERY Right   . INSERT / REPLACE / [place]; 12/26/13   STJ dual chamber pacemaker implanted 1999; gen change 12/2013 by Dr [name] - STJ   . PERMANENT PACEMAKER GENERATOR CHANGE N/A 01/02/2014   Procedure: PERMANENT PACEMAKER GENERATOR CHANGE;  Surgeon: Deboraha Sprang, MD;  Location: [facility] CATH LAB;  Service: Cardiovascular;  Laterality: N/A;  . TUBAL LIGATION    . VAGINAL HYSTERECTOMY      Current Outpatient Medications  Medication Sig Dispense Refill  . amiodarone (PACERONE) 200 MG tablet Take 1/2 tablet (100 mg) by mouth once daily    . bimatoprost (LUMIGAN) 0.01 % SOLN Place 1 drop into the right eye at bedtime.     . carvedilol (COREG) 25 MG tablet TAKE 1 TABLET BY MOUTH TWICE DAILY WITH A MEAL 180 tablet 3  . cycloSPORINE (RESTASIS) 0.05 % ophthalmic emulsion Place 1 drop into the right eye 2 (two) times daily.     Marland Kitchen ELIQUIS 5 MG TABS tablet TAKE 1 TABLET BY MOUTH TWICE DAILY 60 tablet 3  . ezetimibe (ZETIA) 10 MG tablet Take 10 mg by mouth daily at 2 PM.     . famotidine (PEPCID) 20 MG tablet TAKE 1 TABLET BY MOUTH TWICE DAILY 60 tablet 6  . hydroxychloroquine (PLAQUENIL) 200 MG tablet Take 200 mg by mouth 2 (two) times daily.     Javier Docker Oil 300 MG CAPS Take 1 capsule by mouth daily.     Marland Kitchen loteprednol (LOTEMAX) 0.5 % ophthalmic suspension Place 1 drop into both eyes 4 (four) times daily.     . nitroGLYCERIN (NITROSTAT) 0.4 MG SL tablet Place 0.4 mg under the tongue every 5 (five) minutes as needed for chest pain. For chest pain    . furosemide (LASIX) 40 MG tablet Take 1 tablet (40 mg total) by mouth daily. 90 tablet 3  . Multiple Vitamins-Minerals (PRESERVISION/LUTEIN) CAPS Take 1 capsule by mouth 2 (two) times daily. In the morning & afternoon     No current facility-administered medications for this visit.     Allergies:   Morphine and related; Codeine; Methotrexate derivatives; and Penicillins   Social History:  The patient  reports that she quit smoking about 30  years ago. Her smoking use included cigarettes. She has a 4.00 pack-year smoking history. She has never used smokeless tobacco. She reports that she does not drink alcohol or use drugs.   Family History:  The patient's   family history includes Alcoholism in her father; Asthma in her  father; Colon cancer in her brother and brother; Coronary artery disease in her sister and sister; Diabetes in her sister; Heart disease in her father; Rheum arthritis in her mother; Stroke in her father and mother.   ROS:  Please see the history of present illness.   All other systems are personally reviewed and negative.    Exam:    Vital Signs:  Ht 5\' 3"  (1.6 m)   Wt 188 lb 12.8 oz (85.6 kg)   BMI 33.44 kg/m     Well appearing, alert and conversant, regular work of breathing,  good skin color Eyes- anicteric, neuro- grossly intact, skin- no apparent rash or lesions or cyanosis, mouth- oral mucosa is pink   Labs/Other Tests and Data Reviewed:    Recent Labs: 08/12/2018: ALT 15; TSH 3.020 10/15/2018: BUN 36; Creatinine, Ser 2.26; Hemoglobin 11.3; Platelets 199; Potassium 4.6; Sodium 143   Wt Readings from Last 3 Encounters:  01/17/19 188 lb 12.8 oz (85.6 kg)  12/07/18 182 lb (82.6 kg)  11/23/18 183 lb (83 kg)     Other studies personally reviewed:      Last device remote is reviewed from [place] PDF dated which reveals normal device function  Atrial arrhtyhmias with return of sinus   ASSESSMENT & PLAN:    Nonischemic cardiomyopathy   Sarcoid heart disease   Atrial fibrillation //SCAF   Atrial tachycardia-cycle length 360 ms   Complete heart block   Renal Insufficiency  Grade 4   Sinus node dysfunction  Congestive Heart failure acute chronic systolic/diastolic grade 3   Pacemaker CRT  St Jude    Maintaining sinus on amio  Will increase furosemide >> 60 x 3 days And then 40 mg days  Will need BMET in about 1-2 weeks  Has appt with renal on 4/28 and will get blood work  then   Continue amiodarone    COVID 19 screen The patient denies symptoms of COVID 19 at this time.  The importance of social distancing was discussed today.  Follow-up:  48mo Next remote: next week w [facility]   Current medicines are reviewed at length with the patient today.   The patient has concerns regarding her medicines.  The following changes were made today: As above   Labs/ tests ordered today include: CMET and TSH  No orders of the defined types were placed in this encounter.   Future tests ( post COVID )   As Below   Patient Risk:  after full review of this patients clinical status, I feel that they are at moderate risk at this time.  Today, I have spent 26 minutes with the patient with telehealth technology discussing the above.  Signed, Virl Axe, MD  01/17/2019 12:54 PM     [place] [address] [place] [address] [phone] (office) [phone] (fax)

## 2019-01-28 ENCOUNTER — Other Ambulatory Visit: Payer: Self-pay | Admitting: Internal Medicine

## 2019-01-28 NOTE — Telephone Encounter (Signed)
Pt last saw Dr [name] 01/17/19 Telemedicine COVID-19, last labs 10/15/18 Creat 2.26, age 74, weight 85.6kg, based on specified criteria pt is on appropriate dosage of Eliquis 5mg  BID.  Will refill rx.

## 2019-01-28 NOTE — Telephone Encounter (Signed)
Pt is a 6 yr. Female who saw Dr. [name]  On 01/17/19 via Telemed due to Covid-19. Her last noted weight was 82.6Kg on 12/07/18. SCr on 10/15/18 was 2.26. Will refill Eliquis 5mg  BID per criteria.

## 2019-01-31 ENCOUNTER — Ambulatory Visit (INDEPENDENT_AMBULATORY_CARE_PROVIDER_SITE_OTHER): Payer: Medicare Other

## 2019-01-31 ENCOUNTER — Other Ambulatory Visit: Payer: Self-pay

## 2019-01-31 DIAGNOSIS — I5022 Chronic systolic (congestive) heart failure: Secondary | ICD-10-CM

## 2019-01-31 DIAGNOSIS — Z950 Presence of cardiac pacemaker: Secondary | ICD-10-CM | POA: Diagnosis not present

## 2019-02-01 NOTE — Progress Notes (Signed)
EPIC Encounter for ICM Monitoring  Patient Name: [name] is a 73 y.o. female Date: 02/01/2019 Primary Care Physican: Carol Ada, MD Primary Cardiologist:Klein Electrophysiologist:Klein Bi-V Pacing:>99% Last Weight:190lbs Today's Weight:she does not weigh at home   Heart failure questions reviewed and she is asymptomatic.  Thoracic impedancenormal  Prescribed:Furosemide20 mg take 1 tablet daily  Labs: 08/12/2018 Creatinine 2.32, BUN 24, Potassium 4.5, Sodium 138, GFR 21-24 03/16/2018 Creatinine 2.37, BUN 34, Potassium 4.5, Sodium 141, GFR 20-23 04/13/2017 Creatinine 2.17, BUN 23, Potassium 4.8, Sodium 143  Recommendations: No changes and encouraged to call for fluid symptoms.   Follow-up plan: ICM clinic phone appointment on6/10/2018.  Copy of ICM check sent to [place].   3 month ICM trend: 01/31/2019    1 Year ICM trend:       [name], RN 02/01/2019 8:34 AM

## 2019-02-09 ENCOUNTER — Other Ambulatory Visit: Payer: Self-pay | Admitting: Internal Medicine

## 2019-02-09 NOTE — Telephone Encounter (Signed)
Pt last saw Dr [name] 01/17/19 telemedicine visit COVID-19, last labs 10/15/18 Creat 2.26, age 73, weight 85.6kg, based on specified criteria pt is on appropriate dosage of Eliquis 5mg  BID.  Will refill rx.

## 2019-02-24 ENCOUNTER — Ambulatory Visit (INDEPENDENT_AMBULATORY_CARE_PROVIDER_SITE_OTHER): Payer: Medicare Other | Admitting: *Deleted

## 2019-02-24 DIAGNOSIS — I495 Sick sinus syndrome: Secondary | ICD-10-CM | POA: Diagnosis not present

## 2019-02-24 LAB — CUP PACEART REMOTE DEVICE CHECK
Battery Remaining Longevity: 60 mo
Battery Remaining Percentage: 80 %
Battery Voltage: 2.93 V
Brady Statistic RA Percent Paced: 99 %
Brady Statistic RV Percent Paced: 99 %
Date Time Interrogation Session: 20200521105903
Implantable Lead Implant Date: 19990414
Implantable Lead Implant Date: 19990414
Implantable Lead Implant Date: 20180716
Implantable Lead Location: 753858
Implantable Lead Location: 753859
Implantable Lead Location: 753860
Implantable Pulse Generator Implant Date: 20150330
Lead Channel Impedance Value: 1225 Ohm
Lead Channel Impedance Value: 430 Ohm
Lead Channel Pacing Threshold Amplitude: 1.25 V
Lead Channel Pacing Threshold Amplitude: 1.625 V
Lead Channel Pacing Threshold Pulse Width: 0.5 ms
Lead Channel Pacing Threshold Pulse Width: 0.6 ms
Lead Channel Setting Pacing Amplitude: 2 V
Lead Channel Setting Pacing Amplitude: 2.125
Lead Channel Setting Pacing Amplitude: 2.75 V
Lead Channel Setting Pacing Pulse Width: 0.5 ms
Lead Channel Setting Pacing Pulse Width: 0.6 ms
Lead Channel Setting Sensing Sensitivity: 4 mV
Pulse Gen Model: 3242
Pulse Gen Serial Number: 7610861

## 2019-03-04 ENCOUNTER — Encounter: Payer: Self-pay | Admitting: Cardiology

## 2019-03-04 NOTE — Progress Notes (Signed)
Remote pacemaker transmission.   

## 2019-03-07 ENCOUNTER — Ambulatory Visit (INDEPENDENT_AMBULATORY_CARE_PROVIDER_SITE_OTHER): Payer: Medicare Other

## 2019-03-07 DIAGNOSIS — I5022 Chronic systolic (congestive) heart failure: Secondary | ICD-10-CM

## 2019-03-07 DIAGNOSIS — Z950 Presence of cardiac pacemaker: Secondary | ICD-10-CM | POA: Diagnosis not present

## 2019-03-08 NOTE — Progress Notes (Signed)
EPIC Encounter for ICM Monitoring  Patient Name: [name] is a 73 y.o. female Date: 03/08/2019 Primary Care Physican: Carol Ada, MD Primary Cardiologist:Klein Electrophysiologist:Klein Bi-V Pacing:>99% Last Weight:190lbs 03/09/2019 Weight: 185 lbs    Heart failure questions reviewed and she is asymptomatic but weight did increase during decreased impedance.  [place] Thoracic impedancenormal but was abnormal suggesting fluid accumulation 5/10-5/18.  Prescribed:Furosemide20 mg take 1 tablet daily  Labs: 08/12/2018 Creatinine 2.32, BUN 24, Potassium 4.5, Sodium 138, GFR 21-24 03/16/2018 Creatinine 2.37, BUN 34, Potassium 4.5, Sodium 141, GFR 20-23 04/13/2017 Creatinine 2.17, BUN 23, Potassium 4.8, Sodium 143  Recommendations: No changes and encouraged to call for fluid symptoms.   Follow-up plan: ICM clinic phone appointment on7/03/2019.  Copy of ICM check sent to [place].    3 month ICM trend: 03/07/2019    1 Year ICM trend:       [name], RN 03/08/2019 9:58 AM

## 2019-04-13 NOTE — Progress Notes (Signed)
No ICM remote transmission received for 04/11/2019 and next ICM transmission scheduled for 05/09/2019.

## 2019-05-09 ENCOUNTER — Ambulatory Visit (INDEPENDENT_AMBULATORY_CARE_PROVIDER_SITE_OTHER): Payer: Medicare Other

## 2019-05-09 ENCOUNTER — Telehealth: Payer: Self-pay

## 2019-05-09 DIAGNOSIS — Z950 Presence of cardiac pacemaker: Secondary | ICD-10-CM

## 2019-05-09 DIAGNOSIS — I5022 Chronic systolic (congestive) heart failure: Secondary | ICD-10-CM

## 2019-05-09 NOTE — Progress Notes (Signed)
EPIC Encounter for ICM Monitoring  Patient Name: [name] is a 73 y.o. female Date: 05/09/2019 Primary Care Physican: Carol Ada, MD Primary Cardiologist:Klein Electrophysiologist:Klein Bi-V Pacing:>99% 03/09/2019 Weight: 185 lbs 05/09/2019 Weight: 183 lbs        Heart failure questions reviewed and she was SOB and had to sit up at night to sleep this past Friday and Saturday night.  She has felt better since yesterday and orthopnea has resolved.  Corvue Thoracic impedancesuggestive of possible ongoing fluid accumulation since 05/06/2019.  Prescribed:Furosemide20 mg take 1 tablet daily.    Labs: 02/01/2019 Creatinine 2.77, BUN 39, Potassium 4.5, Sodium 144, GFR 17-19 10/15/2018 Creatinine 2.26, BUN 36, Potassium 4.6, Sodium 143, GFR 21-25  Recommendations: Phone note sent to Dr [name] for review.  If any recommendations will call her.  She should limit salt intake and call back if condition worsens.  Follow-up plan: ICM clinic phone appointment on8/08/2019 to recheck fluid levels  Copy of ICM check sent to [place].     AT/AF    3 month ICM trend: 05/09/2019    1 Year ICM trend:       [name], RN 05/09/2019 9:48 AM

## 2019-05-09 NOTE — Telephone Encounter (Signed)
Spoke with patient for ICM monthly follow up.   She reports abdominal bloating unable to lie down Friday and Saturday night to sleep.  Orthopnea resolved yesterday.   [place] Thoracic impedancesuggestive of possible ongoing fluid accumulation since 05/06/2019.  She takes Furosemide20 mg take 1 tablet daily.    Labs: 02/01/2019 Creatinine 2.77, BUN 39, Potassium 4.5, Sodium 144, GFR 17-19 10/15/2018 Creatinine 2.26, BUN 36, Potassium 4.6, Sodium 143, GFR 21-25  Recommendations: Advised will call her back if Dr Olin Pia makes any recommendations. Advised to limit salt intake and call back if condition worsens.  Next remote transmission8/08/2019 to recheck fluid levels  3 month ICM trend: 05/09/2019

## 2019-05-09 NOTE — Progress Notes (Signed)
Call to patient and advised I spoke with Dr [name].  He advised patient take Furosemide 40 mg x 1 dose tomorrow and if no increase in urination then take 80 mg x 1 dose on Wednesday.  After Wednesday she should return to taking prescribed dosage of 20 mg daily.  She has an appointment with Dr Johnney Ou at [facility] 05/24/2019 (last visit was 01/2019).  Advised to discuss Furosemide dosage with nephrologist at next appt and ask for recommendations regarding taking extra Furosemide when she has fluid symptoms.   Will recheck fluid levels 05/13/2019 (manual send)

## 2019-05-09 NOTE — Telephone Encounter (Signed)
Call to patient and advised I spoke with Dr [name].  He advised patient take Furosemide 40 mg x 1 dose tomorrow and if no increase in urination then take 80 mg x 1 dose on Wednesday.  After Wednesday she should return to taking prescribed dosage of 20 mg daily.  She has an appointment with Dr Johnney Ou at [facility] 05/24/2019 (last visit was 01/2019).  Advised to discuss Furosemide dosage with nephrologist at next appt and ask for recommendations regarding taking extra Furosemide when she has fluid symptoms.

## 2019-05-13 ENCOUNTER — Ambulatory Visit (INDEPENDENT_AMBULATORY_CARE_PROVIDER_SITE_OTHER): Payer: Medicare Other

## 2019-05-13 DIAGNOSIS — I5022 Chronic systolic (congestive) heart failure: Secondary | ICD-10-CM

## 2019-05-13 DIAGNOSIS — Z950 Presence of cardiac pacemaker: Secondary | ICD-10-CM

## 2019-05-13 NOTE — Progress Notes (Signed)
EPIC Encounter for ICM Monitoring  Patient Name: [name] is a 73 y.o. female Date: 05/13/2019 Primary Care Physican: Brenda Ada, MD Primary [facility]: [name] Bi-V Pacing:>99% 03/09/2019 Weight: 185 lbs 05/09/2019 Weight: 183 lbs 05/13/2019 Weight: 183 lbs        Heart failure questions reviewed.  She did improve for a couple of days but then last night she was SOB and sat up in chair to sleep.          She said she took 40 mg for 2 days instead of following Brenda [name] instruction to take 40 mg x 1 day and if no increase in urination then follow up with next day with 80 mg dosage.  She has resumed taking prescribed dose of 20 mg daily.   CorvueThoracic impedanceimproved with extra Lasix dosage. Impedance decreased again starting 8/6 suggesting possible fluid accumulation is occurring again.  Prescribed:Furosemide20 mg take 1 tablet daily.    Labs: 02/01/2019 Creatinine 2.77, BUN 39, Potassium 4.5, Sodium 144, GFR 17-19 10/15/2018 Creatinine 2.26, BUN 36, Potassium 4.6, Sodium 143, GFR 21-25  Recommendations: Advised to use ER if condition worsens.  Instructed to call [facility] office on Monday to discuss fluid accumulation and how much lasix can she take.  Appt with [facility] 8/18.  Follow-up plan: ICM clinic phone appointment on8/08/2019 to recheck fluid levels  Copy of ICM check sent to [place].   3 month ICM trend: 05/10/2019    1 Year ICM trend:       Brenda Billings, RN 05/13/2019 4:12 PM

## 2019-05-17 ENCOUNTER — Ambulatory Visit (INDEPENDENT_AMBULATORY_CARE_PROVIDER_SITE_OTHER): Payer: Medicare Other

## 2019-05-17 DIAGNOSIS — Z950 Presence of cardiac pacemaker: Secondary | ICD-10-CM

## 2019-05-17 DIAGNOSIS — I5022 Chronic systolic (congestive) heart failure: Secondary | ICD-10-CM

## 2019-05-18 NOTE — Progress Notes (Signed)
EPIC Encounter for ICM Monitoring  Patient Name: [name] is a 73 y.o. female Date: 05/18/2019 Primary Care Physican: Brenda Ada, MD Primary [facility]: Dr [name] Bi-V Pacing:>99% 03/09/2019 Weight: 185 lbs 05/09/2019 Weight: 183 lbs 05/13/2019 Weight: 183 lbs   Heart failure questions reviewed.  She feels much better and cut back on the fluid intake.  She has not spoken with kidney physician and will do so at 8/18 appt.  CorvueThoracic impedance returned to normal.   Prescribed:Furosemide20 mg take 1 tablet daily.  Labs: 02/01/2019 Creatinine 2.77, BUN 39, Potassium 4.5, Sodium 144, GFR 17-19 10/15/2018 Creatinine 2.26, BUN 36, Potassium 4.6, Sodium 143, GFR 21-25  Recommendations: No changes and encouraged to call if experiencing any fluid symptoms.  Follow-up plan: ICM clinic phone appointment on10/26/2020 and will have office defib check on 06/30/2019 with Dr [name].   Copy of ICM check sent to [place].  AT/AF   3 month ICM trend: 05/16/2019    1 Year ICM trend:       Brenda Billings, RN 05/18/2019 4:21 PM

## 2019-05-26 ENCOUNTER — Ambulatory Visit (INDEPENDENT_AMBULATORY_CARE_PROVIDER_SITE_OTHER): Payer: Medicare Other | Admitting: *Deleted

## 2019-05-26 DIAGNOSIS — I509 Heart failure, unspecified: Secondary | ICD-10-CM

## 2019-05-26 DIAGNOSIS — I442 Atrioventricular block, complete: Secondary | ICD-10-CM

## 2019-05-26 LAB — CUP PACEART REMOTE DEVICE CHECK
Battery Remaining Longevity: 42 mo
Battery Remaining Percentage: 71 %
Battery Voltage: 2.92 V
Brady Statistic AP VP Percent: 99 %
Brady Statistic AP VS Percent: 1 %
Brady Statistic AS VP Percent: 1 %
Brady Statistic AS VS Percent: 0 %
Brady Statistic RA Percent Paced: 99 %
Brady Statistic RV Percent Paced: 99 %
Date Time Interrogation Session: 20200820094734
Implantable Lead Implant Date: 19990414
Implantable Lead Implant Date: 19990414
Implantable Lead Implant Date: 20180716
Implantable Lead Location: 753858
Implantable Lead Location: 753859
Implantable Lead Location: 753860
Implantable Pulse Generator Implant Date: 20150330
Lead Channel Impedance Value: 1175 Ohm
Lead Channel Impedance Value: 310 Ohm
Lead Channel Impedance Value: 430 Ohm
Lead Channel Pacing Threshold Amplitude: 1.25 V
Lead Channel Pacing Threshold Amplitude: 2 V
Lead Channel Pacing Threshold Pulse Width: 0.5 ms
Lead Channel Pacing Threshold Pulse Width: 0.6 ms
Lead Channel Setting Pacing Amplitude: 2 V
Lead Channel Setting Pacing Amplitude: 2 V
Lead Channel Setting Pacing Amplitude: 3 V
Lead Channel Setting Pacing Pulse Width: 0.5 ms
Lead Channel Setting Pacing Pulse Width: 0.6 ms
Lead Channel Setting Sensing Sensitivity: 4 mV
Pulse Gen Model: 3242
Pulse Gen Serial Number: 7610861

## 2019-06-02 NOTE — Progress Notes (Signed)
Remote pacemaker transmission.   

## 2019-06-30 ENCOUNTER — Telehealth (INDEPENDENT_AMBULATORY_CARE_PROVIDER_SITE_OTHER): Payer: Medicare Other | Admitting: Internal Medicine

## 2019-06-30 ENCOUNTER — Other Ambulatory Visit: Payer: Self-pay

## 2019-06-30 ENCOUNTER — Encounter: Payer: Self-pay | Admitting: Internal Medicine

## 2019-06-30 VITALS — BP 104/71 | HR 104 | Ht 63.0 in | Wt 186.6 lb

## 2019-06-30 DIAGNOSIS — Z79899 Other long term (current) drug therapy: Secondary | ICD-10-CM

## 2019-06-30 DIAGNOSIS — R0600 Dyspnea, unspecified: Secondary | ICD-10-CM | POA: Diagnosis not present

## 2019-06-30 DIAGNOSIS — I442 Atrioventricular block, complete: Secondary | ICD-10-CM | POA: Diagnosis not present

## 2019-06-30 DIAGNOSIS — I5022 Chronic systolic (congestive) heart failure: Secondary | ICD-10-CM

## 2019-06-30 NOTE — Progress Notes (Signed)
Electrophysiology TeleHealth Note   Due to national recommendations of social distancing due to COVID 19, an audio/video telehealth visit is felt to be most appropriate for this patient at this time.  See MyChart message from today for the patient's consent to telehealth for [facility].   Date:  06/30/2019   ID:  [name], [name] 30-Oct-1945, MRN [id number]  Location: patient's home  Provider location: [address], [place]  Evaluation Performed: Follow-up visit  PCP:  Carol Ada, MD  Cardiologist:     Electrophysiologist:  SK   Chief Complaint:  Dyspnea   History of Present Illness:    [name] is a 73 y.o. female who presents via audio/video conferencing for a telehealth visit today.  Since last being seen in our clinic, the patient reports continuing to struggle with sob and edema   Responded to increase diuretics but situation worsened following following return to normal dosing .  Some problems with fluid monitored remotely and managed by ICM clinic W With Worsening SOB-- weight gone up 179>>`186 Edema and orthopnea   DATE TEST EF   4/13 Echo 35-40 %   3/15 Echo 40-45 %   5/18 Echo 25-30%   1/19 Echo 40-45%    Date Cr K TSH LFTs Mg Hgb  6/15  2.3 4.5      6/17  1.82* 5.2      4/18 1.91 5.1   2.3 10.8  7/18 2.17 4.8      11/19 2.32 4.5 3.02 15  11.1  1/20 2.26 4.6      4/20 2.77                Because of Atrial fib recurring, she was started on amio and now with complaints of constipation and incoordination.  Amio downtitrated   Now on 100 mg; still constipated-- using metamucil with some relief.  Balance is better       The patient denies symptoms of fevers, chills, cough, or new SOB worrisome for COVID 19   Past Medical History:  Diagnosis Date  . Anemia   . Arthritis    "all over" (04/20/2017)  . Atrial tachycardia ([place])   . Back pain   . Cardiomyopathy ([place])    due to sarcoid  .  Cataract   . CHF (congestive heart failure) ([place])   . CKD (chronic kidney disease), stage III ([place])   . Complete heart block (HCC)    a. s/p STJ dual chamber pacemaker 1999; upgrade to CRTP 2018  . Constipation   . COPD (chronic obstructive pulmonary disease) ([place])   . Diverticulosis   . Dyspnea   . Glaucoma   . HLD (hyperlipidemia)   . HTN (hypertension)   . Iritis   . Joint pain   . Leg edema   . NICM (nonischemic cardiomyopathy) ([place])    echo 1/11: EF 35-40%, mod LVH, Grade 1 diast dysfxn, mild BAE;    cath 2/06: luminal irregs;  Myoview 10/2:  no scar or ischemia, EF 37%   . OSA (obstructive sleep apnea) 08/21/2016   "never RX'd mask" (04/20/2017)  . Pacemaker    SA node dysfunction; Dr. [name]  . Paroxysmal atrial fibrillation (HCC)   . Rheumatic fever   . Rheumatoid arthritis ([place])    Dr. Trudie Reed  . 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 Trudie Reed  . Stomach ulcer   . Stroke [facility]) ~ 2016   "  mini-stroke"; denies residual on 04/20/2017)  . Syncope 05/2011   PRESYNCOPE  . Tuberculosis 1950s/1960s?    Past Surgical History:  Procedure Laterality Date  . BIV UPGRADE N/A 04/20/2017   Procedure: BiV Upgrade;  Surgeon: Deboraha Sprang, MD;  Location: [place] CV LAB;  Service: Cardiovascular;  Laterality: N/A;  . CARDIAC CATHETERIZATION    . CARDIOVERSION N/A 10/18/2018   Procedure: CARDIOVERSION;  Surgeon: Deboraha Sprang, MD;  Location: [place] CV LAB;  Service: Cardiovascular;  Laterality: N/A;  . CESAREAN SECTION    . colonoscopy  11/17/2017   polyps, repeat 5 years Dr. Therisa Doyne  . EYE SURGERY    . GLAUCOMA SURGERY Right   . INSERT / REPLACE / [place]; 12/26/13   STJ dual chamber pacemaker implanted 1999; gen change 12/2013 by Dr [name] - STJ   . PERMANENT PACEMAKER GENERATOR CHANGE N/A 01/02/2014   Procedure: PERMANENT PACEMAKER GENERATOR CHANGE;  Surgeon: Deboraha Sprang, MD;  Location: [facility] CATH LAB;  Service:  Cardiovascular;  Laterality: N/A;  . TUBAL LIGATION    . VAGINAL HYSTERECTOMY      Current Outpatient Medications  Medication Sig Dispense Refill  . amiodarone (PACERONE) 200 MG tablet Take 1/2 tablet (100 mg) by mouth once daily    . bimatoprost (LUMIGAN) 0.01 % SOLN Place 1 drop into the right eye at bedtime.     . carvedilol (COREG) 25 MG tablet TAKE 1 TABLET BY MOUTH TWICE DAILY WITH A MEAL 180 tablet 3  . cycloSPORINE (RESTASIS) 0.05 % ophthalmic emulsion Place 1 drop into the right eye 2 (two) times daily.     Marland Kitchen ELIQUIS 5 MG TABS tablet TAKE 1 TABLET BY MOUTH TWICE DAILY 60 tablet 6  . ezetimibe (ZETIA) 10 MG tablet Take 10 mg by mouth daily at 2 PM.     . famotidine (PEPCID) 20 MG tablet TAKE 1 TABLET BY MOUTH TWICE DAILY 60 tablet 6  . furosemide (LASIX) 40 MG tablet Take 1 tablet (40 mg total) by mouth daily. (Patient taking differently: Take 20 mg by mouth daily. ) 90 tablet 3  . hydroxychloroquine (PLAQUENIL) 200 MG tablet Take 200 mg by mouth 2 (two) times daily.     Marland Kitchen loteprednol (LOTEMAX) 0.5 % ophthalmic suspension Place 1 drop into both eyes 4 (four) times daily.     . Multiple Vitamins-Minerals (PRESERVISION/LUTEIN) CAPS Take 1 capsule by mouth 2 (two) times daily. In the morning & afternoon    . nitroGLYCERIN (NITROSTAT) 0.4 MG SL tablet Place 0.4 mg under the tongue every 5 (five) minutes as needed for chest pain. For chest pain    . Krill Oil 300 MG CAPS Take 1 capsule by mouth daily.      No current facility-administered medications for this visit.     Allergies:   Morphine and related, Codeine, Methotrexate derivatives, and Penicillins   Social History:  The patient  reports that she quit smoking about 30 years ago. Her smoking use included cigarettes. She has a 4.00 pack-year smoking history. She has never used smokeless tobacco. She reports that she does not drink alcohol or use drugs.   Family History:  The patient's   family history includes Alcoholism in her  father; Asthma in her father; Colon cancer in her brother and brother; Coronary artery disease in her sister and sister; Diabetes in her sister; Heart disease in her father; Rheum arthritis in her mother; Stroke in her father and mother.   ROS:  Please see the history of present illness.   All other systems are personally reviewed and negative.    Exam:    Vital Signs:  BP 104/71   Pulse (!) 104   Ht 5\' 3"  (1.6 m)   Wt 186 lb 9.6 oz (84.6 kg)   BMI 33.05 kg/m      Labs/Other Tests and Data Reviewed:    Recent Labs: 08/12/2018: ALT 15; TSH 3.020 10/15/2018: BUN 36; Creatinine, Ser 2.26; Hemoglobin 11.3; Platelets 199; Potassium 4.6; Sodium 143   Wt Readings from Last 3 Encounters:  06/30/19 186 lb 9.6 oz (84.6 kg)  01/17/19 188 lb 12.8 oz (85.6 kg)  12/07/18 182 lb (82.6 kg)     Other studies personally reviewed:    Last device remote is reviewed from [place] PDF dated 8/20 which reveals normal device function,   arrhythmias - none   There is auto reprogramming of the RV output to 3.o mV   ASSESSMENT & PLAN:   Nonischemic cardiomyopathy   Sarcoid heart disease   Atrial fibrillation //SCAF  Atrial tachycardia-cycle length 360 ms   Complete heart block   Renal Insufficiency Grade 4   Sinus node dysfunction  Congestive Heart failure acute/ chronic systolic/diastolic grade 3   Pacemaker CRT St Jude   With worsening dyspnea and weight gain will increase her diuretics with some trepidation  She doesn't know what her last Cr was but was told it had imprvoed persumably from the 2.77 4/20  Will have her take furosemide 40 qd x 3d -- then 40 alt with 20  Will have to check CMET ajd TSH  next week   Her weight edema and intrathoracic impedance makes amio pulmonary toxicity unlikely  With worsening CHF will check echo   COVID 19 screen The patient denies symptoms of COVID 19 at this time.  The importance of social distancing was discussed today.   Follow-up:  41m Next remote ,As Scheduled   Current medicines are reviewed at length with the patient today.   The patient does not have concerns regarding her medicines.  The following changes were made today:   As above   Labs/ tests ordered today include:As above  Echo  "CHF" No orders of the defined types were placed in this encounter.   Future tests ( post COVID )     Patient Risk:  after full review of this patients clinical status, I feel that they are at moderate risk at this time.  Today, I have spent 15  minutes with the patient with telehealth technology discussing the above.  Signed, Virl Axe, MD  06/30/2019 3:15 PM     [address] [address] [phone] (office) [phone] (fax)

## 2019-06-30 NOTE — Addendum Note (Signed)
Addended by: Stanton Kidney on: 06/30/2019 05:10 PM   Modules accepted: Orders

## 2019-07-04 ENCOUNTER — Other Ambulatory Visit: Payer: Self-pay

## 2019-07-04 ENCOUNTER — Ambulatory Visit (HOSPITAL_COMMUNITY): Payer: Medicare Other | Attending: Internal Medicine

## 2019-07-04 DIAGNOSIS — R0600 Dyspnea, unspecified: Secondary | ICD-10-CM | POA: Insufficient documentation

## 2019-07-04 DIAGNOSIS — I5022 Chronic systolic (congestive) heart failure: Secondary | ICD-10-CM

## 2019-07-04 NOTE — Progress Notes (Signed)
2D Echocardiogram has been performed. [facility] Lakashia Collison RDCS 07/04/2019 2:39pm

## 2019-07-05 ENCOUNTER — Telehealth: Payer: Self-pay | Admitting: *Deleted

## 2019-07-05 NOTE — Telephone Encounter (Signed)
Spoke with patient. She is agreeable to a DC appointment on 07/06/19 at 1:00pm for possible reprogramming. No questions at this time.

## 2019-07-05 NOTE — Telephone Encounter (Signed)
-----   Message from Deboraha Sprang, MD sent at 06/30/2019  5:45 PM EDT ----- Hassell Halim   this ladies auto threshold program seems to have driven her output to 3 V   can we reprogram her output fixed at 2,5 please Thanks SK

## 2019-07-06 ENCOUNTER — Ambulatory Visit (INDEPENDENT_AMBULATORY_CARE_PROVIDER_SITE_OTHER): Payer: Medicare Other | Admitting: Internal Medicine

## 2019-07-06 ENCOUNTER — Encounter: Payer: Self-pay | Admitting: Internal Medicine

## 2019-07-06 ENCOUNTER — Other Ambulatory Visit: Payer: Self-pay

## 2019-07-06 ENCOUNTER — Other Ambulatory Visit: Payer: Medicare Other | Admitting: *Deleted

## 2019-07-06 VITALS — BP 100/72 | HR 76

## 2019-07-06 DIAGNOSIS — I5022 Chronic systolic (congestive) heart failure: Secondary | ICD-10-CM

## 2019-07-06 DIAGNOSIS — I442 Atrioventricular block, complete: Secondary | ICD-10-CM

## 2019-07-06 DIAGNOSIS — R0600 Dyspnea, unspecified: Secondary | ICD-10-CM

## 2019-07-06 DIAGNOSIS — Z950 Presence of cardiac pacemaker: Secondary | ICD-10-CM | POA: Diagnosis not present

## 2019-07-06 DIAGNOSIS — Z79899 Other long term (current) drug therapy: Secondary | ICD-10-CM

## 2019-07-06 MED ORDER — CARVEDILOL 12.5 MG PO TABS: ORAL_TABLET | ORAL | 3 refills | Status: AC

## 2019-07-06 NOTE — Patient Instructions (Addendum)
Medication Instructions:  Your physician has recommended you make the following change in your medication:  1. DECREASE Carvedilol to 12.5 mg twice a day.  * If you need a refill on your cardiac medications before your next appointment, please call your pharmacy.   Labwork: Nira Conn, RN will call you tomorrow with your lab results.

## 2019-07-06 NOTE — Progress Notes (Signed)
Patient Care Team: Carol Ada, MD as PCP - General (Family Medicine) Deboraha Sprang, MD as PCP - Cardiology (Cardiology)   HPI  [name] is a 73 y.o. 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  She also has a history of atrial arrhythmias and a TIA for which she takes apixoban,  SCAF was noted 5/17. Episode duration was 12 hours or so.  Because of ongoing problems with congestive heart failure she underwent CRT upgrade 7/18.   DATE TEST EF   4/13 Echo  35-40 %   3/15 Echo 40-45 %   5/18 Echo 25-30%   1/19 Echo 40-45%   9/20 Echo  25-30% BAE    Date Cr K TSH LFTs Mg Hgb  6/15  2.3 4.5      6/17  1.82* 5.2      4/18 1.91 5.1   2.3 10.8  7/18 2.17 4.8      11/19 2.32 4.5 3.02 15  11.1  1/20 2.26 4.6      4/20 2.77       9/20 2.64 4.8 2.15     She is continue to struggle with dyspnea.  Last week efforts to improve this with increasing her diuretics resulted in no benefit.  Her she has noted her blood pressure at home is in the 90s.  This is quite low for her.  She is brought in today for resynchronization to see if we could improve as interval left ventricular function assessment head showed a significant interval decrease     Past Medical History:  Diagnosis Date  . Anemia   . Arthritis    "all over" (04/20/2017)  . Atrial tachycardia ([place])   . Back pain   . Cardiomyopathy ([place])    due to sarcoid  . Cataract   . CHF (congestive heart failure) ([place])   . CKD (chronic kidney disease), stage III ([place])   . Complete heart block (HCC)    a. s/p STJ dual chamber pacemaker 1999; upgrade to CRTP 2018  . Constipation   . COPD (chronic obstructive pulmonary disease) ([place])   . Diverticulosis   . Dyspnea   . Glaucoma   . HLD (hyperlipidemia)   . HTN (hypertension)   . Iritis   . Joint pain   . Leg edema   . NICM (nonischemic cardiomyopathy)  ([place])    echo 1/11: EF 35-40%, mod LVH, Grade 1 diast dysfxn, mild BAE;    cath 2/06: luminal irregs;  Myoview 10/2:  no scar or ischemia, EF 37%   . OSA (obstructive sleep apnea) 08/21/2016   "never RX'd mask" (04/20/2017)  . Pacemaker    SA node dysfunction; Dr. [name]  . Paroxysmal atrial fibrillation (HCC)   . Rheumatic fever   . Rheumatoid arthritis ([place])    Dr. Trudie Reed  . 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 Trudie Reed  . Stomach ulcer   . Stroke [facility]) ~ 2016   "mini-stroke"; denies residual on 04/20/2017)  . Syncope 05/2011   PRESYNCOPE  . Tuberculosis 1950s/1960s?    Past Surgical History:  Procedure Laterality Date  . BIV UPGRADE N/A 04/20/2017   Procedure: BiV Upgrade;  Surgeon: Deboraha Sprang, MD;  Location: [place] CV LAB;  Service: Cardiovascular;  Laterality: N/A;  . CARDIAC CATHETERIZATION    . CARDIOVERSION N/A 10/18/2018  Procedure: CARDIOVERSION;  Surgeon: Deboraha Sprang, MD;  Location: [place] CV LAB;  Service: Cardiovascular;  Laterality: N/A;  . CESAREAN SECTION    . colonoscopy  11/17/2017   polyps, repeat 5 years Dr. Therisa Doyne  . EYE SURGERY    . GLAUCOMA SURGERY Right   . INSERT / REPLACE / [place]; 12/26/13   STJ dual chamber pacemaker implanted 1999; gen change 12/2013 by Dr [name] - STJ   . PERMANENT PACEMAKER GENERATOR CHANGE N/A 01/02/2014   Procedure: PERMANENT PACEMAKER GENERATOR CHANGE;  Surgeon: Deboraha Sprang, MD;  Location: [facility] CATH LAB;  Service: Cardiovascular;  Laterality: N/A;  . TUBAL LIGATION    . VAGINAL HYSTERECTOMY      Current Outpatient Medications  Medication Sig Dispense Refill  . amiodarone (PACERONE) 200 MG tablet Take 1/2 tablet (100 mg) by mouth once daily    . bimatoprost (LUMIGAN) 0.01 % SOLN Place 1 drop into the right eye at bedtime.     . carvedilol (COREG) 12.5 MG tablet TAKE 1 TABLET BY MOUTH TWICE DAILY WITH A MEAL 180 tablet 3  . Cholecalciferol (VITAMIN D) 50 MCG  (2000 UT) tablet Take 2,000 Units by mouth daily.    . cycloSPORINE (RESTASIS) 0.05 % ophthalmic emulsion Place 1 drop into the right eye 2 (two) times daily.     Marland Kitchen ELIQUIS 5 MG TABS tablet TAKE 1 TABLET BY MOUTH TWICE DAILY 60 tablet 6  . ezetimibe (ZETIA) 10 MG tablet Take 10 mg by mouth daily at 2 PM.     . famotidine (PEPCID) 20 MG tablet TAKE 1 TABLET BY MOUTH TWICE DAILY 60 tablet 6  . furosemide (LASIX) 40 MG tablet Take 1 tablet (40 mg total) by mouth daily. (Patient taking differently: Take 20 mg by mouth daily. ) 90 tablet 3  . hydroxychloroquine (PLAQUENIL) 200 MG tablet Take 200 mg by mouth 2 (two) times daily.     Marland Kitchen loteprednol (LOTEMAX) 0.5 % ophthalmic suspension Place 1 drop into both eyes 4 (four) times daily.     . Multiple Vitamins-Minerals (PRESERVISION/LUTEIN) CAPS Take 1 capsule by mouth 2 (two) times daily. In the morning & afternoon    . nitroGLYCERIN (NITROSTAT) 0.4 MG SL tablet Place 0.4 mg under the tongue every 5 (five) minutes as needed for chest pain. For chest pain    . Krill Oil 300 MG CAPS Take 1 capsule by mouth daily.      No current facility-administered medications for this visit.     Allergies  Allergen Reactions  . Morphine And Related Anaphylaxis    Throat closing  . Codeine Itching  . Methotrexate Derivatives     Itchy rash   . Penicillins Itching    Has patient had a PCN reaction causing immediate rash, facial/tongue/throat swelling, SOB or lightheadedness with hypotension: No Has patient had a PCN reaction causing severe rash involving mucus membranes or skin necrosis: No Has patient had a PCN reaction that required hospitalization: No Has patient had a PCN reaction occurring within the last 10 years: No If all of the above answers are "NO", then may proceed with Cephalosporin use.     Review of Systems negative except from HPI and PMH  Physical Exam BP 100/72 (BP Location: Left Arm, Patient Position: Sitting, Cuff Size: Normal)   Pulse  76   SpO2 96%  Well developed and nourished in no acute distress HENT normal Neck supple with JVP-8-0-10 Clear Regular rate and rhythm, no murmurs or  gallops Abd-soft with active BS No Clubbing cyanosis 1+2+edema Skin-warm and dry A & Oriented  Grossly normal sensory and motor function     ECG demonstrates  Atrial flutter 360 msec with P-synchronous/ AV  pacing  Intervals 23/16/43 with an upright QRS in V1 and negative QRS lead I  Assessment and  Plan  Nonischemic cardiomyopathy   Sarcoid heart disease   Atrial fibrillation //SCAF   Atrial tachycardia-cycle length 360 ms  Hypertension  Complete heart block   Renal Insufficiency  Grade 4   Sinus node dysfunction  Congestive Heart failure acute chronic 3  GI bleeding    Pacemaker  St Jude      No intercurrent atrial fibrillation or flutter .On Anticoagulation;  No bleeding issues   Have reprogrammed her device to improve resynchronization increasing the LV RV offset from 25--80 which resulted in an upright QRS in lead V1 and negative QRS in lead I.  Her QRS duration however remains longer about 180 ms.  Await metabolic profile ordered today prior to make any changes in medications  We spent more than 50% of our >25 min visit in face to face counseling regarding the above    Atrial tachycardia occurs below the mode switch cut off rate.  We will plan to reprogram this following cardioversion.  We attempted to pace terminate the tachycardia today with cycle length down to 160 ms without success.  She will be cardioverted next week.  Euvolemic continue current meds  More than 50% of 40 min was spent in counseling related to the above  Please see how you

## 2019-07-07 ENCOUNTER — Telehealth: Payer: Self-pay | Admitting: *Deleted

## 2019-07-07 LAB — COMPREHENSIVE METABOLIC PANEL
ALT: 27 IU/L (ref 0–32)
AST: 29 IU/L (ref 0–40)
Albumin/Globulin Ratio: 1.5 (ref 1.2–2.2)
Albumin: 3.7 g/dL (ref 3.7–4.7)
Alkaline Phosphatase: 99 IU/L (ref 39–117)
BUN/Creatinine Ratio: 13 (ref 12–28)
BUN: 34 mg/dL — ABNORMAL HIGH (ref 8–27)
Bilirubin Total: 0.4 mg/dL (ref 0.0–1.2)
CO2: 27 mmol/L (ref 20–29)
Calcium: 9.7 mg/dL (ref 8.7–10.3)
Chloride: 105 mmol/L (ref 96–106)
Creatinine, Ser: 2.64 mg/dL — ABNORMAL HIGH (ref 0.57–1.00)
GFR calc Af Amer: 20 mL/min/{1.73_m2} — ABNORMAL LOW (ref >59)
GFR calc non Af Amer: 18 mL/min/{1.73_m2} — ABNORMAL LOW (ref >59)
Globulin, Total: 2.4 g/dL (ref 1.5–4.5)
Glucose: 82 mg/dL (ref 65–99)
Potassium: 4.8 mmol/L (ref 3.5–5.2)
Sodium: 143 mmol/L (ref 134–144)
Total Protein: 6.1 g/dL (ref 6.0–8.5)

## 2019-07-07 LAB — TSH: TSH: 2.15 u[IU]/mL (ref 0.450–4.500)

## 2019-07-07 NOTE — Telephone Encounter (Signed)
-----   Message from Deboraha Sprang, MD sent at 07/07/2019 11:53 AM EDT ----- Nira Conn, could you call her today.  Please.  I spoke with her nephrologist.  The recommendation was to have her increase her Lasix to 80 daily.  And to let us know next week if she is not urinating.  Her office for follow-up and schedule an appointment in the next 4 weeks or so.  If she does urinate with the Lasix 80, she should continue doing it for 5 days and then go back to 40 mg a day.  Thanks

## 2019-07-07 NOTE — Telephone Encounter (Signed)
I called and spoke with the patient regarding his lab results and Dr. Olin Pia recommendations to: 1) Increase lasix to 80 mg once daily x 5 days then resume 40 mg once daily 2) Call in 1 week with how she tolerated the higher dose lasix 3) Follow up with her nephrologist in 4 weeks  The patient voices understanding of the above recommendations.  She states she sees Dr. Meryl Crutch for nephrology.  She does not have any follow up currently scheduled with her, but I have advised her to go ahead and call to set this up. The patient is agreeable.

## 2019-07-08 LAB — CUP PACEART INCLINIC DEVICE CHECK
Battery Remaining Longevity: 39 mo
Battery Voltage: 2.92 V
Brady Statistic RA Percent Paced: 99.37 %
Brady Statistic RV Percent Paced: 99.99 %
Date Time Interrogation Session: 20200930171445
Implantable Lead Implant Date: 19990414
Implantable Lead Implant Date: 19990414
Implantable Lead Implant Date: 20180716
Implantable Lead Location: 753858
Implantable Lead Location: 753859
Implantable Lead Location: 753860
Implantable Pulse Generator Implant Date: 20150330
Lead Channel Impedance Value: 1225 Ohm
Lead Channel Impedance Value: 312.5 Ohm
Lead Channel Impedance Value: 425 Ohm
Lead Channel Pacing Threshold Amplitude: 0.75 V
Lead Channel Pacing Threshold Amplitude: 1.25 V
Lead Channel Pacing Threshold Amplitude: 1.625 V
Lead Channel Pacing Threshold Pulse Width: 0.4 ms
Lead Channel Pacing Threshold Pulse Width: 0.5 ms
Lead Channel Pacing Threshold Pulse Width: 0.6 ms
Lead Channel Setting Pacing Amplitude: 2 V
Lead Channel Setting Pacing Amplitude: 2.5 V
Lead Channel Setting Pacing Amplitude: 2.625
Lead Channel Setting Pacing Pulse Width: 0.5 ms
Lead Channel Setting Pacing Pulse Width: 0.6 ms
Lead Channel Setting Sensing Sensitivity: 4 mV
Pulse Gen Model: 3242
Pulse Gen Serial Number: 7610861

## 2019-07-08 NOTE — Addendum Note (Signed)
Addended by: Mechele Dawley on: 07/08/2019 06:16 PM   Modules accepted: Orders

## 2019-07-26 ENCOUNTER — Other Ambulatory Visit: Payer: Self-pay | Admitting: Internal Medicine

## 2019-07-26 MED ORDER — FUROSEMIDE 40 MG PO TABS: 40.0000 mg | ORAL_TABLET | Freq: Every day | ORAL | 3 refills | Status: AC

## 2019-08-01 ENCOUNTER — Ambulatory Visit (INDEPENDENT_AMBULATORY_CARE_PROVIDER_SITE_OTHER): Payer: Medicare Other

## 2019-08-01 DIAGNOSIS — I5022 Chronic systolic (congestive) heart failure: Secondary | ICD-10-CM

## 2019-08-01 DIAGNOSIS — Z950 Presence of cardiac pacemaker: Secondary | ICD-10-CM | POA: Diagnosis not present

## 2019-08-01 NOTE — Progress Notes (Signed)
EPIC Encounter for ICM Monitoring  Patient Name: [name] is a 73 y.o. female Date: 08/01/2019 Primary Care Physican: Carol Ada, MD Primary [place] [facility]: Dr Johnney Ou Bi-V Pacing:>99% 08/01/2019 Weight: 180 lbs   Heart failure questions reviewed.  She is asymptomatic.  CorvueThoracic impedance normal.   Prescribed:Furosemide40 mg take 1 tablet daily.  Labs: 07/06/2019 Creatinine 2.64, BUN 34, Potassium 4.8, Sodium 143, GFR 18-20 02/01/2019 Creatinine 2.77, BUN 39, Potassium 4.5, Sodium 144, GFR 17-19 10/15/2018 Creatinine 2.26, BUN 36, Potassium 4.6, Sodium 143, GFR 21-25  Recommendations:   Encouraged to call if experiencing fluid symptoms.  Follow-up plan: ICM clinic phone appointment on 09/05/2019.   91 day device clinic remote transmission 08/25/2019.    Copy of ICM check sent to Dr. [name].   3 month ICM trend: 08/01/2019    1 Year ICM trend:       [name], RN 08/01/2019 4:26 PM

## 2019-08-02 ENCOUNTER — Telehealth: Payer: Self-pay | Admitting: Internal Medicine

## 2019-08-02 NOTE — Telephone Encounter (Signed)
Pt advised she can get the Flu shot.  She also reports she missed her remote device check yesterday.. will forward to the device clinic for review.

## 2019-08-02 NOTE — Telephone Encounter (Signed)
Patients states that her PCP recommended that she contact her cardiologist to see if she should have the flu shot.

## 2019-08-02 NOTE — Telephone Encounter (Signed)
Transmission was received, but has not been processed yet.  No further action needed at this time.   Legrand Como [address] [place], PA-C  08/02/2019 1:20 PM

## 2019-08-25 ENCOUNTER — Ambulatory Visit (INDEPENDENT_AMBULATORY_CARE_PROVIDER_SITE_OTHER): Payer: Medicare Other | Admitting: *Deleted

## 2019-08-25 ENCOUNTER — Other Ambulatory Visit: Payer: Self-pay | Admitting: *Deleted

## 2019-08-25 DIAGNOSIS — I442 Atrioventricular block, complete: Secondary | ICD-10-CM

## 2019-08-25 DIAGNOSIS — I509 Heart failure, unspecified: Secondary | ICD-10-CM | POA: Diagnosis not present

## 2019-08-25 LAB — CUP PACEART REMOTE DEVICE CHECK
Battery Remaining Longevity: 31 mo
Battery Remaining Percentage: 56 %
Battery Voltage: 2.89 V
Brady Statistic AP VP Percent: 99 %
Brady Statistic AP VS Percent: 1 %
Brady Statistic AS VP Percent: 1 %
Brady Statistic AS VS Percent: 0 %
Brady Statistic RA Percent Paced: 99 %
Brady Statistic RV Percent Paced: 99 %
Date Time Interrogation Session: 20201119020014
Implantable Lead Implant Date: 19990414
Implantable Lead Implant Date: 19990414
Implantable Lead Implant Date: 20180716
Implantable Lead Location: 753858
Implantable Lead Location: 753859
Implantable Lead Location: 753860
Implantable Pulse Generator Implant Date: 20150330
Lead Channel Impedance Value: 1200 Ohm
Lead Channel Impedance Value: 310 Ohm
Lead Channel Impedance Value: 400 Ohm
Lead Channel Pacing Threshold Amplitude: 0.75 V
Lead Channel Pacing Threshold Amplitude: 1.125 V
Lead Channel Pacing Threshold Amplitude: 1.875 V
Lead Channel Pacing Threshold Pulse Width: 0.4 ms
Lead Channel Pacing Threshold Pulse Width: 0.5 ms
Lead Channel Pacing Threshold Pulse Width: 0.6 ms
Lead Channel Sensing Intrinsic Amplitude: 12 mV
Lead Channel Sensing Intrinsic Amplitude: 5 mV
Lead Channel Setting Pacing Amplitude: 2 V
Lead Channel Setting Pacing Amplitude: 2.5 V
Lead Channel Setting Pacing Amplitude: 2.875
Lead Channel Setting Pacing Pulse Width: 0.5 ms
Lead Channel Setting Pacing Pulse Width: 0.6 ms
Lead Channel Setting Sensing Sensitivity: 4 mV
Pulse Gen Model: 3242
Pulse Gen Serial Number: 7610861

## 2019-08-25 MED ORDER — APIXABAN 5 MG PO TABS: 5.0000 mg | ORAL_TABLET | Freq: Two times a day (BID) | ORAL | 3 refills | Status: DC

## 2019-08-25 NOTE — Telephone Encounter (Signed)
Prescription refill request for Eliquis received.  Last office visit: [name], 07/06/2019 Scr: 2.64, 07/06/2019 Age:  73 y.o. Weight: 82.6 kg  Prescription refill sent.

## 2019-09-05 ENCOUNTER — Ambulatory Visit (INDEPENDENT_AMBULATORY_CARE_PROVIDER_SITE_OTHER): Payer: Medicare Other

## 2019-09-05 DIAGNOSIS — I5022 Chronic systolic (congestive) heart failure: Secondary | ICD-10-CM | POA: Diagnosis not present

## 2019-09-05 DIAGNOSIS — Z950 Presence of cardiac pacemaker: Secondary | ICD-10-CM | POA: Diagnosis not present

## 2019-09-06 NOTE — Progress Notes (Signed)
EPIC Encounter for ICM Monitoring  Patient Name: [name] is a 73 y.o. female Date: 09/06/2019 Primary Care Physican: Carol Ada, MD Primary [place] [facility]: Dr Johnney Ou Bi-V Pacing:>99% 09/06/2019 Weight: 180 lbs   Heart failure questions reviewed.She is asymptomatic for fluid accumulation.  She is feeling fine at this time.  CorvueThoracic impedance normal.  Prescribed:Furosemide40 mg take 1 tablet daily.  Labs: 07/06/2019 Creatinine 2.64, BUN 34, Potassium 4.8, Sodium 143, GFR 18-20 02/01/2019 Creatinine 2.77, BUN 39, Potassium 4.5, Sodium 144, GFR 17-19 10/15/2018 Creatinine 2.26, BUN 36, Potassium 4.6, Sodium 143, GFR 21-25  Recommendations:   Encouraged to call if experiencing fluid symptoms.  Follow-up plan: ICM clinic phone appointment on 10/10/2019.   91 day device clinic remote transmission 11/24/2019.  Office visit with Dr [name] 10/17/2019.  Copy of ICM check sent to Dr. [name].   3 month ICM trend: 09/05/2019    1 Year ICM trend:       [name], RN 09/06/2019 1:51 PM

## 2019-09-23 NOTE — Progress Notes (Signed)
Remote pacemaker transmission.   

## 2019-10-10 ENCOUNTER — Ambulatory Visit (INDEPENDENT_AMBULATORY_CARE_PROVIDER_SITE_OTHER): Payer: Medicare Other

## 2019-10-10 DIAGNOSIS — I5022 Chronic systolic (congestive) heart failure: Secondary | ICD-10-CM

## 2019-10-10 DIAGNOSIS — Z950 Presence of cardiac pacemaker: Secondary | ICD-10-CM

## 2019-10-12 NOTE — Progress Notes (Signed)
EPIC Encounter for ICM Monitoring  Patient Name: [name] is a 74 y.o. female Date: 10/12/2019 Primary Care Physican: Carol Ada, MD Primary [place] [facility]: Dr Johnney Ou Bi-V Pacing:>99% 10/12/2019 Weight: 181lbs   Heart failure questions reviewed.She is asymptomatic for fluid accumulation.    CorvueThoracic impedance suggesting possible fluid accumulation since 10/07/2019.  Prescribed:Furosemide40 mg take 1 tablet daily.  Labs: 07/06/2019 Creatinine 2.64, BUN 34, Potassium 4.8, Sodium 143, GFR 18-20 02/01/2019 Creatinine 2.77, BUN 39, Potassium 4.5, Sodium 144, GFR 17-19 10/15/2018 Creatinine 2.26, BUN 36, Potassium 4.6, Sodium 143, GFR 21-25  Recommendations: Reinforced limiting salt intake to < 2000 mg daily.  Encouraged to call if experiencing fluid symptoms.  Follow-up plan: ICM clinic phone appointment on 11/14/2019 (will have fluid rechecked at 1/11 OV).   91 day device clinic remote transmission 11/24/2019.  Office appt 10/17/2019 with Dr. [name].    Copy of ICM check sent to Dr. [name].   3 month ICM trend: 10/10/2019    1 Year ICM trend:       [name], RN 10/12/2019 12:41 PM

## 2019-10-17 ENCOUNTER — Encounter: Payer: Self-pay | Admitting: Internal Medicine

## 2019-10-17 ENCOUNTER — Ambulatory Visit (INDEPENDENT_AMBULATORY_CARE_PROVIDER_SITE_OTHER): Payer: Medicare Other | Admitting: Internal Medicine

## 2019-10-17 ENCOUNTER — Other Ambulatory Visit: Payer: Self-pay

## 2019-10-17 VITALS — BP 106/80 | HR 75 | Ht 63.0 in | Wt 176.4 lb

## 2019-10-17 DIAGNOSIS — E032 Hypothyroidism due to medicaments and other exogenous substances: Secondary | ICD-10-CM

## 2019-10-17 DIAGNOSIS — I471 Supraventricular tachycardia: Secondary | ICD-10-CM

## 2019-10-17 DIAGNOSIS — R05 Cough: Secondary | ICD-10-CM

## 2019-10-17 DIAGNOSIS — Z79899 Other long term (current) drug therapy: Secondary | ICD-10-CM

## 2019-10-17 DIAGNOSIS — Z950 Presence of cardiac pacemaker: Secondary | ICD-10-CM

## 2019-10-17 DIAGNOSIS — I428 Other cardiomyopathies: Secondary | ICD-10-CM | POA: Diagnosis not present

## 2019-10-17 DIAGNOSIS — I5022 Chronic systolic (congestive) heart failure: Secondary | ICD-10-CM

## 2019-10-17 DIAGNOSIS — R059 Cough, unspecified: Secondary | ICD-10-CM

## 2019-10-17 DIAGNOSIS — I495 Sick sinus syndrome: Secondary | ICD-10-CM

## 2019-10-17 NOTE — Progress Notes (Signed)
Patient Care Team: Carol Ada, MD as PCP - General (Family Medicine) Deboraha Sprang, MD as PCP - Cardiology (Cardiology)   HPI  [name] is a 74 y.o. 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  She also has a history of atrial arrhythmias and a TIA for which she takes apixoban,  SCAF was noted 5/17. Episode duration was 12 hours or so.  Because of ongoing problems with congestive heart failure she underwent CRT upgrade 7/18. 9/20 reprogrammed offsets for ECG optimization  Shortness of breath much improved.  A little bit of abdominal swelling over the last couple of weeks.  Was eating a lot out from her home with more salt.  Has increased her furosemide 40--80 x 3 days with some improvement but still some distention  Spent Christmas with her son in [place].  May be relocating.  Dry cough.  No nausea.   DATE TEST EF   4/13 Echo  35-40 %   3/15 Echo 40-45 %   5/18 Echo 25-30%   1/19 Echo 40-45%   9/20 Echo  25-30% BAE    Date Cr K TSH LFTs Mg Hgb  6/15  2.3 4.5      6/17  1.82* 5.2      4/18 1.91 5.1   2.3 10.8  7/18 2.17 4.8      11/19 2.32 4.5 3.02 15  11.1  1/20 2.26 4.6      4/20 2.77       9/20 2.64 4.8 2.15 27                 Past Medical History:  Diagnosis Date  . Anemia   . Arthritis    "all over" (04/20/2017)  . Atrial tachycardia ([place])   . Back pain   . Cardiomyopathy ([place])    due to sarcoid  . Cataract   . CHF (congestive heart failure) ([place])   . CKD (chronic kidney disease), stage III   . Complete heart block (HCC)    a. s/p STJ dual chamber pacemaker 1999; upgrade to CRTP 2018  . Constipation   . COPD (chronic obstructive pulmonary disease) ([place])   . Diverticulosis   . Dyspnea   . Glaucoma   . HLD (hyperlipidemia)   . HTN (hypertension)   . Iritis   . Joint pain   . Leg edema   . NICM  (nonischemic cardiomyopathy) ([place])    echo 1/11: EF 35-40%, mod LVH, Grade 1 diast dysfxn, mild BAE;    cath 2/06: luminal irregs;  Myoview 10/2:  no scar or ischemia, EF 37%   . OSA (obstructive sleep apnea) 08/21/2016   "never RX'd mask" (04/20/2017)  . Pacemaker    SA node dysfunction; Dr. [name]  . Paroxysmal atrial fibrillation (HCC)   . Rheumatic fever   . Rheumatoid arthritis ([place])    Dr. Trudie Reed  . 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 Trudie Reed  . Stomach ulcer   . Stroke [facility]) ~ 2016   "mini-stroke"; denies residual on 04/20/2017)  . Syncope 05/2011   PRESYNCOPE  . Tuberculosis 1950s/1960s?    Past Surgical History:  Procedure Laterality Date  . BIV UPGRADE N/A 04/20/2017   Procedure: BiV Upgrade;  Surgeon: Deboraha Sprang, MD;  Location: [facility];  Service: Cardiovascular;  Laterality: N/A;  .  CARDIAC CATHETERIZATION    . CARDIOVERSION N/A 10/18/2018   Procedure: CARDIOVERSION;  Surgeon: Deboraha Sprang, MD;  Location: [facility];  Service: Cardiovascular;  Laterality: N/A;  . CESAREAN SECTION    . colonoscopy  11/17/2017   polyps, repeat 5 years Dr. Therisa Doyne  . EYE SURGERY    . GLAUCOMA SURGERY Right   . INSERT / REPLACE / [place]; 12/26/13   STJ dual chamber pacemaker implanted 1999; gen change 12/2013 by Dr [name] - STJ   . PERMANENT PACEMAKER GENERATOR CHANGE N/A 01/02/2014   Procedure: PERMANENT PACEMAKER GENERATOR CHANGE;  Surgeon: Deboraha Sprang, MD;  Location: [facility] CATH LAB;  Service: Cardiovascular;  Laterality: N/A;  . TUBAL LIGATION    . VAGINAL HYSTERECTOMY      Current Outpatient Medications  Medication Sig Dispense Refill  . amiodarone (PACERONE) 200 MG tablet Take 1/2 tablet (100 mg) by mouth once daily    . apixaban (ELIQUIS) 5 MG TABS tablet Take 1 tablet (5 mg total) by mouth 2 (two) times daily. 60 tablet 3  . bimatoprost (LUMIGAN) 0.01 % SOLN Place 1 drop into the right eye at bedtime.       Marland Kitchen BIOTIN PO Take 2,000 mg by mouth daily.    . carvedilol (COREG) 12.5 MG tablet TAKE 1 TABLET BY MOUTH TWICE DAILY WITH A MEAL 180 tablet 3  . Cholecalciferol (VITAMIN D) 50 MCG (2000 UT) tablet Take 2,000 Units by mouth daily.    . cycloSPORINE (RESTASIS) 0.05 % ophthalmic emulsion Place 1 drop into the right eye 2 (two) times daily.     Marland Kitchen ezetimibe (ZETIA) 10 MG tablet Take 10 mg by mouth daily at 2 PM.     . Ferrous Gluconate (IRON 27 PO) Take 1 tablet by mouth daily.    . furosemide (LASIX) 40 MG tablet Take 1 tablet (40 mg total) by mouth daily. 90 tablet 3  . hydroxychloroquine (PLAQUENIL) 200 MG tablet Take 200 mg by mouth 2 (two) times daily.     Marland Kitchen loteprednol (LOTEMAX) 0.5 % ophthalmic suspension Place 1 drop into both eyes 4 (four) times daily.     . Multiple Vitamins-Minerals (PRESERVISION/LUTEIN) CAPS Take 1 capsule by mouth 2 (two) times daily. In the morning & afternoon    . nitroGLYCERIN (NITROSTAT) 0.4 MG SL tablet Place 0.4 mg under the tongue every 5 (five) minutes as needed for chest pain. For chest pain     No current facility-administered medications for this visit.    Allergies  Allergen Reactions  . Morphine And Related Anaphylaxis    Throat closing  . Codeine Itching  . Methotrexate Derivatives     Itchy rash   . Penicillins Itching    Has patient had a PCN reaction causing immediate rash, facial/tongue/throat swelling, SOB or lightheadedness with hypotension: No Has patient had a PCN reaction causing severe rash involving mucus membranes or skin necrosis: No Has patient had a PCN reaction that required hospitalization: No Has patient had a PCN reaction occurring within the last 10 years: No If all of the above answers are "NO", then may proceed with Cephalosporin use.     Review of Systems negative except from HPI and PMH  Physical Exam BP 106/80   Pulse 75   Ht 5\' 3"  (1.6 m)   Wt 176 lb 6.4 oz (80 kg)   SpO2 98%   BMI 31.25 kg/m  Well developed  and well nourished in no acute distress HENT  normal Neck supple with JVP-flat Clear Device pocket well healed; without hematoma or erythema.  There is no tethering  Regular rate and rhythm, no  murmur Abd-soft with active BS No Clubbing cyanosis   edema Skin-warm and dry A & Oriented  Grossly normal sensory and motor function  ECG AV pacing QRS upright lead 1 and neg lead 1    Assessment and  Plan  Nonischemic cardiomyopathy   Sarcoid heart disease   Atrial fibrillation //SCAF   Atrial tachycardia-cycle length 360 ms  Hypertension  Complete heart block   Cough  Renal Insufficiency  Grade 4   Sinus node dysfunction  Congestive Heart failure  chronic 2-3   Pacemaker  CRT St Jude    Without interval atrial arrhythmia.  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  We will get a high-resolution CT scan.  Volume overloaded.  After Christmas.  Her Lasix has been increased from 40-80 daily x3 days.  We will continue with 80 mg x 3 days and then return to 40 mg.  Overall however heart failure is much improved following electrical optimization 9/20 She is relocating to be with her son in [place].  We may or may not see her again.  Blood pressure well controlled.  Will check amio surveillance labs

## 2019-10-17 NOTE — Patient Instructions (Addendum)
Medication Instructions:  Your physician has recommended you make the following change in your medication:   Increase you Lasix from 40mg  to 80mg  by mouth for 3 days then go back to your regular dose of Lasix 40mg .  Labwork: TSH and Liver Function today   Testing/Procedures: High resolution CT of chest  Follow-Up: Your physician wants you to follow-up in: 6 months with Dr [name]. You will receive a reminder letter in the mail two months in advance. If you don't receive a letter, please call our office to schedule the follow-up appointment.  Remote monitoring is used to monitor your Pacemaker of ICD from home. This monitoring reduces the number of office visits required to check your device to one time per year. It allows [place] to keep an eye on the functioning of your device to ensure it is working properly. Any Other Special Instructions Will Be Listed Below (If Applicable).  If you need a refill on your cardiac medications before your next appointment, please call your pharmacy.

## 2019-10-18 LAB — CUP PACEART INCLINIC DEVICE CHECK
Date Time Interrogation Session: 20210111102707
Implantable Lead Implant Date: 19990414
Implantable Lead Implant Date: 19990414
Implantable Lead Implant Date: 20180716
Implantable Lead Location: 753858
Implantable Lead Location: 753859
Implantable Lead Location: 753860
Implantable Pulse Generator Implant Date: 20150330
Pulse Gen Model: 3242
Pulse Gen Serial Number: 7610861

## 2019-10-18 LAB — HEPATIC FUNCTION PANEL
ALT: 16 IU/L (ref 0–32)
AST: 25 IU/L (ref 0–40)
Albumin: 4 g/dL (ref 3.7–4.7)
Alkaline Phosphatase: 103 IU/L (ref 39–117)
Bilirubin Total: 0.5 mg/dL (ref 0.0–1.2)
Bilirubin, Direct: 0.18 mg/dL (ref 0.00–0.40)
Total Protein: 6.6 g/dL (ref 6.0–8.5)

## 2019-10-18 LAB — TSH: TSH: 3.16 u[IU]/mL (ref 0.450–4.500)

## 2019-10-27 ENCOUNTER — Ambulatory Visit
Admission: RE | Admit: 2019-10-27 | Discharge: 2019-10-27 | Disposition: A | Discharge status: Home / Self Care | Payer: Medicare Other | Source: Ambulatory Visit | Attending: Internal Medicine | Admitting: Internal Medicine

## 2019-10-27 ENCOUNTER — Other Ambulatory Visit: Payer: Medicare Other

## 2019-10-27 DIAGNOSIS — R059 Cough, unspecified: Secondary | ICD-10-CM

## 2019-10-27 DIAGNOSIS — R05 Cough: Secondary | ICD-10-CM

## 2019-10-31 ENCOUNTER — Other Ambulatory Visit: Payer: Self-pay | Admitting: Internal Medicine

## 2019-11-14 ENCOUNTER — Ambulatory Visit (INDEPENDENT_AMBULATORY_CARE_PROVIDER_SITE_OTHER): Payer: Medicare Other

## 2019-11-14 DIAGNOSIS — I5022 Chronic systolic (congestive) heart failure: Secondary | ICD-10-CM

## 2019-11-14 DIAGNOSIS — Z950 Presence of cardiac pacemaker: Secondary | ICD-10-CM | POA: Diagnosis not present

## 2019-11-18 NOTE — Progress Notes (Signed)
EPIC Encounter for ICM Monitoring  Patient Name: [name] is a 74 y.o. female Date: 11/18/2019 Primary Care Physican: Carol Ada, MD Primary [facility]: Dr Johnney Ou Bi-V Pacing:>99% 11/18/2019 Weight: 181lbs   Spoke with patient and she reported she could tell she has fluid accumulation during decreased impedance.   CorvueThoracic impedance normal but was suggesting possible fluid accumulation since 10/21/2019 - 11/04/2019.  Prescribed:Furosemide40 mg take 1 tablet daily.  Labs: 07/06/2019 Creatinine 2.64, BUN 34, Potassium 4.8, Sodium 143, GFR 18-20 02/01/2019 Creatinine 2.77, BUN 39, Potassium 4.5, Sodium 144, GFR 17-19 10/15/2018 Creatinine 2.26, BUN 36, Potassium 4.6, Sodium 143, GFR 21-25  Recommendations: No changes and encouraged to call if experiencing any fluid symptoms.  Follow-up plan: ICM clinic phone appointment on 12/19/2019.   91 day device clinic remote transmission 11/24/2019.    Copy of ICM check sent to Dr. [name].   3 month ICM trend: 11/14/2019    1 Year ICM trend:       [name], RN 11/18/2019 10:04 AM

## 2019-11-23 LAB — CUP PACEART REMOTE DEVICE CHECK
Battery Remaining Longevity: 25 mo
Battery Remaining Percentage: 50 %
Battery Voltage: 2.87 V
Brady Statistic AP VP Percent: 99 %
Brady Statistic AP VS Percent: 1 %
Brady Statistic AS VP Percent: 1 %
Brady Statistic AS VS Percent: 0 %
Brady Statistic RA Percent Paced: 99 %
Date Time Interrogation Session: 20210217092226
Implantable Lead Implant Date: 19990414
Implantable Lead Implant Date: 19990414
Implantable Lead Implant Date: 20180716
Implantable Lead Location: 753858
Implantable Lead Location: 753859
Implantable Lead Location: 753860
Implantable Pulse Generator Implant Date: 20150330
Lead Channel Impedance Value: 1100 Ohm
Lead Channel Impedance Value: 310 Ohm
Lead Channel Impedance Value: 350 Ohm
Lead Channel Pacing Threshold Amplitude: 0.75 V
Lead Channel Pacing Threshold Amplitude: 1 V
Lead Channel Pacing Threshold Amplitude: 2 V
Lead Channel Pacing Threshold Pulse Width: 0.4 ms
Lead Channel Pacing Threshold Pulse Width: 0.5 ms
Lead Channel Pacing Threshold Pulse Width: 0.6 ms
Lead Channel Sensing Intrinsic Amplitude: 12 mV
Lead Channel Sensing Intrinsic Amplitude: 5 mV
Lead Channel Setting Pacing Amplitude: 2 V
Lead Channel Setting Pacing Amplitude: 2.5 V
Lead Channel Setting Pacing Amplitude: 3 V
Lead Channel Setting Pacing Pulse Width: 0.5 ms
Lead Channel Setting Pacing Pulse Width: 0.6 ms
Lead Channel Setting Sensing Sensitivity: 4 mV
Pulse Gen Model: 3242
Pulse Gen Serial Number: 7610861

## 2019-11-24 ENCOUNTER — Ambulatory Visit (INDEPENDENT_AMBULATORY_CARE_PROVIDER_SITE_OTHER): Payer: Medicare Other | Admitting: *Deleted

## 2019-11-24 DIAGNOSIS — I509 Heart failure, unspecified: Secondary | ICD-10-CM | POA: Diagnosis not present

## 2019-11-24 NOTE — Progress Notes (Signed)
ICD Remote  

## 2019-12-13 ENCOUNTER — Other Ambulatory Visit (HOSPITAL_COMMUNITY): Payer: Self-pay

## 2019-12-14 ENCOUNTER — Encounter (HOSPITAL_COMMUNITY)
Admission: RE | Admit: 2019-12-14 | Discharge: 2019-12-14 | Disposition: A | Discharge status: Home / Self Care | Payer: Medicare Other | Source: Ambulatory Visit | Attending: Internal Medicine | Admitting: Internal Medicine

## 2019-12-14 ENCOUNTER — Other Ambulatory Visit: Payer: Self-pay

## 2019-12-14 DIAGNOSIS — D631 Anemia in chronic kidney disease: Secondary | ICD-10-CM | POA: Insufficient documentation

## 2019-12-14 DIAGNOSIS — N189 Chronic kidney disease, unspecified: Secondary | ICD-10-CM | POA: Diagnosis present

## 2019-12-14 MED ORDER — SODIUM CHLORIDE 0.9 % IV SOLN
510.0000 mg | INTRAVENOUS | Status: DC
  Administered 2019-12-14: 510 mg via INTRAVENOUS
  Filled 2019-12-14: qty 17

## 2019-12-14 NOTE — Discharge Instructions (Signed)

## 2019-12-19 ENCOUNTER — Ambulatory Visit (INDEPENDENT_AMBULATORY_CARE_PROVIDER_SITE_OTHER): Payer: Medicare Other

## 2019-12-19 DIAGNOSIS — Z950 Presence of cardiac pacemaker: Secondary | ICD-10-CM | POA: Diagnosis not present

## 2019-12-19 DIAGNOSIS — I509 Heart failure, unspecified: Secondary | ICD-10-CM | POA: Diagnosis not present

## 2019-12-21 ENCOUNTER — Other Ambulatory Visit: Payer: Self-pay

## 2019-12-21 ENCOUNTER — Encounter (HOSPITAL_COMMUNITY)
Admission: RE | Admit: 2019-12-21 | Discharge: 2019-12-21 | Disposition: A | Discharge status: Home / Self Care | Payer: Medicare Other | Source: Ambulatory Visit | Attending: Internal Medicine | Admitting: Internal Medicine

## 2019-12-21 DIAGNOSIS — N189 Chronic kidney disease, unspecified: Secondary | ICD-10-CM | POA: Diagnosis not present

## 2019-12-21 MED ORDER — SODIUM CHLORIDE 0.9 % IV SOLN
510.0000 mg | INTRAVENOUS | Status: DC
  Administered 2019-12-21: 510 mg via INTRAVENOUS
  Filled 2019-12-21: qty 510

## 2019-12-21 NOTE — Progress Notes (Signed)
EPIC Encounter for ICM Monitoring  Patient Name: [name] is a 74 y.o. female Date: 12/21/2019 Primary Care Physican: Carol Ada, MD Primary [facility]: Dr Johnney Ou Bi-V Pacing:>99% 2/12/2021Weight: 181lbs   Attempted call to patient and unable to reach.  Left detailed message per DPR regarding transmission. Transmission reviewed.   CorvueThoracic impedance normal.  Prescribed:Furosemide40 mg take 1 tablet daily.  Labs: 07/06/2019 Creatinine 2.64, BUN 34, Potassium 4.8, Sodium 143, GFR 18-20 02/01/2019 Creatinine 2.77, BUN 39, Potassium 4.5, Sodium 144, GFR 17-19 10/15/2018 Creatinine 2.26, BUN 36, Potassium 4.6, Sodium 143, GFR 21-25  Recommendations:Left voice mail with ICM number and encouraged to call if experiencing any fluid symptoms.  Follow-up plan: ICM clinic phone appointment on4/19/2021. 91 day device clinic remote transmission 02/23/2020.    Copy of ICM check sent to Dr. [name].   3 month ICM trend: 12/19/2019    1 Year ICM trend:       [name], RN 12/21/2019 4:32 PM

## 2019-12-30 ENCOUNTER — Other Ambulatory Visit: Payer: Self-pay | Admitting: Internal Medicine

## 2019-12-30 ENCOUNTER — Other Ambulatory Visit: Payer: Self-pay | Admitting: Nurse Practitioner

## 2019-12-30 NOTE — Telephone Encounter (Signed)
Prescription refill request for Eliquis received.  Last office visit: [name] 10/17/2019 Scr:  2.64 07/06/2019 Age: 74 y.o. Weight: 75.8 kg   Prescription refill sent.

## 2020-01-13 ENCOUNTER — Ambulatory Visit (INDEPENDENT_AMBULATORY_CARE_PROVIDER_SITE_OTHER): Payer: Medicare Other | Admitting: Physician Assistant

## 2020-01-13 ENCOUNTER — Other Ambulatory Visit: Payer: Self-pay

## 2020-01-13 VITALS — BP 110/74 | HR 74 | Ht 63.0 in | Wt 170.0 lb

## 2020-01-13 DIAGNOSIS — D8685 Sarcoid myocarditis: Secondary | ICD-10-CM

## 2020-01-13 DIAGNOSIS — I471 Supraventricular tachycardia: Secondary | ICD-10-CM

## 2020-01-13 DIAGNOSIS — I428 Other cardiomyopathies: Secondary | ICD-10-CM | POA: Diagnosis not present

## 2020-01-13 DIAGNOSIS — I442 Atrioventricular block, complete: Secondary | ICD-10-CM

## 2020-01-13 DIAGNOSIS — Z950 Presence of cardiac pacemaker: Secondary | ICD-10-CM

## 2020-01-13 DIAGNOSIS — I480 Paroxysmal atrial fibrillation: Secondary | ICD-10-CM

## 2020-01-13 DIAGNOSIS — I5022 Chronic systolic (congestive) heart failure: Secondary | ICD-10-CM

## 2020-01-13 DIAGNOSIS — Z79899 Other long term (current) drug therapy: Secondary | ICD-10-CM

## 2020-01-13 NOTE — Progress Notes (Signed)
Cardiology Office Note Date:  01/13/2020  Patient ID:  [name], [name] 1946/06/08, MRN [id number] PCP:  Carol Ada, MD  Cardiologist:  Dr. [name] Nephrology: Dr. Leda Roys    Chief Complaint: recommended to check-in  History of Present Illness: [name] is a 74 y.o. female with history of CKD (IV), HTN, HLD, NICM, cardiac sarcoid with sinus arrest and AV node disease, > chronic CHF worsening EF (suspected 2/2 RV pacing) > PPM upgraded to CRT-P, RA, stroke/TIA, Atach, AFib.  She was last seen by Dr. [name] Jan 2021, she was volume OL after the holidays, her diuretic further adjusted.  Planned for high resolution CT given she was on amiodarone to eval for pulm issues with her c/o a dry cough. Mentioned she may be relocating to [facility] to be near her son. CT negative for any pulm fibrosis (noting CHF, underlying sarcoid (findings similar to prior)  She had recently seen her nephrologist who has given her some guidelines on how/when tyo use an additional 1/2 tab of her lasix.  She does this a couple days usuall a week, maybe not that often. She is planned to move to [facility] at the end of the month and wanted to check in with [place] before leaving.  She feels like she is at about her baseline.  No CP or palpitations.  No near syncope or syncope, no shocks She ha baseline DOE, can comfortable walk on flat ground, does groceries ok,will get winded with a flight of stairs.  She has chronic 2 pillow orthopnea, no PND of late at least  She mentions intermittently some noted bleeding with BM only, states he PMD told her was likely hemorrhoid.  Device information STJ PPM implanted 1999 >> gen change 2006, 2015, upgrade to CRT-P 04/30/2017 RA/RV leads from [place]  ECG optimization 07/06/2019 done 2/2 marked reduction in her LVEF increased the LV RV offset from 25--80 which resulted in an upright QRS in lead V1 and negative QRS in lead I.  Her QRS duration however remains longer about 180  ms  AFib hx 2015 SCAF  2018 noted increased and symptomatic AFib burden Atach, 10/15/2018, attempts to pace terminate in office failed, >> DCCV  AAD Hx Amiodarone looks like started about jan 2020   Past Medical History:  Diagnosis Date  . Anemia   . Arthritis    "all over" (04/20/2017)  . Atrial tachycardia ([place])   . Back pain   . Cardiomyopathy ([place])    due to sarcoid  . Cataract   . CHF (congestive heart failure) ([place])   . CKD (chronic kidney disease), stage III   . Complete heart block (HCC)    a. s/p STJ dual chamber pacemaker 1999; upgrade to CRTP 2018  . Constipation   . COPD (chronic obstructive pulmonary disease) ([place])   . Diverticulosis   . Dyspnea   . Glaucoma   . HLD (hyperlipidemia)   . HTN (hypertension)   . Iritis   . Joint pain   . Leg edema   . NICM (nonischemic cardiomyopathy) ([place])    echo 1/11: EF 35-40%, mod LVH, Grade 1 diast dysfxn, mild BAE;    cath 2/06: luminal irregs;  Myoview 10/2:  no scar or ischemia, EF 37%   . OSA (obstructive sleep apnea) 08/21/2016   "never RX'd mask" (04/20/2017)  . Pacemaker    SA node dysfunction; Dr. [name]  . Paroxysmal atrial fibrillation (HCC)   . Rheumatic fever   . Rheumatoid  arthritis ([place])    Dr. Trudie Reed  . 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 Trudie Reed  . Stomach ulcer   . Stroke [facility]) ~ 2016   "mini-stroke"; denies residual on 04/20/2017)  . Syncope 05/2011   PRESYNCOPE  . Tuberculosis 1950s/1960s?    Past Surgical History:  Procedure Laterality Date  . BIV UPGRADE N/A 04/20/2017   Procedure: BiV Upgrade;  Surgeon: Deboraha Sprang, MD;  Location: [facility] LAB;  Service: Cardiovascular;  Laterality: N/A;  . CARDIAC CATHETERIZATION    . CARDIOVERSION N/A 10/18/2018   Procedure: CARDIOVERSION;  Surgeon: Deboraha Sprang, MD;  Location: [facility];  Service: Cardiovascular;  Laterality: N/A;  . CESAREAN SECTION    . colonoscopy  11/17/2017   polyps,  repeat 5 years Dr. Therisa Doyne  . EYE SURGERY    . GLAUCOMA SURGERY Right   . INSERT / REPLACE / [place]; 12/26/13   STJ dual chamber pacemaker implanted 1999; gen change 12/2013 by Dr [name] - STJ   . PERMANENT PACEMAKER GENERATOR CHANGE N/A 01/02/2014   Procedure: PERMANENT PACEMAKER GENERATOR CHANGE;  Surgeon: Deboraha Sprang, MD;  Location: [facility] CATH LAB;  Service: Cardiovascular;  Laterality: N/A;  . TUBAL LIGATION    . VAGINAL HYSTERECTOMY      Current Outpatient Medications  Medication Sig Dispense Refill  . amiodarone (PACERONE) 200 MG tablet TAKE 1 TABLET BY MOUTH DAILY 90 tablet 3  . bimatoprost (LUMIGAN) 0.01 % SOLN Place 1 drop into the right eye at bedtime.     Marland Kitchen BIOTIN PO Take 2,000 mg by mouth daily.    . carvedilol (COREG) 12.5 MG tablet TAKE 1 TABLET BY MOUTH TWICE DAILY WITH A MEAL 180 tablet 3  . Cholecalciferol (VITAMIN D) 50 MCG (2000 UT) tablet Take 2,000 Units by mouth daily.    . cycloSPORINE (RESTASIS) 0.05 % ophthalmic emulsion Place 1 drop into the right eye 2 (two) times daily.     Marland Kitchen ELIQUIS 5 MG TABS tablet TAKE 1 TABLET(5 MG) BY MOUTH TWICE DAILY 60 tablet 5  . ezetimibe (ZETIA) 10 MG tablet Take 10 mg by mouth daily at 2 PM.     . Ferrous Gluconate (IRON 27 PO) Take 1 tablet by mouth daily.    . furosemide (LASIX) 40 MG tablet Take 1 tablet (40 mg total) by mouth daily. 90 tablet 3  . hydroxychloroquine (PLAQUENIL) 200 MG tablet Take 200 mg by mouth 2 (two) times daily.     Marland Kitchen loteprednol (LOTEMAX) 0.5 % ophthalmic suspension Place 1 drop into both eyes 4 (four) times daily.     . Multiple Vitamins-Minerals (PRESERVISION/LUTEIN) CAPS Take 1 capsule by mouth 2 (two) times daily. In the morning & afternoon    . nitroGLYCERIN (NITROSTAT) 0.4 MG SL tablet Place 0.4 mg under the tongue every 5 (five) minutes as needed for chest pain. For chest pain     No current facility-administered medications for this visit.    Allergies:   Morphine and related, Codeine,  Methotrexate derivatives, and Penicillins   Social History:  The patient  reports that she quit smoking about 31 years ago. Her smoking use included cigarettes. She has a 4.00 pack-year smoking history. She has never used smokeless tobacco. She reports that she does not drink alcohol or use drugs.   Family History:  The patient's family history includes Alcoholism in her father; Asthma in her father; Colon cancer in her  brother and brother; Coronary artery disease in her sister and sister; Diabetes in her sister; Heart disease in her father; Rheum arthritis in her mother; Stroke in her father and mother.  ROS:  Please see the history of present illness.  All other systems are reviewed and otherwise negative.   PHYSICAL EXAM:  VS:  BP 110/74   Pulse 74   Ht 5\' 3"  (1.6 m)   Wt 170 lb (77.1 kg)   SpO2 98%   BMI 30.11 kg/m  BMI: Body mass index is 30.11 kg/m. Well nourished, well developed, in no acute distress  HEENT: normocephalic, atraumatic  Neck: no JVD, carotid bruits or masses Cardiac:  RRR; no significant murmurs, no rubs, or gallops Lungs:  CTA b/l, no wheezing, rhonchi or rales  Abd: soft, nontender MS: no deformity or atrophy Ext: trace edema  Skin: warm and dry, no rash Neuro:  No gross deficits appreciated Psych: euthymic mood, full affect  PPM site is stable, no tethering or discomfort   EKG:  Not done today  PPM interrogation done today and reviewed by myself: battery and lead measurements are good One AMS no AGM available (10 seconds)\no HVR AV pacing at 40 today (she was mildly symptomatic at this rate >99% A and V pacing   07/04/2019: TTE IMPRESSIONS  1. Left ventricular ejection fraction, by visual estimation, is 25 to  30%. The left ventricle has severely decreased function. Severely  increased left ventricular size. There is mildly increased left  ventricular hypertrophy.  2. Left ventricular diastolic Doppler parameters are indeterminate  pattern of  LV diastolic filling.  3. Apical windows are foreshortened making accurate assessment of LVEF  difficult.  4. Global right ventricle has severely reduced systolic function.The  right ventricular size is moderately enlarged. No increase in right  ventricular wall thickness.  5. Left atrial size was severely dilated.  6. Right atrial size was severely dilated.  7. The mitral valve is normal in structure. Mild mitral valve  regurgitation.  8. The tricuspid valve is grossly normal. Tricuspid valve regurgitation  was not visualized by color flow Doppler.  9. The aortic valve is normal in structure. Aortic valve regurgitation  was not visualized by color flow Doppler.  10. .  11. The left ventricular function has worsened.  12. The right ventricular systolic function has worse.   In comparison to the previous echocardiogram(s): The left ventricular  function is worsened. The right ventricular systolic function is worse.    10/27/2019: High Res CT chest IMPRESSION: 1. Cardiomegaly with left ventricular dilatation, trace bilateral pleural effusions and evidence of interstitial pulmonary edema concerning for congestive heart failure. 2. Similar multifocal nodularity throughout the mid to upper lungs bilaterally compared to prior study from 2014, likely reflective of underlying sarcoidosis. 3. Aortic atherosclerosis. 4. Additional incidental findings, as above.  Aortic Atherosclerosis (ICD10-I70.0).  11/04/2017: TTE Study Conclusions  - Left ventricle: The cavity size was normal. Wall thickness was  increased in a pattern of severe LVH. Systolic function was  mildly to moderately reduced. The estimated ejection fraction was  in the range of 40% to 45%. Diffuse hypokinesis. The study is not  technically sufficient to allow evaluation of LV diastolic  function.  - Mitral valve: Mildly thickened leaflets . There was trivial  regurgitation.  - Left atrium: Moderately  dilated. The atrium was mildly dilated.  - Right ventricle: The cavity size was mildly dilated. Pacer wire  or catheter noted in right ventricle.  - Right atrium:  The atrium was normal in size. Pacer wire or  catheter noted in right atrium.  - Tricuspid valve: There was mild regurgitation.  - Pulmonary arteries: PA peak pressure: 26 mm Hg (S).  - Inferior vena cava: The vessel was normal in size. The  respirophasic diameter changes were in the normal range (= 50%),  consistent with normal central venous pressure.  - Pericardium, extracardiac: A trivial pericardial effusion was  identified posterior to the heart. Features were not consistent  with tamponade physiology.   Recent Labs: 07/06/2019: BUN 34; Creatinine, Ser 2.64; Potassium 4.8; Sodium 143 10/17/2019: ALT 16; TSH 3.160  No results found for requested labs within last 8760 hours.   CrCl cannot be calculated (Patient's most recent lab result is older than the maximum 21 days allowed.).   Wt Readings from Last 3 Encounters:  01/13/20 170 lb (77.1 kg)  12/14/19 167 lb (75.8 kg)  10/17/19 176 lb 6.4 oz (80 kg)     Other studies reviewed: Additional studies/records reviewed today include: summarized above  ASSESSMENT AND PLAN:  1. CRT-P     Device dependent   2. NICM 3. Chronic CHF biventricular (systolic) 4. Cardiac sarcoid     Last echo with worse EF > device optimization     Symptoms at her baseline, 1-2/week needs an extra 1/2 tab of lasix (dsed via her nephrologist     She has trace edema, weight is stable (down a few pounds), lungs clear     CorVue looks OK     She is BB, diuretic, no ACE/ARB given CKD (IV)  5. Paroxysmal Afib     A Tach     CHA2DS2Vasc is 7, on Eliquis, appropriately diagnosed for age/weight     <1% AF burden         6. HTN     Looks good    She is moving to [facility], ideally would like to update her echo post CRT pacing reprogramming and get her to the AHF clinic if not  improved She asks for [place] to do the echo prior to her leaving, and for now, would like to keep f/u appt here incase she needs to travel back.   She will via her son's MD get established there and referred to her specialist.  Advised not to delay that process. Discussed once she had a EP there they/she need to request transfer of her device monitoring.  Will go ahead and update her echo and get labs today for her eliquis, she is appreciative.    Disposition: F/u with remotes Q 3 mo, and will have her back in 3 mo, sooner if needed and if still local.  Current medicines are reviewed at length with the patient today.  The patient did not have any concerns regarding medicines.  Venetia Night, PA-C 01/13/2020 1:40 PM     [address] [place] [id number] [phone] (office)  [phone] (fax)

## 2020-01-13 NOTE — Patient Instructions (Signed)
Medication Instructions:   Your physician recommends that you continue on your current medications as directed. Please refer to the Current Medication list given to you today.   *If you need a refill on your cardiac medications before your next appointment, please call your pharmacy*   Lab Work:   BMET AND CBC TODAY   If you have labs (blood work) drawn today and your tests are completely normal, you will receive your results only by: Marland Kitchen MyChart Message (if you have MyChart) OR . A paper copy in the mail If you have any lab test that is abnormal or we need to change your treatment, we will call you to review the results.   Testing/Procedures: Your physician has requested that you have an echocardiogram. Echocardiography is a painless test that uses sound waves to create images of your heart. It provides your doctor with information about the size and shape of your heart and how well your heart's chambers and valves are working. This procedure takes approximately one hour. There are no restrictions for this procedure.   Follow-Up: At [facility], you and your health needs are our priority.  As part of our continuing mission to provide you with exceptional heart care, we have created designated Provider Care Teams.  These Care Teams include your primary Cardiologist (physician) and Advanced Practice Providers (APPs -  Physician Assistants and Nurse Practitioners) who all work together to provide you with the care you need, when you need it.  We recommend signing up for the patient portal called "MyChart".  Sign up information is provided on this After Visit Summary.  MyChart is used to connect with patients for Virtual Visits (Telemedicine).  Patients are able to view lab/test results, encounter notes, upcoming appointments, etc.  Non-urgent messages can be sent to your provider as well.   To learn more about what you can do with MyChart, go to [email or web address].    Your next  appointment:    3 month(s)  The format for your next appointment:   In Person  Provider:   You may see Dr. Lequita Halt or one of the following Advanced Practice Providers on your designated Care Team:    Chanetta Marshall, NP  Tommye Standard, PA-C  Legrand Como "Oda [place]ts, [place]    Other Instructions

## 2020-01-14 LAB — BASIC METABOLIC PANEL
BUN/Creatinine Ratio: 13 (ref 12–28)
BUN: 39 mg/dL — ABNORMAL HIGH (ref 8–27)
CO2: 26 mmol/L (ref 20–29)
Calcium: 9.9 mg/dL (ref 8.7–10.3)
Chloride: 103 mmol/L (ref 96–106)
Creatinine, Ser: 3.06 mg/dL — ABNORMAL HIGH (ref 0.57–1.00)
GFR calc Af Amer: 17 mL/min/{1.73_m2} — ABNORMAL LOW (ref >59)
GFR calc non Af Amer: 15 mL/min/{1.73_m2} — ABNORMAL LOW (ref >59)
Glucose: 85 mg/dL (ref 65–99)
Potassium: 4.6 mmol/L (ref 3.5–5.2)
Sodium: 142 mmol/L (ref 134–144)

## 2020-01-14 LAB — CBC
Hematocrit: 32.6 % — ABNORMAL LOW (ref 34.0–46.6)
Hemoglobin: 10.3 g/dL — ABNORMAL LOW (ref 11.1–15.9)
MCH: 30.4 pg (ref 26.6–33.0)
MCHC: 31.6 g/dL (ref 31.5–35.7)
MCV: 96 fL (ref 79–97)
Platelets: 159 10*3/uL (ref 150–450)
RBC: 3.39 x10E6/uL — ABNORMAL LOW (ref 3.77–5.28)
RDW: 15.4 % (ref 11.7–15.4)
WBC: 4 10*3/uL (ref 3.4–10.8)

## 2020-01-23 ENCOUNTER — Ambulatory Visit (INDEPENDENT_AMBULATORY_CARE_PROVIDER_SITE_OTHER): Payer: Medicare Other

## 2020-01-23 DIAGNOSIS — Z950 Presence of cardiac pacemaker: Secondary | ICD-10-CM | POA: Diagnosis not present

## 2020-01-23 DIAGNOSIS — I5022 Chronic systolic (congestive) heart failure: Secondary | ICD-10-CM | POA: Diagnosis not present

## 2020-01-23 NOTE — Progress Notes (Signed)
EPIC Encounter for ICM Monitoring  Patient Name: [name] is a 74 y.o. female Date: 01/23/2020 Primary Care Physican: Carol Ada, MD Primary [facility]: Dr Johnney Ou Bi-V Pacing:>99% 4/19/2021Weight: 170lbs   Spoke with patient and she is symptomatic with increased SOB and ankle swelling.  She has been taking Furosemide 60 mg daily x 1 week as instructed by Tommye Standard, PA at 01/13/2020 OV.        The extra Furosemide has improved symptoms but not resolved.          She is moving to [facility] on May 13th to be closer to her son.  He will be helping with her care.  She has lived in this area for 40 years.         She was instructed by Renee at 01/13/20 OV to get established with cardiologist and physician that will monitor her device.         CorvueThoracic impedancesuggesting ongoing possible fluid accumulation since 01/19/20.  Prescribed:Furosemide40 mg take 1 tablet daily.  Labs: 01/13/2020 Creatinine 3.06, BUN 39, Potassium 4.6, Sodium 142, GFR 15-17 07/06/2019 Creatinine 2.64, BUN 34, Potassium 4.8, Sodium 143, GFR 18-20 02/01/2019 Creatinine 2.77, BUN 39, Potassium 4.5, Sodium 144, GFR 17-19 10/15/2018 Creatinine 2.26, BUN 36, Potassium 4.6, Sodium 143, GFR 21-25  Recommendations: Advised to call nephrologist for further Furosemide dosing.  She agree to call today to discuss symptoms.   Follow-up plan: ICM clinic phone appointment on4/27/2021 to recheck fluid levels. 91 day device clinic remote transmission 02/23/2020.  Copy of ICM check sent to [place].   3 month ICM trend: 01/23/2020    1 Year ICM trend:       [name], RN 01/23/2020 12:55 PM

## 2020-01-27 ENCOUNTER — Ambulatory Visit (HOSPITAL_COMMUNITY): Payer: Medicare Other | Attending: Cardiovascular Disease

## 2020-01-27 ENCOUNTER — Other Ambulatory Visit: Payer: Self-pay

## 2020-01-27 DIAGNOSIS — I5022 Chronic systolic (congestive) heart failure: Secondary | ICD-10-CM | POA: Insufficient documentation

## 2020-01-27 DIAGNOSIS — I428 Other cardiomyopathies: Secondary | ICD-10-CM | POA: Insufficient documentation

## 2020-01-27 DIAGNOSIS — Z79899 Other long term (current) drug therapy: Secondary | ICD-10-CM | POA: Insufficient documentation

## 2020-01-31 ENCOUNTER — Ambulatory Visit (INDEPENDENT_AMBULATORY_CARE_PROVIDER_SITE_OTHER): Payer: Medicare Other

## 2020-01-31 DIAGNOSIS — Z950 Presence of cardiac pacemaker: Secondary | ICD-10-CM

## 2020-01-31 DIAGNOSIS — I5022 Chronic systolic (congestive) heart failure: Secondary | ICD-10-CM

## 2020-02-01 ENCOUNTER — Telehealth: Payer: Self-pay

## 2020-02-01 NOTE — Progress Notes (Signed)
EPIC Encounter for ICM Monitoring  Patient Name: [name] is a 74 y.o. female Date: 02/01/2020 Primary Care Physican: Carol Ada, MD Primary [facility]: Dr Johnney Ou Bi-V Pacing:>99% 4/19/2021Weight: 170lbs   Attempted call to patient and unable to reach.  Left detailed message per DPR regarding transmission. Transmission reviewed.  Patient moving to [facility] on 02/16/2020        CorvueThoracic impedancereturned to normal since 01/23/2020 remote transmission.  Prescribed:Furosemide40 mg take 1 tablet daily.  Labs: 01/13/2020 Creatinine 3.06, BUN 39, Potassium 4.6, Sodium 142, GFR 15-17 07/06/2019 Creatinine 2.64, BUN 34, Potassium 4.8, Sodium 143, GFR 18-20 02/01/2019 Creatinine 2.77, BUN 39, Potassium 4.5, Sodium 144, GFR 17-19 10/15/2018 Creatinine 2.26, BUN 36, Potassium 4.6, Sodium 143, GFR 21-25  Recommendations: Left voice mail with ICM number and encouraged to call if experiencing any fluid symptoms.  Follow-up plan: ICM clinic phone appointment on5/24/2021.91 day device clinic remote transmission5/20/2021.  Copy of ICM check sent to [place].  3 month ICM trend: 01/31/2020    1 Year ICM trend:       [name], RN 02/01/2020 5:09 PM

## 2020-02-01 NOTE — Telephone Encounter (Signed)
Remote ICM transmission received.  Attempted call to patient regarding ICM remote transmission and left detailed message per DPR.  Advised to return call for any fluid symptoms or questions. Next ICM remote transmission scheduled 02/27/2020.

## 2020-02-23 ENCOUNTER — Ambulatory Visit (INDEPENDENT_AMBULATORY_CARE_PROVIDER_SITE_OTHER): Payer: Medicare Other | Admitting: *Deleted

## 2020-02-23 DIAGNOSIS — I442 Atrioventricular block, complete: Secondary | ICD-10-CM | POA: Diagnosis not present

## 2020-02-23 LAB — CUP PACEART REMOTE DEVICE CHECK
Battery Remaining Longevity: 20 mo
Battery Remaining Percentage: 38 %
Battery Voltage: 2.84 V
Brady Statistic AP VP Percent: 99 %
Brady Statistic AP VS Percent: 1 %
Brady Statistic AS VP Percent: 1 %
Brady Statistic AS VS Percent: 0 %
Brady Statistic RA Percent Paced: 99 %
Date Time Interrogation Session: 20210520035528
Implantable Lead Implant Date: 19990414
Implantable Lead Implant Date: 19990414
Implantable Lead Implant Date: 20180716
Implantable Lead Location: 753858
Implantable Lead Location: 753859
Implantable Lead Location: 753860
Implantable Pulse Generator Implant Date: 20150330
Lead Channel Impedance Value: 1000 Ohm
Lead Channel Impedance Value: 330 Ohm
Lead Channel Impedance Value: 360 Ohm
Lead Channel Pacing Threshold Amplitude: 0.75 V
Lead Channel Pacing Threshold Amplitude: 1 V
Lead Channel Pacing Threshold Amplitude: 2 V
Lead Channel Pacing Threshold Pulse Width: 0.4 ms
Lead Channel Pacing Threshold Pulse Width: 0.5 ms
Lead Channel Pacing Threshold Pulse Width: 0.6 ms
Lead Channel Sensing Intrinsic Amplitude: 12 mV
Lead Channel Sensing Intrinsic Amplitude: 5 mV
Lead Channel Setting Pacing Amplitude: 2 V
Lead Channel Setting Pacing Amplitude: 2.5 V
Lead Channel Setting Pacing Amplitude: 3 V
Lead Channel Setting Pacing Pulse Width: 0.5 ms
Lead Channel Setting Pacing Pulse Width: 0.6 ms
Lead Channel Setting Sensing Sensitivity: 4 mV
Pulse Gen Model: 3242
Pulse Gen Serial Number: 7610861

## 2020-02-27 ENCOUNTER — Ambulatory Visit (INDEPENDENT_AMBULATORY_CARE_PROVIDER_SITE_OTHER): Payer: Medicare Other

## 2020-02-27 DIAGNOSIS — Z950 Presence of cardiac pacemaker: Secondary | ICD-10-CM

## 2020-02-27 DIAGNOSIS — I5022 Chronic systolic (congestive) heart failure: Secondary | ICD-10-CM

## 2020-02-27 NOTE — Progress Notes (Signed)
Remote pacemaker transmission.   

## 2020-03-02 NOTE — Progress Notes (Signed)
EPIC Encounter for ICM Monitoring  Patient Name: [name] is a 74 y.o. female Date: 03/02/2020 Primary Care Physican: Carol Ada, MD Primary [facility]: Dr Johnney Ou Bi-V Pacing:>99% 4/19/2021Weight: 170lbs 03/02/2020 Weight: 175-176 lbs   Spoke with patient and feeling fine.   Patient reporting weight gain and slight lower leg swelling.  She has been taking 1 extra Fuorsemide to = 80 mg for a couple of days  Patient moved to [facility] on 02/16/2020.  CorvueThoracic impedancenormal.  Prescribed:Furosemide40 mg take 1 tablet daily.  Labs: 01/13/2020 Creatinine 3.06, BUN 39, Potassium 4.6, Sodium 142, GFR 15-17 07/06/2019 Creatinine 2.64, BUN 34, Potassium 4.8, Sodium 143, GFR 18-20 02/01/2019 Creatinine 2.77, BUN 39, Potassium 4.5, Sodium 144, GFR 17-19 10/15/2018 Creatinine 2.26, BUN 36, Potassium 4.6, Sodium 143, GFR 21-25  Recommendations: Advised to call kidney doctor today to discuss Furosemide dosage due to kidney status and symptoms and she agreed to do so.    Follow-up plan: ICM clinic phone appointment on6/28/2021.91 day device clinic remote transmission8/19/2021.  Copy of ICM check sent to [place].  3 month ICM trend: 02/27/2020    1 Year ICM trend:       [name], RN 03/02/2020 1:09 PM

## 2020-04-06 NOTE — Progress Notes (Signed)
No ICM remote transmission received for 04/02/2020 and next ICM transmission scheduled for 04/30/2020.   

## 2020-05-04 ENCOUNTER — Telehealth: Payer: Self-pay

## 2020-05-04 NOTE — Telephone Encounter (Signed)
The pt states she no longer lives in [place] and has an appointment with a new EP doctor. I told her to have him call so we can release her in [place].

## 2020-05-08 NOTE — Progress Notes (Signed)
Patient has moved out of the [place] area and acquiring a new EP physician.  Device clinic will continue to follow every 91 days until patient has transferred to new EP physician office. No further monthly ICM checks.

## 2020-05-24 ENCOUNTER — Ambulatory Visit (INDEPENDENT_AMBULATORY_CARE_PROVIDER_SITE_OTHER): Payer: Medicare Other | Admitting: *Deleted

## 2020-05-24 DIAGNOSIS — I442 Atrioventricular block, complete: Secondary | ICD-10-CM | POA: Diagnosis not present

## 2020-05-28 LAB — CUP PACEART REMOTE DEVICE CHECK
Battery Remaining Longevity: 14 mo
Battery Remaining Percentage: 28 %
Battery Voltage: 2.81 V
Brady Statistic AP VP Percent: 99 %
Brady Statistic AP VS Percent: 0 %
Brady Statistic AS VP Percent: 1 %
Brady Statistic AS VS Percent: 0 %
Brady Statistic RA Percent Paced: 100 %
Date Time Interrogation Session: 20210821090846
Implantable Lead Implant Date: 19990414
Implantable Lead Implant Date: 19990414
Implantable Lead Implant Date: 20180716
Implantable Lead Location: 753858
Implantable Lead Location: 753859
Implantable Lead Location: 753860
Implantable Pulse Generator Implant Date: 20150330
Lead Channel Impedance Value: 310 Ohm
Lead Channel Impedance Value: 350 Ohm
Lead Channel Impedance Value: 930 Ohm
Lead Channel Pacing Threshold Amplitude: 0.75 V
Lead Channel Pacing Threshold Amplitude: 2 V
Lead Channel Pacing Threshold Amplitude: 3.5 V
Lead Channel Pacing Threshold Pulse Width: 0.4 ms
Lead Channel Pacing Threshold Pulse Width: 0.5 ms
Lead Channel Pacing Threshold Pulse Width: 0.6 ms
Lead Channel Sensing Intrinsic Amplitude: 12 mV
Lead Channel Sensing Intrinsic Amplitude: 5 mV
Lead Channel Setting Pacing Amplitude: 2 V
Lead Channel Setting Pacing Amplitude: 2.5 V
Lead Channel Setting Pacing Amplitude: 3 V
Lead Channel Setting Pacing Pulse Width: 0.4 ms
Lead Channel Setting Pacing Pulse Width: 0.5 ms
Lead Channel Setting Sensing Sensitivity: 4 mV
Pulse Gen Model: 3242
Pulse Gen Serial Number: 7610861

## 2020-05-28 NOTE — Progress Notes (Signed)
Remote pacemaker transmission.   

## 2020-09-27 IMAGING — CT CT CHEST HIGH RESOLUTION W/O CM
2 of 7 series · 11 of 36 positions shown, 13 images · non-contrast
Comparison: Chest CT 04/01/2013.

CLINICAL DATA: 73-year-old female with history of persistent cough.
History of tuberculosis in 1960s treated with medications.
Sarcoidosis.

EXAM:
CT CHEST WITHOUT CONTRAST
TECHNIQUE: Multidetector CT imaging of the chest was performed following the
standard protocol without intravenous contrast. High resolution
imaging of the lungs, as well as inspiratory and expiratory imaging,
was performed.

[Series 4: inspiration hi-res 2.00 br36 s3 · coronal · 0.57mm/px · 3 of 153 slices shown]
[im 31/153  lung]
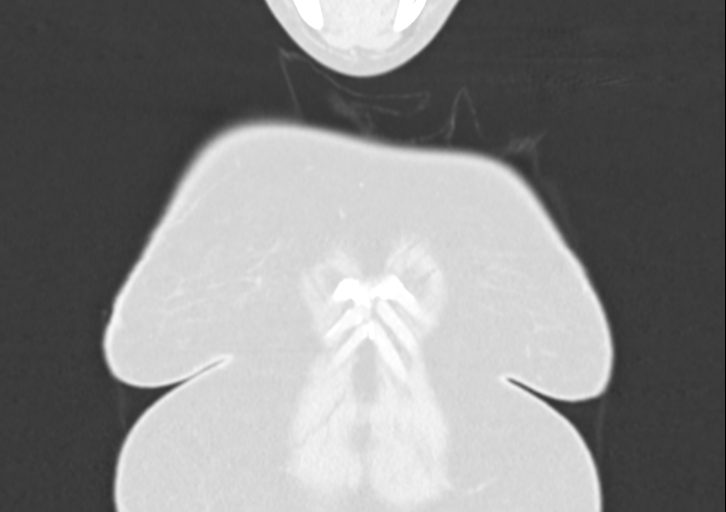
[im 61/153  lung]
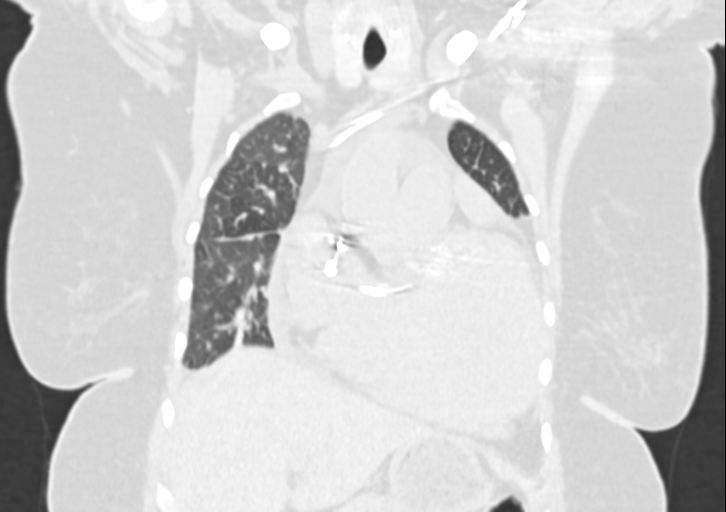
[im 92/153  lung]
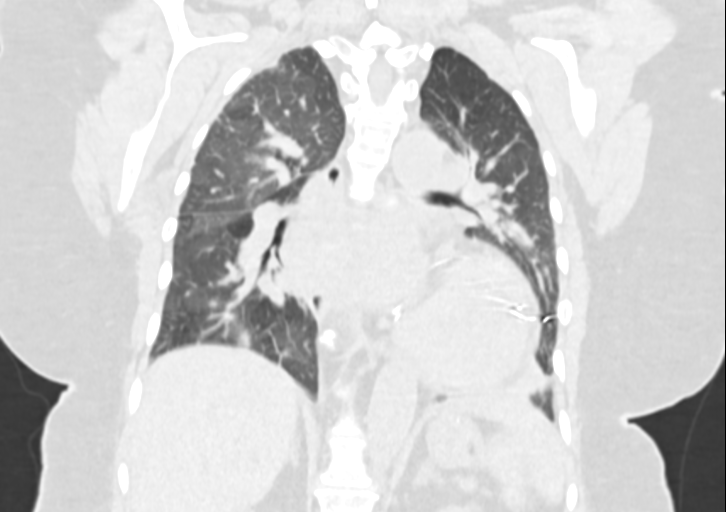

[Series 11: inspiration hi-res 1.00 br60 s3 high res thins 1x1 · axial · 0.58mm/px · z∈[+1540,+1769]mm · 8 of 275 slices shown, 10 images]
[im 23/275  mediastinal]
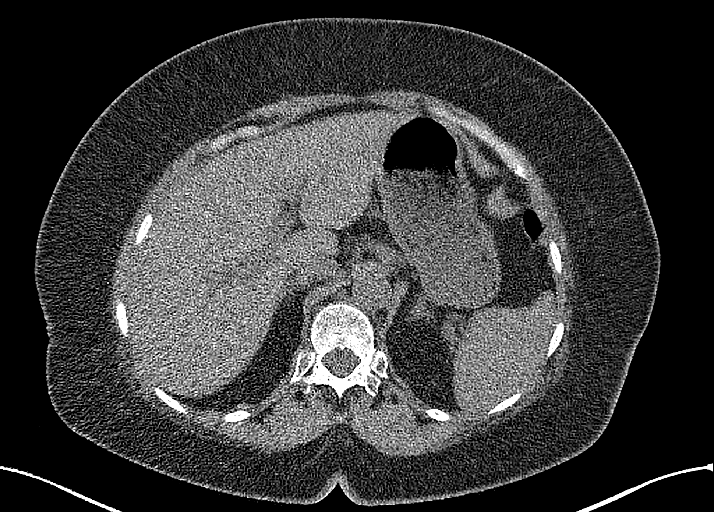
[im 23/275  lung]
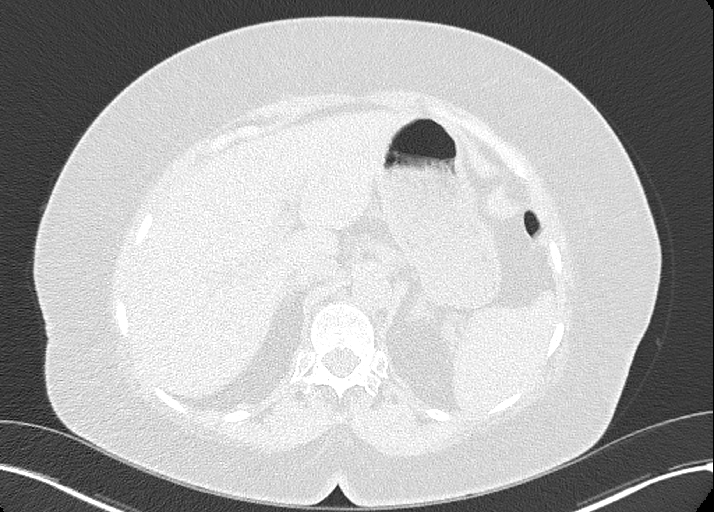
[im 69/275  lung]
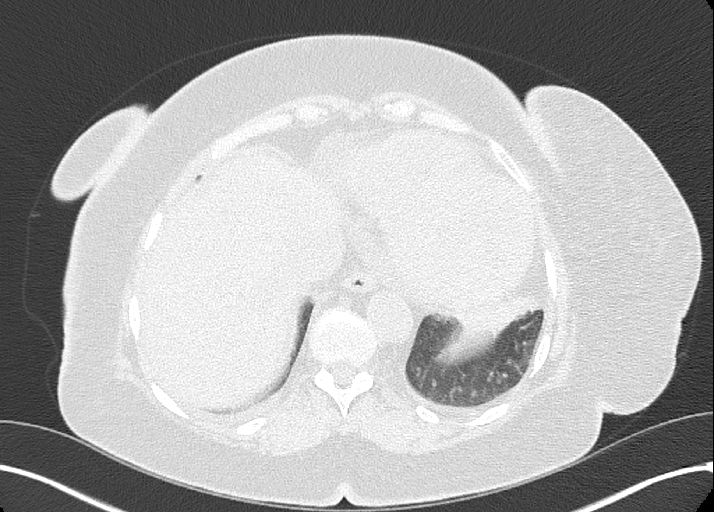
[im 92/275  lung]
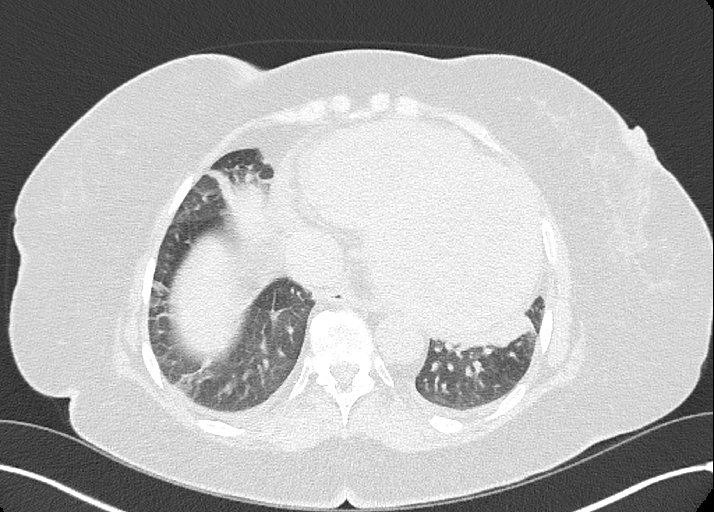
[im 115/275  lung]
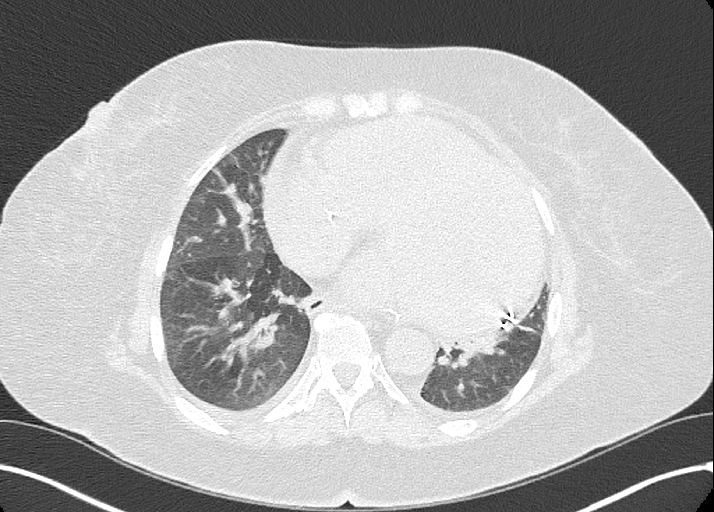
[im 160/275  mediastinal]
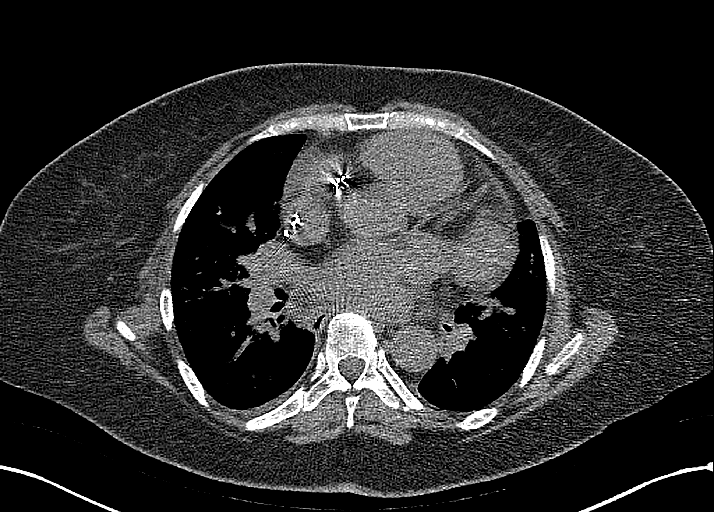
[im 160/275  lung]
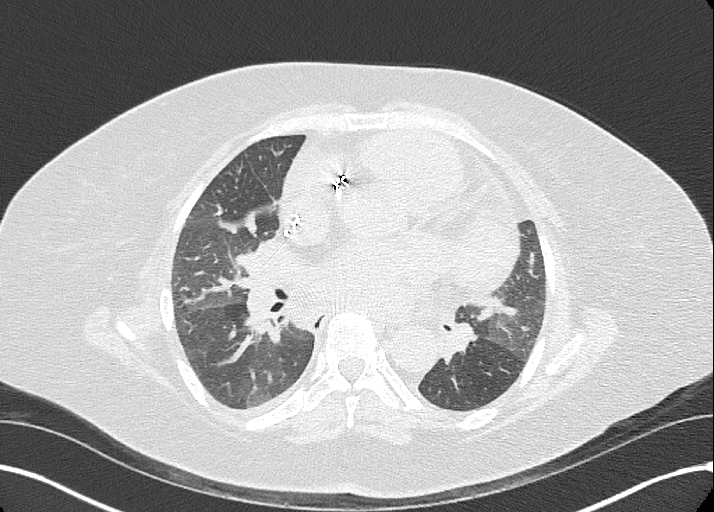
[im 183/275  lung]
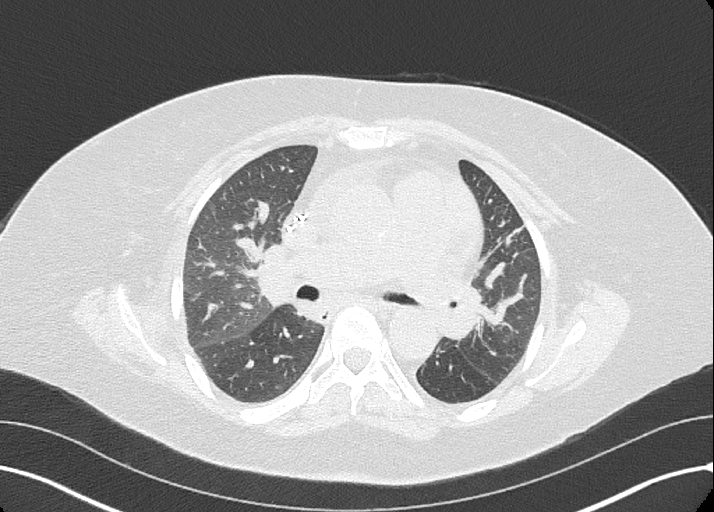
[im 206/275  lung]
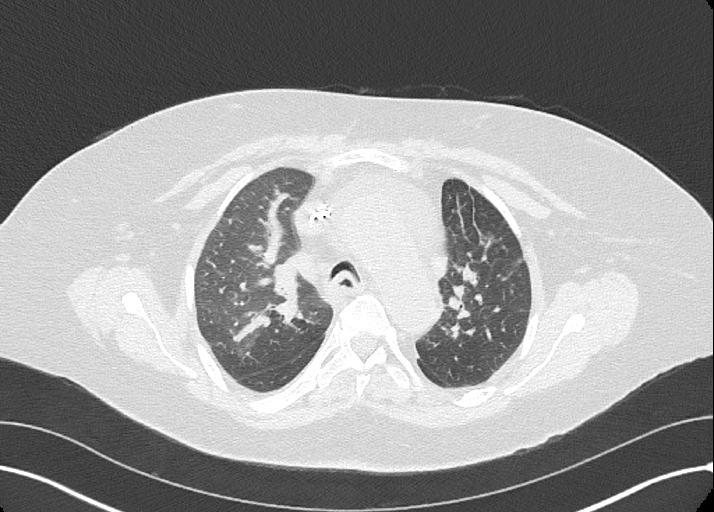
[im 252/275  lung]
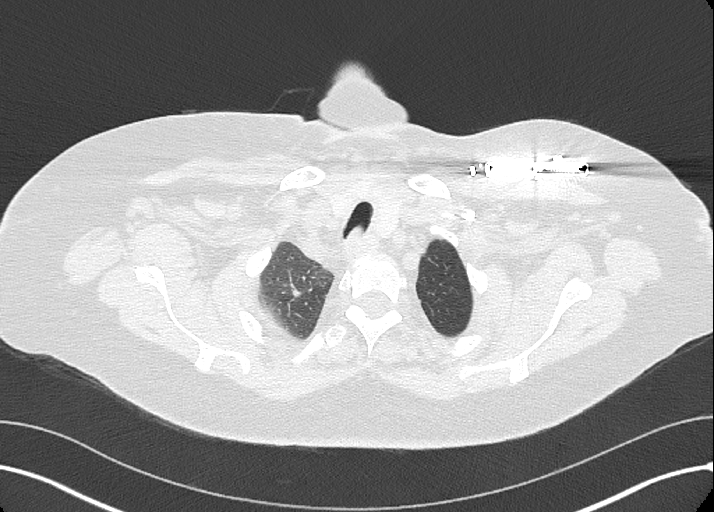

[11 of 36 positions shown; findings below may reference images not displayed]

FINDINGS: Cardiovascular: Heart size is enlarged with left ventricular
dilatation. Small amount of pericardial fluid and/or thickening,
unlikely to be of any hemodynamic significance at this time. No
pericardial calcifications. Aortic atherosclerosis. No definite
coronary artery calcifications. Left-sided biventricular
pacemaker/AICD in place, with lead tips terminating in the right
atrial appendage, right ventricular outflow tract and overlying the
lateral wall the left ventricle via the coronary sinus and coronary
veins.

Mediastinum/Nodes: No pathologically enlarged mediastinal or hilar
lymph nodes. Please note that accurate exclusion of hilar adenopathy
is limited on noncontrast CT scans. Esophagus is unremarkable in
appearance. No axillary lymphadenopathy.

Lungs/Pleura: Trace bilateral pleural effusions. Patchy areas of
ground-glass attenuation and interlobular septal thickening noted
throughout the lungs bilaterally, compatible with a background of
mild interstitial pulmonary edema. Several scattered pulmonary
nodules are noted in the lungs bilaterally, largest of which is in
the right upper lobe (axial image 31 of series 8) currently
measuring 1.2 x 0.6 cm. These are generally similar to prior
examination from 04/01/2013, presumably related to underlying
sarcoidosis. No other new suspicious appearing pulmonary nodules or
masses are noted.

Upper Abdomen: Aortic atherosclerosis. Subcentimeter low-attenuation
lesions in the liver are incompletely characterized on today's
non-contrast CT examination, but statistically likely to represent
cysts. Aortic atherosclerosis.

Musculoskeletal: There are no aggressive appearing lytic or blastic
lesions noted in the visualized portions of the skeleton.
IMPRESSION: 1. Cardiomegaly with left ventricular dilatation, trace bilateral
pleural effusions and evidence of interstitial pulmonary edema
concerning for congestive heart failure.
2. Similar multifocal nodularity throughout the mid to upper lungs
bilaterally compared to prior study from 4111, likely reflective of
underlying sarcoidosis.
3. Aortic atherosclerosis.
4. Additional incidental findings, as above.

Aortic Atherosclerosis ([id number]).

## 2023-08-24 ENCOUNTER — Encounter: Payer: Self-pay | Admitting: General Practice
